# Patient Record
Sex: Male | Born: 1940
Health system: Southern US, Community
[De-identification: ages and names within clinical notes are randomized; demographics above are authoritative.]

## PROBLEM LIST (undated history)

## (undated) ENCOUNTER — Emergency Department (HOSPITAL_COMMUNITY): Disposition: A | Discharge status: Other Acute Inpatient Hospital | Payer: Medicare Other

## (undated) DIAGNOSIS — I714 Abdominal aortic aneurysm, without rupture, unspecified: Secondary | ICD-10-CM

## (undated) DIAGNOSIS — I10 Essential (primary) hypertension: Secondary | ICD-10-CM

## (undated) DIAGNOSIS — I2510 Atherosclerotic heart disease of native coronary artery without angina pectoris: Secondary | ICD-10-CM

## (undated) DIAGNOSIS — R51 Headache: Secondary | ICD-10-CM

## (undated) DIAGNOSIS — G8929 Other chronic pain: Secondary | ICD-10-CM

## (undated) DIAGNOSIS — R519 Headache, unspecified: Secondary | ICD-10-CM

## (undated) DIAGNOSIS — T50905A Adverse effect of unspecified drugs, medicaments and biological substances, initial encounter: Secondary | ICD-10-CM

## (undated) DIAGNOSIS — G4733 Obstructive sleep apnea (adult) (pediatric): Secondary | ICD-10-CM

## (undated) DIAGNOSIS — E785 Hyperlipidemia, unspecified: Secondary | ICD-10-CM

## (undated) DIAGNOSIS — Z9989 Dependence on other enabling machines and devices: Secondary | ICD-10-CM

## (undated) DIAGNOSIS — R001 Bradycardia, unspecified: Secondary | ICD-10-CM

## (undated) DIAGNOSIS — J449 Chronic obstructive pulmonary disease, unspecified: Secondary | ICD-10-CM

## (undated) DIAGNOSIS — M549 Dorsalgia, unspecified: Secondary | ICD-10-CM

## (undated) DIAGNOSIS — I77810 Thoracic aortic ectasia: Secondary | ICD-10-CM

## (undated) DIAGNOSIS — C329 Malignant neoplasm of larynx, unspecified: Secondary | ICD-10-CM

## (undated) DIAGNOSIS — B009 Herpesviral infection, unspecified: Secondary | ICD-10-CM

## (undated) DIAGNOSIS — K219 Gastro-esophageal reflux disease without esophagitis: Secondary | ICD-10-CM

## (undated) DIAGNOSIS — M1990 Unspecified osteoarthritis, unspecified site: Secondary | ICD-10-CM

## (undated) HISTORY — PX: CARDIAC CATHETERIZATION: SHX172

## (undated) HISTORY — PX: SHOULDER ARTHROSCOPY W/ ROTATOR CUFF REPAIR: SHX2400

## (undated) HISTORY — DX: Abdominal aortic aneurysm, without rupture: I71.4

## (undated) HISTORY — DX: Abdominal aortic aneurysm, without rupture, unspecified: I71.40

## (undated) HISTORY — DX: Chronic obstructive pulmonary disease, unspecified: J44.9

## (undated) HISTORY — PX: COLONOSCOPY W/ BIOPSIES AND POLYPECTOMY: SHX1376

---

## 1958-10-01 HISTORY — PX: RHINOPLASTY: SUR1284

## 1958-10-01 HISTORY — PX: PATELLA FRACTURE SURGERY: SHX735

## 1996-10-01 DIAGNOSIS — C329 Malignant neoplasm of larynx, unspecified: Secondary | ICD-10-CM

## 1996-10-01 HISTORY — DX: Malignant neoplasm of larynx, unspecified: C32.9

## 2000-12-18 ENCOUNTER — Ambulatory Visit (HOSPITAL_BASED_OUTPATIENT_CLINIC_OR_DEPARTMENT_OTHER): Admission: RE | Admit: 2000-12-18 | Discharge: 2000-12-19 | Payer: Self-pay | Admitting: Orthopedic Surgery

## 2001-02-19 ENCOUNTER — Encounter: Payer: Self-pay | Admitting: Orthopedic Surgery

## 2001-02-19 ENCOUNTER — Encounter: Admission: RE | Admit: 2001-02-19 | Discharge: 2001-02-19 | Payer: Self-pay | Admitting: Orthopedic Surgery

## 2002-07-07 ENCOUNTER — Encounter: Payer: Self-pay | Admitting: Otolaryngology

## 2002-07-07 ENCOUNTER — Ambulatory Visit (HOSPITAL_COMMUNITY): Admission: RE | Admit: 2002-07-07 | Discharge: 2002-07-07 | Payer: Self-pay | Admitting: Otolaryngology

## 2007-03-14 ENCOUNTER — Inpatient Hospital Stay (HOSPITAL_COMMUNITY): Admission: EM | Admit: 2007-03-14 | Discharge: 2007-03-19 | Payer: Self-pay | Admitting: Emergency Medicine

## 2011-02-13 NOTE — Cardiovascular Report (Signed)
NAMEKHOURY, SIEMON               ACCOUNT NO.:  [id number]   MEDICAL RECORD NO.:  [id number]          PATIENT TYPE:  INP   LOCATION:  6531                         FACILITY:  [facility]   PHYSICIAN:  Cristy Hilts. Jacinto Halim, MD       DATE OF BIRTH:  [birth year]   DATE OF PROCEDURE:  03/17/2007  DATE OF DISCHARGE:                            CARDIAC CATHETERIZATION   PROCEDURE PERFORMED:  1. Left ventriculography.  2. Selective left coronary aortography.  3. Ascending aortogram.  4. Abdominal aortogram.  5. PTCA and balloon angioplasty of the distal circumflex coronary      artery.   INDICATIONS:  Mr. Escamilla is a 70 year old gentleman with no significant  prior cardiovascular history and who has history of hypertension,  hyperlipidemia and borderline diabetes, was admitted to the hospital  with chest pain suggestive of unstable angina.  Given this, he was  brought to the cardiac catheterization lab to evaluate his coronary  anatomy.  He has recently undergone a stress Myoview on the outpatient  basis, and this did not reveal any ischemia.   Ascending aortogram was performed, because of suspicion for aortic root  dilatation and a ascending aortic aneurysm.  In a similar fashion,  abdominal aortogram was performed for evaluation of abdominal aortic  aneurysm, given dilated aortic root.   HEMODYNAMIC DATA:  The left ventricular pressure 115/0 with the end-  diastolic 13 mmHg.  The aortic pressure 114/52 with a mean of 80 mmHg.  There was no pressure gradient across the aortic valve.   Right coronary artery is a large-caliber vessel in the proximal segment.  However, it rapidly tapers down in the mid-to-distal segment.  There is  a mildly calcific 50 to at most 60% stenosis in the mid right coronary  artery.  The right coronary artery continued as a PDA.    LEFT MAIN:  Left main is short and is normal.   CIRCUMFLEX:  Circumflex is a very large caliber vessel.  Gives origin to  a large AV groove  branch and a small-to-moderate sized OM1 and a  moderate-to-large size OM-2.  There is a 99% stenosis noted at the  junction of the obtuse marginal 2 and distal circumflex coronary artery.  The obtuse marginal 2 has a ostial 70% stenosis.   LAD:  LAD is a large caliber vessel.  Gives origin to a small diagonal  1.  After the origin of the diagonal one, the LAD is diffusely diseased  with mild to moderate amount of luminal irregularity, constituting 50%  stenosis.  In mid-segment, it gives origin to a very large diagonal 2.  Just after the origin of the diagonal 2, there is a eccentric 50 to at  most 60% stenosis in the LAD.   ASCENDING AORTOGRAM:  Ascending aortogram revealed presence of three  aortic valve cusps.  There was no significant aortic regurgitation.  There is no evidence of ascending aortic aneurysm; however, the aortic  root was mildly dilated.   ABDOMINAL  AORTOGRAM:  Abdominal aortogram revealed presence of 2 renal  arteries, one on either sides.  They were widely  patent.  There was no  evidence of abdominal aortic aneurysm.  The aorto-iliac bifurcation was  widely patent.   INTERVENTION DATA:  Successful PTCA and balloon angioplasty of the  distal circumflex coronary artery with the utilization of a 3.25 x 15-mm  Dura Star balloon at 8 atmospheres of pressures x2.  The stenosis was  reduced from 99% to less than 10% with TIMI III to TIMI III flow  maintained at the end of the procedure, without any evidence of  dissection or thrombus.  The obtuse marginal 2 stenosis which was 70% to  begin with, had increased to around 85-90%.  However, there was  excellent flow noted through this obtuse marginal 2 branch.   RECOMMENDATIONS:  The patient was left with the balloon angioplasty  results alone, given the fact there was a large obtuse marginal 2 branch  which could potentially be compromised.  There was excellent brisk flow  noted through the balloon angioplasty site.    I suspect he will need aspirin and Plavix for at least a period of 1  year for unstable angina.  Also, there is a question of bradycardia.  If  this persists, he may need a permanent transvenous pacemaker  implantation.   He recently has had a stress test, and given the fact that he has  borderline lesions in the mid-RCA and also in the LAD, and given the  fact he had high-grade stenosis in the circumflex, there could have been  a balanced ischemia.  Hence, the stress test may have been negative.  However, now having fixed the major lesion in the circumflex, could  consider repeating a outpatient Myoview to evaluate the significance of  right coronary artery and a left anterior descending artery stenosis.  Also, if the chest pain persists, consideration can be given for  angioplasty to the LAD.   A total of 190 mL of contrast was utilized for diagnostic and  interventional procedure.   PROCEDURE:  1. Under usual sterile precaution using a 6-French right femoral      arterial access, a 6-French multipurpose B2 #2 catheter was      advanced into the ascending aorta over a J-wire and into the left      ventricle.  Left ventriculography was performed, both in the LAO      and RAO projection, and then the same catheter was pulled into the      ascending aorta and right coronary artery and then the left main      coronary artery and selective engaged angiography was performed.      The catheter was then pulled into the root,and the ascending      aortogram was performed in LAO projection, then the catheter was      pulled back in the abdominal aorta and abdominal aortogram was      performed.   TECHNIQUE INFORMATION:  Exchanging the 6-French sheath to a 7-French  sheath, a 7-French FL-4.5 guide was advanced into the ascending aorta  over the J-wire, and left main coronary artery was selective engaged. Using a ATW marker guidewire, which was advanced into the circumflex  coronary artery,  using Angiomax for anticoagulation.  Lesion length was  carefully measured.  A 3.25 x 50-mm Durastar balloon was utilized, and  initially 3-atmospheric balloon angioplasty was performed for 30  seconds, as the balloon was occlusive to the site of the lesion.  Then 2  more dilatations at 8 atmospheric pressure x2, one for 60  seconds and  the other one for 30 seconds was performed, and a nitroglycerin was  administered.  Angiography was performed.  Excellent results were noted.  There was a snowplow and watermelon seed effect into the obtuse marginal  2 branch with increasing the stenosis from 70% to 80-90%.  However,  there was excellent flow noted.  The lesion was carefully analyzed and  felt that this could probably be left alone.  I did discuss the findings  with Dr. Elsie Lincoln, who also agreed with my findings.  The  patient was then transferred to the recovery in stable condition, after  taking the guide catheter and the guide wire out.  I started the  Integralin to give adequate anti-platelet effect, given the fact that  this was only a balloon angioplasty result.  No immediate complications  were noted.      Cristy Hilts. Jacinto Halim, MD  Electronically Signed     JRG/MEDQ  D:  03/17/2007  T:  03/17/2007  Job:  [id number]   cc:   Christeen Douglas, MD, [facility]  [name], M.D.
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
## 2011-02-13 NOTE — Discharge Summary (Signed)
[name], [name]               ACCOUNT NO.:  [id number]   MEDICAL RECORD NO.:  [id number]          PATIENT TYPE:  INP   LOCATION:  6531                         FACILITY:  [facility]   PHYSICIAN:  [name], M.D.DATE OF BIRTH:  September 16, 1941   DATE OF ADMISSION:  03/14/2007  DATE OF DISCHARGE:  03/19/2007                               DISCHARGE SUMMARY   DISCHARGE DIAGNOSES:  1. Unstable angina pectoris status post angioplasty to circumflex.      The patient still has residual disease in the right coronary artery      and left anterior descending.  Needs outpatient Myoview stress test      to assess the myocardial perfusion.  2. Hypertension.  3. Hyperlipidemia.  4. Gastroesophageal reflux disease.  5. History of laryngeal carcinoma treated with radiation therapy.   This is a 70 year old Caucasian gentleman with previous history of  coronary artery disease who has been followed by Dr. Alanda Smith in our  office, and he presented to the emergency room at [facility]  with complaints of chest pain.  Initially, the pain started as a  bilateral shoulder discomfort which was exertional while the patient was  working in the yard.  He stopped the activity, came back home and took  hydrocodone that relieved the discomfort.  Later the same night, the  patient experienced another episode of bilateral shoulder pain which  occurred at rest.  He took another hydrocodone and went to bed, but  could not sleep well through the night.  In the early morning, he was  awakened from sleep with chest pain, and at this time the pain radiated  to the shoulders and he was really short of breath and sweaty and weak.  The patient asked his wife to take him to the emergency room and we saw  him there.  We cycled his enzymes which were negative x3, and the  patient was scheduled for the coronary angiography on Monday, March 17, 2007.  The cath revealed a three vessel lesion and distal circumflex  had  99% stenosis.  Dr. Jacinto Smith performed the angioplasty with reduction of the  lesion to less than 10%.  The patient tolerated the procedure well and  was assessed the next day by Dr. Jacinto Smith who decided to keep him one more  day for observation because the patient developed an episode of  bradycardia of unknown etiology.  There were no more arrhythmias  observed on telemetry, and the following day the patient was discharged  home in stable condition.   HOSPITAL LABORATORIES:  Hemoglobin of 13.3, hematocrit 59.9, white blood  cell count 5.9, platelets 281.  Sodium 133, potassium 4.2, chloride 100,  CO2 of 26, BUN 12, creatinine 0.89, glucose 107.  Cardiac enzymes were  negative x3.   DISCHARGE INSTRUCTIONS:  1. Low-fat, low-salt, low-cholesterol diet.  2  Keep groin site dry, clean.  Report any problems with groin puncture  site.   DISCHARGE MEDICATIONS:  1. Diovan 80 mg daily.  2. Protonix 40 mg or Prilosec 20 mg daily.  3. Aspirin 325 mg daily.  4.  Potassium 20 mEq daily.  5. Singulair 10 mg daily.  6. Ativan 1 mg daily.  7. Plavix 75 mg daily.  8. Nitroglycerin sublingual as needed for chest pain.  9. Norvasc 5 mg daily.  (The patient was instructed to discontinue hydrochlorothiazide.)   DISCHARGE FOLLOWUP:  He will be seen by Dr. Alanda Smith in our office on  March 27, 2007 at 9:15 and the recommendations will be made at that time.      [name], P.A.      [name], M.D.  Electronically Signed    MK/MEDQ  D:  03/19/2007  T:  03/19/2007  Job:  [id number]   cc:   [facility]

## 2011-02-13 NOTE — Discharge Summary (Signed)
NAMEMIKIAS, LANZ               ACCOUNT NO.:  [id number]   MEDICAL RECORD NO.:  [id number]          PATIENT TYPE:  INP   LOCATION:  6531                         FACILITY:  [facility]   PHYSICIAN:  [name], P.A. DATE OF BIRTH:  20-Jul-1941   DATE OF ADMISSION:  03/14/2007  DATE OF DISCHARGE:  03/19/2007                               DISCHARGE SUMMARY   DISCHARGE DIAGNOSES:  1. Unstable angina pectoris status post angioplasty to circumflex this      admission.  2. Non-coronary artery disease still with residual left anterior      descending and right coronary artery lesions, need to get patient's      Cardiolite stress test to rule out ischemia in those regions.  3. Hypertension.  4. Gastroesophageal reflux disease.  5. Hyperlipidemia.  6. Laryngeal carcinoma treated with radiation.   DICTATION ENDS HERE      [name], P.A.     MK/MEDQ  D:  03/19/2007  T:  03/19/2007  Job:  [id number]

## 2011-02-16 NOTE — Op Note (Signed)
. [facility]  Patient:    [name], [name]                        MRN: [id number] Proc. Date: 12/18/00 Adm. Date:  12/18/00 Attending:  Sharlot Gowda., M.D.                           Operative Report  INDICATIONS:  A 70 year old with severe shoulder pain, partial rotator cuff tear, AC joint arthritis impingement thought to be amenable to overnight hospitalization.  PREOPERATIVE DIAGNOSES: 1. Impingement with severe AC arthritis. 2. Partial rotator cuff tear. 3. Degenerative tear of anterosuperior labrum.  POSTOPERATIVE DIAGNOSES: 1. Impingement with severe AC arthritis. 2. Partial rotator cuff tear. 3. Degenerative tear of anterosuperior labrum.  PROCEDURE: 1. Arthroscopic acromioplasty. 2. Interarticular debridement torn labrum. 3. Open distal clavicle excision.  SURGEON:  Sharlot Gowda., M.D.  ASSISTANT:  Arnoldo Morale, P.A.  DESCRIPTION OF PROCEDURE:  Arthroscope through posterolateral and anterior portals.  Inspection of the shoulder showed the patient to have no glenohumeral arthritis.  He had a partial thickness tear encroachment but not quite 50% of the thickness of the supraspinatus was debrided.  He had degenerative tearing of the labrum which was debrided intra-articularly.  The subacromial space was inflamed.  The acromion demonstrated moderately severe curvature.  Approximately 7-8 mm of bone was resected from the anterior ______ of the acromion and gently ______ posteriorly.  Preoperative evaluation and x-ray showed severe hypertrophic changes of the [facility] joint, point in fact, there was significant anterior and superior projections of the clavicle and it was my thought that it would be difficult to resect the bone, because a portion of the bone actually projected significantly in front of the acromion as well as superior to the top of the acromion.  For this reason the procedure was converted to an open procedure  and due to the extreme hypertrophy of the [facility] joint incision was made over about 5 cm.  The deltoid fascia was carefully dissected longitudinally at deltotrapezial fascia revealing a extremely hypertrophied AC joint, point in fact, there was almost a slope to it with osteolysis on the inferior portion of the clavicle and hypertrophy superiorly.  The bone was resected.  Small osteophytes were removed from the undersurface of the clavicle but the deltoid did not have to be attached from the anterior aspect of the acromion.  The distal clavicle was excised followed by irrigation, closure with 0 Tycron of the deltotrapezial fascia, 2-0 Vicryl on the subcutaneous tissues and skin clips on the skin.  10 cc of Marcaine infiltrated into the skin, lightly compressive sterile dressing and sling applied and taken to recovery room in stable condition.  y DD:  12/18/00 TD:  12/19/00 Job: [id number]

## 2011-07-18 LAB — CBC
HCT: 43.7
HCT: 44.1
HCT: 45.2
Hemoglobin: 13.3
Hemoglobin: 14.7
Hemoglobin: 14.9
Hemoglobin: 15
Hemoglobin: 15.1
MCHC: 33.4
MCHC: 33.4
MCHC: 33.5
MCHC: 33.7
MCV: 87.7
MCV: 87.8
Platelets: 316
Platelets: 316
RBC: 4.46
RBC: 4.98
RBC: 5.02
RBC: 5.13
RDW: 13.9
RDW: 14.1 — ABNORMAL HIGH
RDW: 14.3 — ABNORMAL HIGH
WBC: 4.9
WBC: 5.7
WBC: 6.7
WBC: 7.8

## 2011-07-18 LAB — BASIC METABOLIC PANEL
BUN: 11
BUN: 18
CO2: 27
CO2: 30
Calcium: 9.1
Calcium: 9.2
Calcium: 9.6
Chloride: 100
Chloride: 102
Creatinine, Ser: 0.94
Creatinine, Ser: 0.97
GFR calc Af Amer: >60
GFR calc Af Amer: >60
GFR calc Af Amer: >60
GFR calc non Af Amer: >60
GFR calc non Af Amer: >60
GFR calc non Af Amer: >60
Glucose, Bld: 100 — ABNORMAL HIGH
Glucose, Bld: 98
Potassium: 3.5
Potassium: 3.8
Potassium: 4.2
Sodium: 132 — ABNORMAL LOW
Sodium: 133 — ABNORMAL LOW
Sodium: 139

## 2011-07-18 LAB — CARDIAC PANEL(CRET KIN+CKTOT+MB+TROPI)
CK, MB: 2.3
CK, MB: 2.4
Relative Index: 1.8
Relative Index: INVALID
Total CK: 133
Total CK: 86
Troponin I: 0.04

## 2011-07-18 LAB — DIFFERENTIAL
Lymphocytes Relative: 37
Lymphs Abs: 1.8
Monocytes Absolute: 0.4
Monocytes Relative: 7
Neutro Abs: 2.7

## 2011-07-18 LAB — CK TOTAL AND CKMB (NOT AT ARMC)
CK, MB: 2
Total CK: 87

## 2011-07-18 LAB — TROPONIN I: Troponin I: 0.06

## 2011-07-18 LAB — PROTIME-INR
INR: 1
Prothrombin Time: 12.9

## 2011-07-18 LAB — APTT: aPTT: 46 — ABNORMAL HIGH

## 2011-07-19 LAB — COMPREHENSIVE METABOLIC PANEL WITH GFR
ALT: 9
AST: 19
Albumin: 3.5
Alkaline Phosphatase: 58
BUN: 10
CO2: 28
Calcium: 9.1
Chloride: 104
Creatinine, Ser: 0.85
GFR calc non Af Amer: >60
Glucose, Bld: 113 — ABNORMAL HIGH
Potassium: 4
Sodium: 136
Total Bilirubin: 1.2
Total Protein: 6

## 2011-07-19 LAB — URINALYSIS, ROUTINE W REFLEX MICROSCOPIC
Nitrite: NEGATIVE
Protein, ur: NEGATIVE
Specific Gravity, Urine: 1.012

## 2011-07-19 LAB — I-STAT 8, (EC8 V) (CONVERTED LAB)
Acid-base deficit: 1
BUN: 13
Bicarbonate: 26.8 — ABNORMAL HIGH
Chloride: 105
Glucose, Bld: 83
HCT: 43
Hemoglobin: 14.6
Operator id: 161631
Potassium: 4.1
Sodium: 138
TCO2: 29
pCO2, Ven: 57.1 — ABNORMAL HIGH
pH, Ven: 7.28

## 2011-07-19 LAB — CARDIAC PANEL(CRET KIN+CKTOT+MB+TROPI)
CK, MB: 2.5
CK, MB: 3.6
Relative Index: 2.2
Relative Index: 2.7 — ABNORMAL HIGH
Relative Index: 2.7 — ABNORMAL HIGH
Total CK: 133
Troponin I: 0.03
Troponin I: 0.04

## 2011-07-19 LAB — BASIC METABOLIC PANEL
GFR calc non Af Amer: >60
Potassium: 4.2
Sodium: 141

## 2011-07-19 LAB — LIPID PANEL
Cholesterol: 132
LDL Cholesterol: 77
Total CHOL/HDL Ratio: 3.7
Triglycerides: 94
VLDL: 19

## 2011-07-19 LAB — POCT CARDIAC MARKERS
CKMB, poc: 1.5
Myoglobin, poc: 81.5
Operator id: 161631
Troponin i, poc: <0.05

## 2011-07-19 LAB — CBC
HCT: 41.3
HCT: 42.1
Hemoglobin: 13.8
Hemoglobin: 13.9
MCHC: 32.8
MCV: 90
Platelets: 303
RBC: 4.63
RBC: 4.68
RDW: 14.6 — ABNORMAL HIGH
WBC: 5.5
WBC: 6.4

## 2011-07-19 LAB — POCT I-STAT CREATININE
Creatinine, Ser: 1
Operator id: 161631

## 2011-07-19 LAB — PROTIME-INR: INR: 1.1

## 2011-11-04 ENCOUNTER — Encounter (HOSPITAL_COMMUNITY): Payer: Self-pay | Admitting: *Deleted

## 2011-11-04 ENCOUNTER — Observation Stay (HOSPITAL_COMMUNITY)
Admission: AD | Admit: 2011-11-04 | Discharge: 2011-11-06 | Disposition: A | Discharge status: Home / Self Care | Payer: Medicare Other | Source: Other Acute Inpatient Hospital | Attending: Cardiovascular Disease | Admitting: Cardiovascular Disease

## 2011-11-04 ENCOUNTER — Other Ambulatory Visit: Payer: Self-pay

## 2011-11-04 DIAGNOSIS — E785 Hyperlipidemia, unspecified: Secondary | ICD-10-CM | POA: Diagnosis present

## 2011-11-04 DIAGNOSIS — M129 Arthropathy, unspecified: Secondary | ICD-10-CM | POA: Insufficient documentation

## 2011-11-04 DIAGNOSIS — R079 Chest pain, unspecified: Secondary | ICD-10-CM

## 2011-11-04 DIAGNOSIS — I2089 Other forms of angina pectoris: Secondary | ICD-10-CM | POA: Diagnosis present

## 2011-11-04 DIAGNOSIS — I10 Essential (primary) hypertension: Secondary | ICD-10-CM | POA: Diagnosis present

## 2011-11-04 DIAGNOSIS — I208 Other forms of angina pectoris: Secondary | ICD-10-CM | POA: Diagnosis present

## 2011-11-04 DIAGNOSIS — T50905A Adverse effect of unspecified drugs, medicaments and biological substances, initial encounter: Secondary | ICD-10-CM

## 2011-11-04 DIAGNOSIS — R001 Bradycardia, unspecified: Secondary | ICD-10-CM | POA: Diagnosis present

## 2011-11-04 DIAGNOSIS — I2510 Atherosclerotic heart disease of native coronary artery without angina pectoris: Secondary | ICD-10-CM | POA: Diagnosis present

## 2011-11-04 DIAGNOSIS — R748 Abnormal levels of other serum enzymes: Secondary | ICD-10-CM

## 2011-11-04 DIAGNOSIS — Z8521 Personal history of malignant neoplasm of larynx: Secondary | ICD-10-CM

## 2011-11-04 DIAGNOSIS — I498 Other specified cardiac arrhythmias: Secondary | ICD-10-CM | POA: Insufficient documentation

## 2011-11-04 DIAGNOSIS — G4730 Sleep apnea, unspecified: Secondary | ICD-10-CM | POA: Insufficient documentation

## 2011-11-04 DIAGNOSIS — Z9861 Coronary angioplasty status: Secondary | ICD-10-CM | POA: Insufficient documentation

## 2011-11-04 DIAGNOSIS — I77810 Thoracic aortic ectasia: Secondary | ICD-10-CM | POA: Diagnosis present

## 2011-11-04 DIAGNOSIS — K219 Gastro-esophageal reflux disease without esophagitis: Secondary | ICD-10-CM | POA: Insufficient documentation

## 2011-11-04 HISTORY — DX: Essential (primary) hypertension: I10

## 2011-11-04 HISTORY — DX: Gastro-esophageal reflux disease without esophagitis: K21.9

## 2011-11-04 HISTORY — DX: Hyperlipidemia, unspecified: E78.5

## 2011-11-04 HISTORY — DX: Bradycardia, unspecified: R00.1

## 2011-11-04 HISTORY — DX: Unspecified osteoarthritis, unspecified site: M19.90

## 2011-11-04 HISTORY — DX: Adverse effect of unspecified drugs, medicaments and biological substances, initial encounter: T50.905A

## 2011-11-04 HISTORY — DX: Thoracic aortic ectasia: I77.810

## 2011-11-04 LAB — CBC
HCT: 39.8 % (ref 39.0–52.0)
Hemoglobin: 13.5 g/dL (ref 13.0–17.0)
MCH: 29.7 pg (ref 26.0–34.0)
MCHC: 33.9 g/dL (ref 30.0–36.0)
RDW: 13.4 % (ref 11.5–15.5)

## 2011-11-04 LAB — COMPREHENSIVE METABOLIC PANEL
ALT: 13 U/L (ref 0–53)
AST: 13 U/L (ref 0–37)
Albumin: 3.4 g/dL — ABNORMAL LOW (ref 3.5–5.2)
Alkaline Phosphatase: 74 U/L (ref 39–117)
Chloride: 102 mEq/L (ref 96–112)
Creatinine, Ser: 0.9 mg/dL (ref 0.50–1.35)
Potassium: 3.8 mEq/L (ref 3.5–5.1)
Sodium: 140 mEq/L (ref 135–145)
Total Bilirubin: 0.6 mg/dL (ref 0.3–1.2)

## 2011-11-04 LAB — URINALYSIS, ROUTINE W REFLEX MICROSCOPIC
Bilirubin Urine: NEGATIVE
Ketones, ur: NEGATIVE mg/dL
Nitrite: NEGATIVE
pH: 6 (ref 5.0–8.0)

## 2011-11-04 LAB — PROTIME-INR: Prothrombin Time: 13.5 seconds (ref 11.6–15.2)

## 2011-11-04 LAB — MAGNESIUM: Magnesium: 2.1 mg/dL (ref 1.5–2.5)

## 2011-11-04 LAB — CARDIAC PANEL(CRET KIN+CKTOT+MB+TROPI)
CK, MB: 2.5 ng/mL (ref 0.3–4.0)
Relative Index: INVALID (ref 0.0–2.5)
Total CK: 76 U/L (ref 7–232)
Troponin I: <0.3 ng/mL (ref <0.30)

## 2011-11-04 LAB — CREATININE, SERUM
GFR calc Af Amer: >90 mL/min (ref >90)
GFR calc non Af Amer: 84 mL/min — ABNORMAL LOW (ref >90)

## 2011-11-04 MED ORDER — ENOXAPARIN SODIUM 100 MG/ML ~~LOC~~ SOLN
100.0000 mg | Freq: Two times a day (BID) | SUBCUTANEOUS | Status: DC
  Administered 2011-11-04: 100 mg via SUBCUTANEOUS
  Filled 2011-11-04 (×3): qty 1

## 2011-11-04 MED ORDER — ISOSORBIDE MONONITRATE 15 MG HALF TABLET
15.0000 mg | ORAL_TABLET | Freq: Every day | ORAL | Status: DC
  Administered 2011-11-05: 15 mg via ORAL
  Filled 2011-11-04 (×2): qty 1

## 2011-11-04 MED ORDER — FLUTICASONE PROPIONATE 50 MCG/ACT NA SUSP
2.0000 | Freq: Every day | NASAL | Status: DC
  Administered 2011-11-04 – 2011-11-06 (×2): 2 via NASAL
  Filled 2011-11-04: qty 16

## 2011-11-04 MED ORDER — ENOXAPARIN SODIUM 100 MG/ML ~~LOC~~ SOLN
100.0000 mg | Freq: Two times a day (BID) | SUBCUTANEOUS | Status: DC
  Filled 2011-11-04 (×2): qty 1

## 2011-11-04 MED ORDER — NITROGLYCERIN 0.4 MG SL SUBL
0.4000 mg | SUBLINGUAL_TABLET | SUBLINGUAL | Status: DC | PRN
  Administered 2011-11-04 (×2): 0.4 mg via SUBLINGUAL
  Filled 2011-11-04: qty 25

## 2011-11-04 MED ORDER — ASPIRIN 300 MG RE SUPP
300.0000 mg | RECTAL | Status: DC
  Filled 2011-11-04: qty 1

## 2011-11-04 MED ORDER — ASPIRIN 81 MG PO CHEW
162.0000 mg | CHEWABLE_TABLET | Freq: Once | ORAL | Status: AC
  Administered 2011-11-04: 162 mg via ORAL
  Filled 2011-11-04: qty 2

## 2011-11-04 MED ORDER — OLMESARTAN 10 MG HALF TABLET
10.0000 mg | ORAL_TABLET | Freq: Every day | ORAL | Status: DC
  Administered 2011-11-04 – 2011-11-06 (×3): 10 mg via ORAL
  Filled 2011-11-04 (×3): qty 1

## 2011-11-04 MED ORDER — ASPIRIN 81 MG PO CHEW: 324.0000 mg | CHEWABLE_TABLET | ORAL | Status: DC

## 2011-11-04 MED ORDER — ASPIRIN 81 MG PO CHEW
324.0000 mg | CHEWABLE_TABLET | ORAL | Status: AC
  Administered 2011-11-05: 324 mg via ORAL
  Filled 2011-11-04: qty 4

## 2011-11-04 MED ORDER — SODIUM CHLORIDE 0.9 % IV SOLN: 250.0000 mL | INTRAVENOUS | Status: DC | PRN

## 2011-11-04 MED ORDER — DIAZEPAM 5 MG PO TABS
5.0000 mg | ORAL_TABLET | ORAL | Status: AC
  Administered 2011-11-05: 5 mg via ORAL
  Filled 2011-11-04: qty 1

## 2011-11-04 MED ORDER — SODIUM CHLORIDE 0.9 % IV SOLN
1.0000 mL/kg/h | INTRAVENOUS | Status: DC
  Administered 2011-11-05: 1 mL/kg/h via INTRAVENOUS

## 2011-11-04 MED ORDER — SODIUM CHLORIDE 0.9 % IJ SOLN: 3.0000 mL | Freq: Two times a day (BID) | INTRAMUSCULAR | Status: DC

## 2011-11-04 MED ORDER — LORATADINE 10 MG PO TABS
10.0000 mg | ORAL_TABLET | Freq: Every day | ORAL | Status: DC
  Filled 2011-11-04: qty 1

## 2011-11-04 MED ORDER — SODIUM CHLORIDE 0.9 % IJ SOLN: 3.0000 mL | INTRAMUSCULAR | Status: DC | PRN

## 2011-11-04 MED ORDER — SIMVASTATIN 40 MG PO TABS
40.0000 mg | ORAL_TABLET | Freq: Every day | ORAL | Status: DC
  Administered 2011-11-04 – 2011-11-05 (×2): 40 mg via ORAL
  Filled 2011-11-04 (×3): qty 1

## 2011-11-04 MED ORDER — ASPIRIN EC 325 MG PO TBEC
325.0000 mg | DELAYED_RELEASE_TABLET | Freq: Every day | ORAL | Status: DC
  Filled 2011-11-04 (×2): qty 1

## 2011-11-04 MED ORDER — ZOLPIDEM TARTRATE 5 MG PO TABS: 5.0000 mg | ORAL_TABLET | Freq: Every evening | ORAL | Status: DC | PRN

## 2011-11-04 MED ORDER — ASPIRIN 325 MG PO TABS
325.0000 mg | ORAL_TABLET | Freq: Every day | ORAL | Status: DC
  Filled 2011-11-04: qty 1

## 2011-11-04 MED ORDER — ASPIRIN EC 81 MG PO TBEC: 81.0000 mg | DELAYED_RELEASE_TABLET | Freq: Every day | ORAL | Status: DC

## 2011-11-04 MED ORDER — METOPROLOL TARTRATE 12.5 MG HALF TABLET
12.5000 mg | ORAL_TABLET | Freq: Two times a day (BID) | ORAL | Status: DC
  Administered 2011-11-04 – 2011-11-06 (×3): 12.5 mg via ORAL
  Filled 2011-11-04 (×5): qty 1

## 2011-11-04 MED ORDER — SODIUM CHLORIDE 0.9 % IJ SOLN
3.0000 mL | Freq: Two times a day (BID) | INTRAMUSCULAR | Status: DC
  Administered 2011-11-04: 3 mL via INTRAVENOUS

## 2011-11-04 MED ORDER — ONDANSETRON HCL 4 MG/2ML IJ SOLN: 4.0000 mg | Freq: Four times a day (QID) | INTRAMUSCULAR | Status: DC | PRN

## 2011-11-04 MED ORDER — LORATADINE 10 MG PO TABS
10.0000 mg | ORAL_TABLET | Freq: Every day | ORAL | Status: DC
  Administered 2011-11-05 – 2011-11-06 (×2): 10 mg via ORAL
  Filled 2011-11-04 (×3): qty 1

## 2011-11-04 MED ORDER — LORAZEPAM 1 MG PO TABS
1.0000 mg | ORAL_TABLET | Freq: Every day | ORAL | Status: DC | PRN
  Administered 2011-11-04: 1 mg via ORAL
  Filled 2011-11-04: qty 1

## 2011-11-04 MED ORDER — ACETAMINOPHEN 325 MG PO TABS
650.0000 mg | ORAL_TABLET | ORAL | Status: DC | PRN
  Administered 2011-11-04: 650 mg via ORAL
  Filled 2011-11-04: qty 2

## 2011-11-04 NOTE — H&P (Signed)
[name] is an 71 y.o. male.   Chief Complaint: chest pain HPI: 71 year old white, married male presented to [facility] 11/02/2011 with complaints of chest pain. He has known coronary artery disease with last cardiac catheterization 03/17/2007.  At that time he had had a negative nuclear stress test secondary to balanced ischemia. At cardiac cath he was found to have 50-60% RCA stenosis LAD was diffusely diseased with mild to moderate amount of luminal irregularity constituting 50% stenosis and after the origin of the diagonal 2 there was an eccentric 50-60% stenosis of the LAD. The circumflex was a very large caliber vessel and there was a 99% stenosis noted at the junction of the obtuse marginal 2 and distal circumflex artery with the obtuse marginal to have an ostial 70% stenosis. Patient underwent PTCA of the distal circumflex and the stenosis was reduced from 99% to less than 10% and obtuse marginal 2 stenosis which had been 70% head increased to 85-90% however there was excellent flow noted through this obtuse marginal 2 branch. Plans were to do an outpatient stress test now that the circumflex stenosis was resolved to see if that LAD stenosis was also ischemic. I will have to obtain those records from our office.  Also in 2008 at the time of the cardiac cath he had a dilated aortic root that was evaluated at cardiac cath. There was no evidence of ascending aortic aneurysm; however, the aortic root was mildly dilated.   Search this time patient has been followed at the [place] [facility]. He does not remember having any more stress test.   Patient does admit that over the last couple of years he will have chest pain at rest and will take sublingual nitroglycerin with relief of the pain. This may happen 2 or 3 times a month and then he may go to 3 months without any chest pain.    On 11/02/2011 patient stated he thought he was having a heart attack. He had a bilateral tightness to his chest, he  was lightheaded, with this his blood pressure had become very elevated with systolic  pressure in the 200s he felt very hot, he did have nausea but no shortness of breath and he never had diaphoresis. He was afraid to try his nitroglycerin because his blood pressure was very high.    His cardiac enzymes warm and mildly elevated and after evaluation by cardiologist Dr. Laney Potash it was recommended he have cardiac catheterization.  Currently here at [facility] he has no chest pain no shortness of breath.  Please note while he was at [facility] he was placed on beta blocker and his heart rate dropped down into the 40s, on old records from this hospital he also had bradycardia. Currently sinus rhythm without acute changes on EKG.   Past Medical History  Diagnosis Date  . Coronary artery disease   . Hypertension   . Angina   . Cancer 1998    throat  . GERD (gastroesophageal reflux disease)   . Arthritis   . Shortness of breath   . Sleep apnea     does not use machine as directed  . Hyperlipidemia   . Angina at rest 11/04/2011  . CAD (coronary artery disease), history of PTCA of LCX in 2008 and 50% stenosis of RCA at that time 11/04/2011  . HTN (hypertension) 11/04/2011  . Hyperlipemia 11/04/2011  . Bradycardia, drug induced 11/04/2011  . H/O laryngeal cancer, treated with radiation therapy 11/04/2011  . Aortic  root dilatation, history of in 2008 11/04/2011    Past Surgical History  Procedure Date  . Cardiac catheterization   . Patella fracture surgery     History reviewed. No pertinent family history. Social History:  reports that he quit smoking about 14 years ago. He does not have any smokeless tobacco history on file. He reports that he does not drink alcohol or use illicit drugs.he lives with his wife, is not very active secondary to chronic knee pain and lower back pain.  Allergies: No Known Allergies  Medications Prior to Admission  Medication Dose Route Frequency Provider Last Rate Last  Dose  . 0.9 %  sodium chloride infusion  250 mL Intravenous PRN Leone Brand, NP      . 0.9 %  sodium chloride infusion  1 mL/kg/hr Intravenous Continuous Leone Brand, NP      . acetaminophen (TYLENOL) tablet 650 mg  650 mg Oral Q4H PRN Leone Brand, NP      . aspirin chewable tablet 324 mg  324 mg Oral NOW Leone Brand, NP       Or  . aspirin suppository 300 mg  300 mg Rectal NOW Leone Brand, NP      . aspirin chewable tablet 324 mg  324 mg Oral Pre-Cath Leone Brand, NP      . aspirin EC tablet 81 mg  81 mg Oral Daily Leone Brand, NP      . diazepam (VALIUM) tablet 5 mg  5 mg Oral On Call Leone Brand, NP      . enoxaparin (LOVENOX) injection 100 mg  100 mg Subcutaneous Q12H Crystal Stillinger Merilynn Finland, PHARMD      . fluticasone (FLONASE) 50 MCG/ACT nasal spray 2 spray  2 spray Each Nare Daily Leone Brand, NP      . loratadine (CLARITIN) tablet 10 mg  10 mg Oral Daily Leone Brand, NP      . LORazepam (ATIVAN) tablet 1 mg  1 mg Oral Daily PRN Leone Brand, NP      . metoprolol tartrate (LOPRESSOR) tablet 12.5 mg  12.5 mg Oral BID Leone Brand, NP      . nitroGLYCERIN (NITROSTAT) SL tablet 0.4 mg  0.4 mg Sublingual Q5 Min x 3 PRN Leone Brand, NP      . olmesartan (BENICAR) tablet 10 mg  10 mg Oral Daily Leone Brand, NP      . ondansetron [facility]) injection 4 mg  4 mg Intravenous Q6H PRN Leone Brand, NP      . simvastatin (ZOCOR) tablet 40 mg  40 mg Oral q1800 Leone Brand, NP      . sodium chloride 0.9 % injection 3 mL  3 mL Intravenous Q12H Leone Brand, NP      . sodium chloride 0.9 % injection 3 mL  3 mL Intravenous PRN Leone Brand, NP      . zolpidem (AMBIEN) tablet 5 mg  5 mg Oral QHS PRN Leone Brand, NP      . DISCONTD: 0.9 %  sodium chloride infusion  250 mL Intravenous PRN Leone Brand, NP      . DISCONTD: aspirin EC tablet 325 mg  325 mg Oral Daily Leone Brand, NP      . DISCONTD: enoxaparin (LOVENOX) injection 100 mg  100 mg  Subcutaneous Q12H Leone Brand, NP      . DISCONTD: sodium  chloride 0.9 % injection 3 mL  3 mL Intravenous Q12H Leone Brand, NP      . DISCONTD: sodium chloride 0.9 % injection 3 mL  3 mL Intravenous PRN Leone Brand, NP       No current outpatient prescriptions on file as of 11/04/2011.   Outpatient Medications:  Aspirin 325 mg daily enteric-coated, Zyrtec 10 mg daily, Lodine 400 mg twice a day as needed for in near back pain, Flonase 50 mics 2 sprays into the nose daily, Ativan 1 mg daily as needed for sleep, and Diovan 80 mg daily. He may have other medications his wife will bring his home meds to the hospital Delaney Meigs and these will be reassessed.   Results for orders placed during the hospital encounter of 11/04/11 (from the past 48 hour(s))  COMPREHENSIVE METABOLIC PANEL     Status: Abnormal   Collection Time   11/04/11  3:00 PM      Component Value Range Comment   Sodium 140  135 - 145 (mEq/L)    Potassium 3.8  3.5 - 5.1 (mEq/L)    Chloride 102  96 - 112 (mEq/L)    CO2 29  19 - 32 (mEq/L)    Glucose, Bld 120 (*) 70 - 99 (mg/dL)    BUN 13  6 - 23 (mg/dL)    Creatinine, Ser 1.61  0.50 - 1.35 (mg/dL)    Calcium 9.5  8.4 - 10.5 (mg/dL)    Total Protein 6.5  6.0 - 8.3 (g/dL)    Albumin 3.4 (*) 3.5 - 5.2 (g/dL)    AST 13  0 - 37 (U/L)    ALT 13  0 - 53 (U/L)    Alkaline Phosphatase 74  39 - 117 (U/L)    Total Bilirubin 0.6  0.3 - 1.2 (mg/dL)    GFR calc non Af Amer 83 (*) >90 (mL/min)    GFR calc Af Amer >90  >90 (mL/min)   APTT     Status: Abnormal   Collection Time   11/04/11  3:00 PM      Component Value Range Comment   aPTT 45 (*) 24 - 37 (seconds)   PROTIME-INR     Status: Normal   Collection Time   11/04/11  3:00 PM      Component Value Range Comment   Prothrombin Time 13.5  11.6 - 15.2 (seconds)    INR 1.01  0.00 - 1.49    MAGNESIUM     Status: Normal   Collection Time   11/04/11  3:00 PM      Component Value Range Comment   Magnesium 2.1  1.5 - 2.5 (mg/dL)    No  results found.  ROS: General:acetylene treated for flulike symptoms and took a Z-Pak and cough syrup. This has improved Skin:no rashes or ulcers HEENT:no blurred vision or double vision, has a deviated septum and has difficulty breathing through his nose WR:UEAVW pain see history of present illness PUL:no shortness of breath GI:no diarrhea constipation or melena GU:no hematuria or dysuria MS:has knee joint pain and low back pain that keep him from being very active, no lower extremity edema Neuro:he was dizzy with this presentation but none prior to admission, no syncope Endo:no diabetes, no thyroid disease   Blood pressure 150/71, pulse 75, temperature 98.2 F (36.8 C), temperature source Oral, resp. rate 18, weight 96.7 kg (213 lb 3 oz), SpO2 93.00%. PE: General:alert oriented white male no acute distress, pleasant affect Skin:warm and dry,  brisk capillary refill HEENT:normocephalic sclera clear Neck:supple, no JVD no bruits. Heart:S1-S2 regular rate and rhythm without obvious murmur, gallop, rub or click Lungs:clear without rales rhonchi or wheezes. NWG:NFAO nontender positive bowel sounds do not palpate liver spleen or masses Ext:no edema 2+ pedal is bilaterally Neuro:alert and oriented x3, moves all extremities follows commands    Assessment/Plan Patient Active Problem List  Diagnoses  . Angina at rest  . CAD (coronary artery disease), history of PTCA only of LCX in 2008 and 50% stenosis of RCA at that time and 50% LAD,   . HTN (hypertension)  . Hyperlipemia  . Bradycardia, drug induced  . H/O laryngeal cancer, treated with radiation therapy  . Aortic root dilatation, history of in 2008   PLAN:aditted to tele, will need cardiac cath in AM to evaluate CAD.  Will continue betablocker at low dose, secondary to bradycardia will add parameters, con't lovnox, though will hold in am.  Continue imdur. And cardiac enzymes now and in AM.  INGOLD,LAURA R 11/04/2011, 4:28 PM   I  have seen and examined the patient along with [facility] R, NP.  I have reviewed the chart, notes and new data.  I agree with NP's note.  Key new complaints: Now angina free Key examination changes: No abnormalities on cardiovascular exam Key new findings / data: minimal cTnI elevation, no high risk ECG changes  PLAN: Presentation strongly suggestive of acute coronary syndrome in a patient with known CAD and at least intermediate lesions in all the major epicardial coronaries by 2008 cath.   Strongly recommend coronary angio. Risks and benefits of diagnostic cath and possible PCI were discussed in detail. He may have multivessel CAD, but he has received a loading dose of clopidogrel (600 mg) at [facility].  Thurmon Fair, MD, [facility] [phone] 11/04/2011, 6:07 PM

## 2011-11-05 ENCOUNTER — Encounter (HOSPITAL_COMMUNITY)
Admission: AD | Disposition: A | Discharge status: Home / Self Care | Payer: Self-pay | Source: Other Acute Inpatient Hospital | Attending: Cardiovascular Disease

## 2011-11-05 ENCOUNTER — Other Ambulatory Visit: Payer: Self-pay

## 2011-11-05 HISTORY — PX: LEFT HEART CATHETERIZATION WITH CORONARY ANGIOGRAM: SHX5451

## 2011-11-05 LAB — CBC
HCT: 41.3 % (ref 39.0–52.0)
Hemoglobin: 13.9 g/dL (ref 13.0–17.0)
MCH: 29.4 pg (ref 26.0–34.0)
MCHC: 33.7 g/dL (ref 30.0–36.0)
RDW: 13.5 % (ref 11.5–15.5)

## 2011-11-05 LAB — LIPID PANEL
Total CHOL/HDL Ratio: 2.6 RATIO
VLDL: 21 mg/dL (ref 0–40)

## 2011-11-05 LAB — BASIC METABOLIC PANEL
BUN: 14 mg/dL (ref 6–23)
Calcium: 9.6 mg/dL (ref 8.4–10.5)
GFR calc non Af Amer: 86 mL/min — ABNORMAL LOW (ref >90)
Glucose, Bld: 88 mg/dL (ref 70–99)
Sodium: 139 mEq/L (ref 135–145)

## 2011-11-05 LAB — CARDIAC PANEL(CRET KIN+CKTOT+MB+TROPI)
CK, MB: 2.7 ng/mL (ref 0.3–4.0)
Total CK: 68 U/L (ref 7–232)
Total CK: 74 U/L (ref 7–232)
Troponin I: <0.3 ng/mL (ref <0.30)

## 2011-11-05 SURGERY — LEFT HEART CATHETERIZATION WITH CORONARY ANGIOGRAM
Anesthesia: LOCAL

## 2011-11-05 MED ORDER — SODIUM CHLORIDE 0.9 % IJ SOLN: 3.0000 mL | INTRAMUSCULAR | Status: DC | PRN

## 2011-11-05 MED ORDER — HEPARIN (PORCINE) IN NACL 2-0.9 UNIT/ML-% IJ SOLN
INTRAMUSCULAR | Status: AC
  Filled 2011-11-05: qty 2000

## 2011-11-05 MED ORDER — FENTANYL CITRATE 0.05 MG/ML IJ SOLN
INTRAMUSCULAR | Status: AC
  Filled 2011-11-05: qty 2

## 2011-11-05 MED ORDER — SODIUM CHLORIDE 0.9 % IV SOLN: 250.0000 mL | INTRAVENOUS | Status: DC

## 2011-11-05 MED ORDER — ISOSORBIDE MONONITRATE ER 30 MG PO TB24
30.0000 mg | ORAL_TABLET | Freq: Every day | ORAL | Status: DC
  Administered 2011-11-06: 30 mg via ORAL
  Filled 2011-11-05: qty 1

## 2011-11-05 MED ORDER — MIDAZOLAM HCL 2 MG/2ML IJ SOLN
INTRAMUSCULAR | Status: AC
  Filled 2011-11-05: qty 2

## 2011-11-05 MED ORDER — NITROGLYCERIN 0.2 MG/ML ON CALL CATH LAB
INTRAVENOUS | Status: AC
  Filled 2011-11-05: qty 1

## 2011-11-05 MED ORDER — SODIUM CHLORIDE 0.9 % IV SOLN: 1.0000 mL/kg/h | INTRAVENOUS | Status: AC

## 2011-11-05 MED ORDER — LIDOCAINE HCL (PF) 1 % IJ SOLN
INTRAMUSCULAR | Status: AC
  Filled 2011-11-05: qty 30

## 2011-11-05 MED ORDER — VERAPAMIL HCL 2.5 MG/ML IV SOLN
INTRAVENOUS | Status: AC
  Filled 2011-11-05: qty 2

## 2011-11-05 MED ORDER — SODIUM CHLORIDE 0.9 % IJ SOLN
3.0000 mL | Freq: Two times a day (BID) | INTRAMUSCULAR | Status: DC
  Administered 2011-11-06: 3 mL via INTRAVENOUS

## 2011-11-05 MED ORDER — ONDANSETRON HCL 4 MG/2ML IJ SOLN: 4.0000 mg | Freq: Four times a day (QID) | INTRAMUSCULAR | Status: DC | PRN

## 2011-11-05 NOTE — Brief Op Note (Addendum)
11/04/2011 - 11/05/2011  4:01 PM  PATIENT:  [name]  71 y.o. male who presented to [facility] 11/02/2011 with complaints of chest pain. He has known coronary artery disease with last cardiac catheterization 03/17/2007. At that time he had had a negative nuclear stress test secondary to balanced ischemia. At cardiac cath he was found to have 50-60% RCA stenosis LAD was diffusely diseased with mild to moderate amount of luminal irregularity constituting 50% stenosis and after the origin of the diagonal 2 there was an eccentric 50-60% stenosis of the LAD. The circumflex was a very large caliber vessel and there was a 99% stenosis noted at the junction of the obtuse marginal 2 and distal circumflex artery with the obtuse marginal to have an ostial 70% stenosis. Patient underwent PTCA of the distal circumflex and the stenosis was reduced from 99% to less than 10% and obtuse marginal 2 stenosis which had been 70% head increased to 85-90% however there was excellent flow noted through this obtuse marginal 2 branch. Plans were to do an outpatient stress test now that the circumflex stenosis was resolved to see if that LAD stenosis was also ischemic.   Also in 2008 at the time of the cardiac cath he had a dilated aortic root that was evaluated at cardiac cath. There was no evidence of ascending aortic aneurysm; however, the aortic root was mildly dilated.  Search this time patient has been followed at the [place] [facility]. He does not remember having any more stress test.   Patient does admit that over the last couple of years he will have chest pain at rest and will take sublingual nitroglycerin with relief of the pain. This may happen 2 or 3 times a month and then he may go to 3 months without any chest pain.  On 11/02/2011 patient stated he thought he was having a heart attack. He had a bilateral tightness to his chest, he was lightheaded, with this his blood pressure had become very elevated with systolic  pressure in the 200s he felt very hot, he did have nausea but no shortness of breath and he never had diaphoresis. He was afraid to try his nitroglycerin because his blood pressure was very high. His cardiac enzymes warm and mildly elevated and after evaluation by cardiologist Dr. Laney Potash it was recommended he have cardiac catheterization.  Currently here at [facility] he has one episode of chest pain resolved with SL NTG.  PRE-OPERATIVE DIAGNOSIS:  CHEST PAIN, KNOWN CAD  History of known CAD in all 3 major coronary arteries, s/p POBA of LCx-OM2-3 bifurcation  POST-OPERATIVE DIAGNOSIS:   Dilated Aortic root with near lateral aortic ostium  LM - short, ostial ~20%  LAD - Large caliber; prox tubular ~50% with a mid ~50% focal lesion after major D2; proximal D1 is small, D2 is a moderate to large vessel, no disease; the LAD stenoses are similar to 2008 cath.  LCx -- Large caliber - gives rise to moderate caliber AVGroove branch that is moderate caliber and bifurcates into 2 LPL branches; The main Circ gives off OM1 just after AVGroove branch, then continues on to bifurcate into OM2 before terminating into OM3; the previous POBA site at the OM2-3 bifurcation is patent with ~10-20% residual & the OM2 ostium is free of disease; the remainder of the Circ system has only minimal luminal iregularities.  RCA - Large caliber, dominant vessel. Mid vessel involving the RV Marginal branch ostium, there is a ~50% tubular stenosis similar  to that seen in 2008.  Large RPDA with smal RPL system (not-unexpected with LPL system)  LV Gram: RAO - EF 55%, no WMA  Central Aortic Pressure/Mean: 132/57 mmHg / 10 mmHg  LV Pressure / LVEDP: 132/53 mmHg ; 83 mmHg  Procedure(s): LEFT HEART CATHETERIZATION WITH CORONARY ANGIOGRAM - via 5Fr R Radial Artery access  Surgeon(s): Marykay Lex, MD  ANESTHESIA:   local and IV sedation;  LOCAL MEDICATIONS USED:  LIDOCAINE 2 ml MEDICATIONS:  Sedation: 1 mg Versed,  25 mcg Fentanyl  Omnipaque contrast: 141 ml  Radial Cocktail:  (10 ml) 5 mg Verapamil, 400 mcg NTG, 2 ml 2% Lidocaine  EBL:    < 10ml   SHEATH: 5Fr Right Radial Artery --> TIG 4.0 (unsuccessful) --> 5Fr JR4 - RCA --> 5Fr JL4 (LCA), Used straight wire & JR4 to cross Aortic Valve; exchanged for angled Pigtail --> LVGram & hemodynamics, then pull back.  TR Band: 14ml air @ 1545; initial reverse Allens' negative - but subsequently normal indicating non-occlusive hemostasis.  DICTATION: .Note written in EPIC  PLAN OF CARE:  Admit for overnight observation  As no flow limiting lesion noted - consider potential for non-cardiac pain vs. Spasm as this was relieved by NTG. --> Increase Imdur,   Will monitor overnight to ensure no recurrence of CP @ rest or exercise.  PATIENT DISPOSITION:  PACU - hemodynamically stable.   Marykay Lex, M.D., M.S. [facility] [address] [place], [place]  [address]  [phone]  11/05/2011 4:21 PM

## 2011-11-05 NOTE — Progress Notes (Signed)
[facility]  Subjective: Chest pain(pressure across his chest) last night at ~2100hrs.  relieved with two SL nitro.  Objective: Vital signs in last 24 hours: Temp:  [97.5 F (36.4 C)-98.8 F (37.1 C)] 97.5 F (36.4 C) (02/04 0423) Pulse Rate:  [52-83] 52  (02/04 0423) Resp:  [18] 18  (02/04 0423) BP: (114-175)/(66-84) 126/71 mmHg (02/04 0423) SpO2:  [93 %-94 %] 93 % (02/04 0423) Weight:  [94.7 kg (208 lb 12.4 oz)-96.7 kg (213 lb 3 oz)] 94.7 kg (208 lb 12.4 oz) (02/04 0423) Last BM Date: 11/04/11  Intake/Output from previous day:   Intake/Output this shift:    Medications Current Facility-Administered Medications  Medication Dose Route Frequency Provider Last Rate Last Dose  . 0.9 %  sodium chloride infusion  250 mL Intravenous PRN Leone Brand, NP      . 0.9 %  sodium chloride infusion  1 mL/kg/hr Intravenous Continuous Leone Brand, NP 96.7 mL/hr at 11/05/11 0449 1 mL/kg/hr at 11/05/11 0449  . acetaminophen (TYLENOL) tablet 650 mg  650 mg Oral Q4H PRN Leone Brand, NP   650 mg at 11/04/11 2318  . aspirin chewable tablet 162 mg  162 mg Oral Once Leone Brand, NP   162 mg at 11/04/11 1731  . aspirin chewable tablet 324 mg  324 mg Oral Pre-Cath Leone Brand, NP   324 mg at 11/05/11 4098  . aspirin tablet 325 mg  325 mg Oral Daily Leone Brand, NP      . diazepam (VALIUM) tablet 5 mg  5 mg Oral On Call Leone Brand, NP      . fluticasone [facility]) 50 MCG/ACT nasal spray 2 spray  2 spray Each Nare Daily Leone Brand, NP   2 spray at 11/04/11 1728  . isosorbide mononitrate (IMDUR) 24 hr tablet 15 mg  15 mg Oral Daily Leone Brand, NP      . loratadine (CLARITIN) tablet 10 mg  10 mg Oral Daily Crystal [place], [place]      . LORazepam (ATIVAN) tablet 1 mg  1 mg Oral Daily PRN Leone Brand, NP   1 mg at 11/04/11 2326  . metoprolol tartrate (LOPRESSOR) tablet 12.5 mg  12.5 mg Oral BID Leone Brand, NP   12.5 mg at 11/04/11  2124  . nitroGLYCERIN (NITROSTAT) SL tablet 0.4 mg  0.4 mg Sublingual Q5 Min x 3 PRN Leone Brand, NP   0.4 mg at 11/04/11 2017  . olmesartan (BENICAR) tablet 10 mg  10 mg Oral Daily Leone Brand, NP   10 mg at 11/04/11 1734  . ondansetron (ZOFRAN) injection 4 mg  4 mg Intravenous Q6H PRN Leone Brand, NP      . simvastatin (ZOCOR) tablet 40 mg  40 mg Oral q1800 Leone Brand, NP   40 mg at 11/04/11 1734  . sodium chloride 0.9 % injection 3 mL  3 mL Intravenous Q12H Leone Brand, NP   3 mL at 11/04/11 2125  . sodium chloride 0.9 % injection 3 mL  3 mL Intravenous PRN Leone Brand, NP      . zolpidem (AMBIEN) tablet 5 mg  5 mg Oral QHS PRN Leone Brand, NP      . DISCONTD: 0.9 %  sodium chloride infusion  250 mL Intravenous PRN Leone Brand, NP      . DISCONTD: aspirin chewable tablet 324 mg  324  mg Oral NOW Leone Brand, NP      . DISCONTD: aspirin EC tablet 325 mg  325 mg Oral Daily Leone Brand, NP      . DISCONTD: aspirin EC tablet 81 mg  81 mg Oral Daily Leone Brand, NP      . DISCONTD: aspirin suppository 300 mg  300 mg Rectal NOW Leone Brand, NP      . DISCONTD: enoxaparin (LOVENOX) injection 100 mg  100 mg Subcutaneous Q12H Leone Brand, NP      . DISCONTD: enoxaparin (LOVENOX) injection 100 mg  100 mg Subcutaneous Q12H Crystal [place], PHARMD   100 mg at 11/04/11 2123  . DISCONTD: loratadine (CLARITIN) tablet 10 mg  10 mg Oral Daily Leone Brand, NP      . DISCONTD: sodium chloride 0.9 % injection 3 mL  3 mL Intravenous Q12H Leone Brand, NP      . DISCONTD: sodium chloride 0.9 % injection 3 mL  3 mL Intravenous PRN Leone Brand, NP        PE: General appearance: alert, cooperative and no distress Lungs: clear to auscultation bilaterally Heart: regular rate and rhythm, S1, S2 normal, no murmur, click, rub or gallop Extremities: No LEE Pulses: 2+ and symmetric radials.  Lab Results:   Basename 11/05/11 0555 11/04/11 1600  WBC 5.2 4.8    HGB 13.9 13.5  HCT 41.3 39.8  PLT 235 215   BMET  Basename 11/05/11 0555 11/04/11 1600 11/04/11 1500  NA 139 -- 140  K 3.9 -- 3.8  CL 103 -- 102  CO2 29 -- 29  GLUCOSE 88 -- 120*  BUN 14 -- 13  CREATININE 0.83 0.87 0.90  CALCIUM 9.6 -- 9.5   PT/INR  Basename 11/04/11 1500  LABPROT 13.5  INR 1.01   Cholesterol  Basename 11/05/11 0555  CHOL 106   Cardiac Enzymes Cardiac enzymes negative times three   Assessment/Plan  Active Problems:  Angina at rest  CAD (coronary artery disease), history of PTCA only of LCX in 2008 and 50% stenosis of RCA at that time and 50% LAD,   HTN (hypertension)  Hyperlipemia  Bradycardia, drug induced  H/O laryngeal cancer, treated with radiation therapy  Aortic root dilatation, history of in 2008  Plan:  [place].  Currently pain free.  EKG:  1st degree AV block.  CEs negative. Left heart cath planned for today.  ASA, BB, ARB, Imdur.  HR 52-83.  BP controlled this AM.  Cr 0.83.   LOS: 1 day    HAGER,BRYAN W 11/05/2011 9:38 AM  I have seen and examined the patient along with Wilburt Finlay, PA.  I have reviewed the chart, notes and new data.  I agree with Bryan's note.  Key new complaints: One episode of CP last pM - RELIEVED WITH ntg Key examination changes: Normal CN - but has slight R lip droop with smile due to scar tissue from Nasal fracture, clear lungs, cardiac exam benign  Key new findings / data: CE negative  PLAN: LHC +/- PCI today.  Risks of procedure as well as the alternatives and risks of each were explained to the (patient/caregiver).  Consent for procedure obtained. the patient.  Marykay Lex, M.D., M.S. [facility] [address] [place], [place]  [address]  [phone]  11/05/2011 1:56 PM

## 2011-11-06 MED ORDER — ISOSORBIDE MONONITRATE ER 30 MG PO TB24: 30.0000 mg | ORAL_TABLET | Freq: Every day | ORAL | Status: DC

## 2011-11-06 MED ORDER — METOPROLOL TARTRATE 12.5 MG HALF TABLET: 12.5000 mg | ORAL_TABLET | Freq: Two times a day (BID) | ORAL | Status: DC

## 2011-11-06 MED ORDER — NITROGLYCERIN 0.4 MG SL SUBL: 0.4000 mg | SUBLINGUAL_TABLET | SUBLINGUAL | Status: DC | PRN

## 2011-11-06 MED ORDER — SIMVASTATIN 40 MG PO TABS: 40.0000 mg | ORAL_TABLET | Freq: Every day | ORAL | Status: DC

## 2011-11-06 NOTE — Progress Notes (Signed)
UR Completed. Simmons, Ona Roehrs F [phone]  

## 2011-11-06 NOTE — Progress Notes (Signed)
[facility]  Subjective: No further CP.  No SOB  Objective: Vital signs in last 24 hours: Temp:  [97.3 F (36.3 C)-98.1 F (36.7 C)] 97.3 F (36.3 C) (02/05 0450) Pulse Rate:  [54-62] 54  (02/05 0450) Resp:  [18-19] 19  (02/05 0450) BP: (113-119)/(67-72) 113/67 mmHg (02/05 0450) SpO2:  [90 %-92 %] 90 % (02/05 0450) Weight:  [96 kg (211 lb 10.3 oz)] 96 kg (211 lb 10.3 oz) (02/05 0450) Last BM Date: 11/04/11  Intake/Output from previous day: 02/04 0701 - 02/05 0700 In: 240 [P.O.:240] Out: -  Intake/Output this shift:    Medications Current Facility-Administered Medications  Medication Dose Route Frequency Provider Last Rate Last Dose  . 0.9 %  sodium chloride infusion  1 mL/kg/hr Intravenous Continuous Marykay Lex, MD 94.7 mL/hr at 11/05/11 1645 1 mL/kg/hr at 11/05/11 1645  . 0.9 %  sodium chloride infusion  250 mL Intravenous Continuous Marykay Lex, MD      . diazepam (VALIUM) tablet 5 mg  5 mg Oral On Call Leone Brand, NP   5 mg at 11/05/11 1358  . fentaNYL (SUBLIMAZE) 0.05 MG/ML injection           . fluticasone (FLONASE) 50 MCG/ACT nasal spray 2 spray  2 spray Each Nare Daily Leone Brand, NP   2 spray at 11/04/11 1728  . heparin 2-0.9 UNIT/ML-% infusion           . isosorbide mononitrate (IMDUR) 24 hr tablet 30 mg  30 mg Oral Daily Marykay Lex, MD      . lidocaine (XYLOCAINE) 1 % injection           . loratadine (CLARITIN) tablet 10 mg  10 mg Oral Daily Crystal Dennis Port, PHARMD   10 mg at 11/05/11 1000  . metoprolol tartrate (LOPRESSOR) tablet 12.5 mg  12.5 mg Oral BID Leone Brand, NP   12.5 mg at 11/05/11 2139  . midazolam (VERSED) 2 MG/2ML injection           . nitroGLYCERIN (NITROSTAT) SL tablet 0.4 mg  0.4 mg Sublingual Q5 Min x 3 PRN Leone Brand, NP   0.4 mg at 11/04/11 2017  . nitroGLYCERIN (NTG ON-CALL) 0.2 mg/mL injection           . olmesartan (BENICAR) tablet 10 mg  10 mg Oral Daily Leone Brand, NP   10 mg at 11/05/11 1045  . ondansetron (ZOFRAN) injection 4 mg  4 mg Intravenous Q6H PRN Marykay Lex, MD      . simvastatin (ZOCOR) tablet 40 mg  40 mg Oral q1800 Leone Brand, NP   40 mg at 11/05/11 1800  . sodium chloride 0.9 % injection 3 mL  3 mL Intravenous Q12H Marykay Lex, MD      . sodium chloride 0.9 % injection 3 mL  3 mL Intravenous PRN Marykay Lex, MD      . verapamil (ISOPTIN) 2.5 MG/ML injection           . DISCONTD: 0.9 %  sodium chloride infusion  250 mL Intravenous PRN Leone Brand, NP      . DISCONTD: 0.9 %  sodium chloride infusion  1 mL/kg/hr Intravenous Continuous Leone Brand, NP 96.7 mL/hr at 11/05/11 0449 1 mL/kg/hr at 11/05/11 0449  . DISCONTD: acetaminophen (TYLENOL) tablet 650 mg  650 mg Oral Q4H PRN Leone Brand, NP   650 mg  at 11/04/11 2318  . DISCONTD: aspirin tablet 325 mg  325 mg Oral Daily Leone Brand, NP      . DISCONTD: isosorbide mononitrate (IMDUR) 24 hr tablet 15 mg  15 mg Oral Daily Leone Brand, NP   15 mg at 11/05/11 1000  . DISCONTD: LORazepam (ATIVAN) tablet 1 mg  1 mg Oral Daily PRN Leone Brand, NP   1 mg at 11/04/11 2326  . DISCONTD: ondansetron (ZOFRAN) injection 4 mg  4 mg Intravenous Q6H PRN Leone Brand, NP      . DISCONTD: sodium chloride 0.9 % injection 3 mL  3 mL Intravenous Q12H Leone Brand, NP   3 mL at 11/04/11 2125  . DISCONTD: sodium chloride 0.9 % injection 3 mL  3 mL Intravenous PRN Leone Brand, NP      . DISCONTD: zolpidem (AMBIEN) tablet 5 mg  5 mg Oral QHS PRN Leone Brand, NP        PE: General appearance: alert, cooperative and no distress Lungs: clear to auscultation bilaterally Heart: regular rate and rhythm, S1, S2 normal, no murmur, click, rub or gallop Extremities: No LEE Pulses: 2+ and symmetric radials, 1+DPS.  Lab Results:   Basename 11/05/11 0555 11/04/11 1600  WBC 5.2 4.8  HGB 13.9 13.5  HCT 41.3 39.8  PLT 235 215   BMET  Basename 11/05/11 0555 11/04/11 1600  11/04/11 1500  NA 139 -- 140  K 3.9 -- 3.8  CL 103 -- 102  CO2 29 -- 29  GLUCOSE 88 -- 120*  BUN 14 -- 13  CREATININE 0.83 0.87 0.90  CALCIUM 9.6 -- 9.5   PT/INR  Basename 11/04/11 1500  LABPROT 13.5  INR 1.01   Cholesterol  Basename 11/05/11 0555  CHOL 106    Studies/Results: POST-OPERATIVE DIAGNOSIS:  Dilated Aortic root with near lateral aortic ostium  LM - short, ostial ~20%  LAD - Large caliber; prox tubular ~50% with a mid ~50% focal lesion after major D2; proximal D1 is small, D2 is a moderate to large vessel, no disease; the LAD stenoses are similar to 2008 cath.  LCx -- Large caliber - gives rise to moderate caliber AVGroove branch that is moderate caliber and bifurcates into 2 LPL branches; The main Circ gives off OM1 just after AVGroove branch, then continues on to bifurcate into OM2 before terminating into OM3; the previous POBA site at the OM2-3 bifurcation is patent with ~10-20% residual & the OM2 ostium is free of disease; the remainder of the Circ system has only minimal luminal iregularities.  RCA - Large caliber, dominant vessel. Mid vessel involving the RV Marginal branch ostium, there is a ~50% tubular stenosis similar to that seen in 2008. Large RPDA with smal RPL system (not-unexpected with LPL system)  LV Gram: RAO - EF 55%, no WMA  Central Aortic Pressure/Mean: 132/57 mmHg / 10 mmHg  LV Pressure / LVEDP: 132/53 mmHg ; 83 mmHg Procedure(s):  LEFT HEART CATHETERIZATION WITH CORONARY ANGIOGRAM - via 5Fr R Radial Artery access  Surgeon(s):  Marykay Lex, MD  ANESTHESIA: local and IV sedation;  LOCAL MEDICATIONS USED: LIDOCAINE 2 ml  MEDICATIONS:  Sedation: 1 mg Versed, 25 mcg Fentanyl  Omnipaque contrast: 141 ml  Radial Cocktail: (10 ml) 5 mg Verapamil, 400 mcg NTG, 2 ml 2% Lidocaine  EBL: < 10ml  SHEATH: 5Fr Right Radial Artery --> TIG 4.0 (unsuccessful) --> 5Fr JR4 - RCA --> 5Fr JL4 (LCA), Used straight wire &  JR4 to cross Aortic Valve; exchanged  for angled Pigtail --> LVGram & hemodynamics, then pull back.  TR Band: 14ml air @ 1545; initial reverse Allens' negative - but subsequently normal indicating non-occlusive hemostasis.  DICTATION: .Note written in EPIC  PLAN OF CARE:  Admit for overnight observation  As no flow limiting lesion noted - consider potential for non-cardiac pain vs. Spasm as this was relieved by NTG. --> Increase Imdur,  Will monitor overnight to ensure no recurrence of CP @ rest or exercise.  Assessment/Plan  Active Problems:  Angina at rest  CAD (coronary artery disease), history of PTCA only of LCX in 2008 and 50% stenosis of RCA at that time and 50% LAD,   HTN (hypertension)  Hyperlipemia  Bradycardia, drug induced  H/O laryngeal cancer, treated with radiation therapy  Aortic root dilatation, history of in 2008  Plan:  S/P radial left heart cath with no flow limiting lesion.  Noncardiac CP or vasospasm.  None further. Imdur increased to 30mg  daily.  OK to DC home.   LOS: 2 days    HAGER,BRYAN W 11/06/2011 8:43 AM     Patient seen and examined. Agree with assessment and plan.  Agree with nitrates for mild CAD and spasm.  Plan DC today.   Lennette Bihari, MD, [facility] 11/06/2011 9:35 AM

## 2011-11-06 NOTE — Discharge Summary (Signed)
Physician Discharge Summary  Patient ID: [name] MRN: [id number] DOB/AGE: March 31, 1941 71 y.o.  Admit date: 11/04/2011 Discharge date: 11/06/2011  Discharge Diagnoses:  Active Problems:  Angina at rest  CAD (coronary artery disease), history of PTCA only of LCX in 2008 and 50% stenosis of RCA at that time and 50% LAD,   HTN (hypertension)  Hyperlipemia  Bradycardia, drug induced  H/O laryngeal cancer, treated with radiation therapy  Aortic root dilatation, history of in 2008  Discharged Condition: stable  Hospital Course:  70 year old white, married male presented to [facility] 11/02/2011 with complaints of chest pain. He has known coronary artery disease with last cardiac catheterization 03/17/2007. At that time he had had a negative nuclear stress test secondary to balanced ischemia. At cardiac cath he was found to have 50-60% RCA stenosis LAD was diffusely diseased with mild to moderate amount of luminal irregularity constituting 50% stenosis and after the origin of the diagonal 2.  There was an eccentric 50-60% stenosis of the LAD. The circumflex was a very large caliber vessel and there was a 99% stenosis noted at the junction of the obtuse marginal 2 and distal circumflex artery with the obtuse marginal to have an ostial 70% stenosis. Patient underwent PTCA of the distal circumflex and the stenosis was reduced from 99% to less than 10% and obtuse marginal 2 stenosis which had been 70% had increased to 85-90% however there was excellent flow noted through this obtuse marginal 2 branch. Plans were to do an outpatient stress test now that the circumflex stenosis was resolved to see if that LAD stenosis was also ischemic.  Also in 2008 at the time of the cardiac cath he had a dilated aortic root that was evaluated at cardiac cath. There was no evidence of ascending aortic aneurysm; however, the aortic root was mildly dilated.  Since this time, patient has been followed at the [place] [facility]. He does not remember having any more stress test.  Patient does admit that over the last couple of years he will have chest pain at rest and will take sublingual nitroglycerin with relief of the pain. This may happen 2 or 3 times a month and then he may go to 3 months without any chest pain.  On 11/02/2011 patient stated he thought he was having a heart attack. He had a bilateral tightness to his chest, he was lightheaded, with this his blood pressure had become very elevated with systolic pressure in the 200s he felt very hot, he did have nausea but no shortness of breath and he never had diaphoresis. He was afraid to try his nitroglycerin because his blood pressure was very high.  His cardiac enzymes were mildly elevated and after evaluation by cardiologist Dr. Laney Potash it was recommended he have cardiac catheterization.  Cardiac cath was completed on 11/05/11 and revealed no flow limiting lesion(See full report below).  CP is thought to be noncardiac or vasospasm. The patient's Imdur was increased to a 30mg  daily.  He will be discharged home in stable condition with OP follow-up.  He also has previously scheduled carotid dopplers on 11/23/11.    Significant Diagnostic Studies:  2.4.13, Left heat cath PRE-OPERATIVE DIAGNOSIS: CHEST PAIN, KNOWN CAD  History of known CAD in all 3 major coronary arteries, s/p POBA of LCx-OM2-3 bifurcation POST-OPERATIVE DIAGNOSIS:  Dilated Aortic root with near lateral aortic ostium  LM - short, ostial ~20%  LAD - Large caliber; prox tubular ~50% with a mid ~50% focal  lesion after major D2; proximal D1 is small, D2 is a moderate to large vessel, no disease; the LAD stenoses are similar to 2008 cath.  LCx -- Large caliber - gives rise to moderate caliber AVGroove branch that is moderate caliber and bifurcates into 2 LPL branches; The main Circ gives off OM1 just after AVGroove branch, then continues on to bifurcate into OM2 before terminating into OM3;  the previous POBA site at the OM2-3 bifurcation is patent with ~10-20% residual & the OM2 ostium is free of disease; the remainder of the Circ system has only minimal luminal iregularities.  RCA - Large caliber, dominant vessel. Mid vessel involving the RV Marginal branch ostium, there is a ~50% tubular stenosis similar to that seen in 2008. Large RPDA with smal RPL system (not-unexpected with LPL system)  LV Gram: RAO - EF 55%, no WMA  Central Aortic Pressure/Mean: 132/57 mmHg / 10 mmHg  LV Pressure / LVEDP: 132/53 mmHg ; 83 mmHg Procedure(s):  LEFT HEART CATHETERIZATION WITH CORONARY ANGIOGRAM - via 5Fr R Radial Artery access  Surgeon(s):  Marykay Lex, MD  ANESTHESIA: local and IV sedation;  LOCAL MEDICATIONS USED: LIDOCAINE 2 ml  MEDICATIONS:  Sedation: 1 mg Versed, 25 mcg Fentanyl  Omnipaque contrast: 141 ml  Radial Cocktail: (10 ml) 5 mg Verapamil, 400 mcg NTG, 2 ml 2% Lidocaine  EBL: < 10ml  SHEATH: 5Fr Right Radial Artery --> TIG 4.0 (unsuccessful) --> 5Fr JR4 - RCA --> 5Fr JL4 (LCA), Used straight wire & JR4 to cross Aortic Valve; exchanged for angled Pigtail --> LVGram & hemodynamics, then pull back.  TR Band: 14ml air @ 1545; initial reverse Allens' negative - but subsequently normal indicating non-occlusive hemostasis.  DICTATION: .Note written in EPIC  PLAN OF CARE:  Admit for overnight observation  As no flow limiting lesion noted - consider potential for non-cardiac pain vs. Spasm as this was relieved by NTG. --> Increase Imdur,  Will monitor overnight to ensure no recurrence of CP @ rest or exercise.  Discharge Exam: Blood pressure 113/67, pulse 68, temperature 97.3 F (36.3 C), temperature source Oral, resp. rate 19, height 5\' 7"  (1.702 m), weight 96 kg (211 lb 10.3 oz), SpO2 90.00%.   Disposition:   Discharge Orders    Future Orders Please Complete By Expires   Diet - low sodium heart healthy      Increase activity slowly      Discharge instructions       Comments:   No lifting for two days.     Medication List  As of 11/06/2011 11:11 AM   TAKE these medications         aspirin EC 325 MG tablet   Take 325 mg by mouth daily.      cetirizine 10 MG tablet   Commonly known as: ZYRTEC   Take 10 mg by mouth daily.      etodolac 400 MG tablet   Commonly known as: LODINE   Take 400 mg by mouth 2 (two) times daily as needed. For knee or back pain      fluticasone 50 MCG/ACT nasal spray   Commonly known as: FLONASE   Place 2 sprays into the nose daily.      isosorbide mononitrate 30 MG 24 hr tablet   Commonly known as: IMDUR   Take 1 tablet (30 mg total) by mouth daily.      LORazepam 1 MG tablet   Commonly known as: ATIVAN   Take 1 mg  by mouth daily as needed. For sleep      metoprolol tartrate 12.5 mg Tabs   Commonly known as: LOPRESSOR   Take 0.5 tablets (12.5 mg total) by mouth 2 (two) times daily.      nitroGLYCERIN 0.4 MG SL tablet   Commonly known as: NITROSTAT   Place 1 tablet (0.4 mg total) under the tongue every 5 (five) minutes x 3 doses as needed for chest pain (Take one tablet every five minutes if chest pain continues.  DO NOT take more than three(3) tablets.  If you still have chest pain after the third tablet, call 911!).      simvastatin 40 MG tablet   Commonly known as: ZOCOR   Take 1 tablet (40 mg total) by mouth daily at 6 PM.      valsartan 80 MG tablet   Commonly known as: DIOVAN   Take 80 mg by mouth daily.             SignedDwana Melena 11/06/2011, 11:11 AM

## 2011-11-06 NOTE — Progress Notes (Signed)
Pt. Discharged 11/06/2011  12:46 PM Discharge instructions reviewed with patient/family. Patient/family verbalized understanding. All Rx's given. Questions answered as needed. Pt. Discharged to home with family/self. Taken off unit via [address] [facility]

## 2011-11-09 HISTORY — PX: CARDIAC CATHETERIZATION: SHX172

## 2011-11-09 NOTE — Op Note (Signed)
[facility]     CARDIAC CATHETERIZATION REPORT  NAME: [name]   MRN: [id number] DOB: 1941/07/05   ADMIT DATE:  11/04/2011  Performing Cardiologist: Marykay Lex  Primary Physician: No primary provider on file. Primary Cardiologist:  Governor Rooks., MD  Procedures Performed:  Left Heart Catheterization via 5 Fr Right Radial Artery access  Left Ventriculography, (RAO) 11 ml/sec for 33 ml total contrast  Native Coronary Angiography  Indication(s): Chest pain at rest concerning for unstable angina  Known coronary disease of all 3 major coronary arteries; status post POBA of Left Circumflex-OM 2-OM 3 bifurcation  History: 71 y.o. male with a past medical history of known coronary artery disease status post POBA  of OM 2-OM 3 bifurcation in June of 2008 by Dr. Jacinto Halim. He is grossly stable with moderate coronary disease in the other vessels until he presented on February 1 [facility] hospital complaining of chest discomfort. He does does have a heart attack the bilateral tightness of the chest and lightheadedness. He is significantly elevated blood pressures and felt flushed but not short of breath and no diaphoresis.  He was seen by Dr. Laney Potash at [facility] to recommend a cardiac catheterization as his pain was relieved with nitroglycerin. He is now referred for invasive cardiac evaluation with cardiac catheterization.  Consent: The procedure with Risks/Benefits/Alternatives and Indications was reviewed with the patient (and family).  All questions were answered.    Risks / Complications include, but not limited to: Death, MI, CVA/TIA, VF/VT (with defibrillation), Bradycardia (need for temporary pacer placement), contrast induced nephropathy, bleeding / bruising / hematoma / pseudoaneurysm, vascular or coronary injury (with possible emergent CT or Vascular Surgery), adverse medication reactions, infection.    The patient and family voice  understanding and agree to proceed.    Consent for signed by MD and patient with RN witness -- placed on chart.  Procedure: The patient was brought to the 2nd Floor [facility] in the fasting state and prepped and draped in the usual sterile fashion for Right groin or radial access. A modified Allen's test with plethysmography was performed on the right wrist demonstrating adequate Ulnar Artery collateral flow.    Sterile technique was used including antiseptics, cap, gloves, gown, hand hygiene, mask and sheet.  Skin prep: Chlorhexidine;  Time Out: Verified patient identification, verified procedure, site/side was marked, verified correct patient position, special equipment/implants available, medications/allergies/relevent history reviewed, required imaging and test results available.  Performed  The right wrist was anesthetized with 1% subcutaneous Lidocaine.  The right radial artery was accessed using the Seldinger Technique with placement of a  5 Fr Glide Sheath. The sheath was aspirated and flushed.  Then a total of  10 ml of standard Radial Artery Cocktail (see medications) was infused.  Radial Cocktail: 5 mg Verapamil, 400 mcg NTG, 2 ml 2% Lidocaine  First, a  5 [place] TIG 4.0 Catheter was advanced of over a Versicore wire into the ascending Aorta.  After multiple attempts for both the left and right coronary artery this cath was unsuccessful in engaging in either artery.    The catheter was exchanged over a long change safety J-wire for a 5 [place] JR 4 catheter was used to engage the right coronary artery followed by a fibrous JL4 catheter these engage the left coronary artery.  Multiple cineangiographic views of the  right than the left coronary artery system(s) were performed.  This catheter was then exchanged over  the [employer] J wire for an angled Pigtail catheter that was advanced across the Aortic Valve.  LV hemodynamics were measured and Left  Ventriculography was performed.  LV hemodynamics were then re-sampled, and the catheter was pulled back across the Aortic Valve for measurement of "pull-back" gradient.  The catheter and the wire were removed completely out of the body.  The sheath was removed in the Cath Lab with a TR band placed at  14 ml Air at 1545 (time).  Reverse Allen's test revealed occlusive hemostasis - therefore the plan would be to begin deflation of the band and shorter course.  The patient was transported to the PACU in  hemodynamic stable, chest pain-freecondition.   The patient  was stable before, during and following the procedure.   Patient did tolerate procedure well. There were not complications. EBL:  <10 mL  Medications:  Sedation:   1 mg IV Versed,  25 mcg IV Fentanyl  Contrast:   140 ml Omnipaque  Radial Cocktail: 5 mg Verapamil, 400 mcg NTG, 2 ml 2% Lidocaine  IV Heparin: 4500 units  Hemodynamics:  Central Aortic Pressure / Mean Aortic Pressure:  132/57 mmHg / 10 mmHg  LV Pressure / LV End diastolic Pressure:  132/57 mmHg / 10 mmHg  Left Ventriculography:  EF:   55%   Wall Motion: normal  Coronary Angiographic Data:  Dilated Aortic root with near lateral aortic ostium   LM - short, ostial ~20%   LAD - Large caliber; prox tubular ~50% with a mid ~50% focal lesion after major D2; proximal D1 is small, D2 is a moderate to large vessel, no disease; the LAD stenoses are similar to 2008 cath.   LCx -- Large caliber - gives rise to moderate caliber AVGroove branch that is moderate caliber and bifurcates into 2 LPL branches; The main Circ gives off OM1 just after AVGroove branch, then continues on to bifurcate into OM2 before terminating into OM3; the previous POBA site at the OM2-3 bifurcation is patent with ~10-20% residual & the OM2 ostium is free of disease; the remainder of the Circ system has only minimal luminal iregularities.   RCA - Large caliber, dominant vessel. Mid vessel involving the  RV Marginal branch ostium, there is a ~50% tubular stenosis similar to that seen in 2008. Large RPDA with smal RPL system (not-unexpected with LPL system)  Impression:  Nonobstructive coronary artery disease no significant change from prior catheterization, with the exception of near stent-like appearance of the prior POBA site with no significant residual lesion in the OM 2 branch.  Well-preserved left ventricular function with no wall motion abnormality.  No clearcut etiology for the patient's chest pain noted, question possibility of coronary spasm versus esophageal spasm.  Plan:  Admit for overnight observation -- will monitor overnight to ensure no recurrence of CP @ rest or exercise.  As no flow limiting lesion noted - consider potential for non-cardiac pain vs. Spasm as this was relieved by NTG. --> Increase Imdur,   Anticipate discharge in the morning if stable.  The case and results was discussed with the patient (and family). The case and results was not discussed with the patient's PCP. The case and results was discussed with the patient's Cardiologist.  Time Spend Directly with Patient:  45 minutes  Delwyn Scoggin W, M.D., M.S. [facility] [address] [place], [place]  [address]  [phone]  11/09/2011 1:27 PM

## 2013-04-16 DIAGNOSIS — R51 Headache: Secondary | ICD-10-CM

## 2013-04-17 ENCOUNTER — Encounter (HOSPITAL_COMMUNITY)
Admission: AD | Disposition: A | Discharge status: Home / Self Care | Payer: Self-pay | Source: Other Acute Inpatient Hospital | Attending: Cardiovascular Disease

## 2013-04-17 ENCOUNTER — Encounter (HOSPITAL_COMMUNITY): Payer: Self-pay | Admitting: *Deleted

## 2013-04-17 ENCOUNTER — Other Ambulatory Visit: Payer: Self-pay | Admitting: Physician Assistant

## 2013-04-17 ENCOUNTER — Inpatient Hospital Stay (HOSPITAL_COMMUNITY)
Admission: AD | Admit: 2013-04-17 | Discharge: 2013-04-18 | DRG: 282 | Disposition: A | Discharge status: Home / Self Care | Payer: Medicare Other | Source: Other Acute Inpatient Hospital | Attending: Cardiovascular Disease | Admitting: Cardiovascular Disease

## 2013-04-17 DIAGNOSIS — I2510 Atherosclerotic heart disease of native coronary artery without angina pectoris: Secondary | ICD-10-CM

## 2013-04-17 DIAGNOSIS — Z9119 Patient's noncompliance with other medical treatment and regimen: Secondary | ICD-10-CM

## 2013-04-17 DIAGNOSIS — E785 Hyperlipidemia, unspecified: Secondary | ICD-10-CM | POA: Diagnosis present

## 2013-04-17 DIAGNOSIS — Z9861 Coronary angioplasty status: Secondary | ICD-10-CM

## 2013-04-17 DIAGNOSIS — Z87891 Personal history of nicotine dependence: Secondary | ICD-10-CM

## 2013-04-17 DIAGNOSIS — I214 Non-ST elevation (NSTEMI) myocardial infarction: Secondary | ICD-10-CM

## 2013-04-17 DIAGNOSIS — R42 Dizziness and giddiness: Secondary | ICD-10-CM | POA: Diagnosis present

## 2013-04-17 DIAGNOSIS — Z79899 Other long term (current) drug therapy: Secondary | ICD-10-CM

## 2013-04-17 DIAGNOSIS — Z8521 Personal history of malignant neoplasm of larynx: Secondary | ICD-10-CM

## 2013-04-17 DIAGNOSIS — G4733 Obstructive sleep apnea (adult) (pediatric): Secondary | ICD-10-CM | POA: Diagnosis present

## 2013-04-17 DIAGNOSIS — Z91199 Patient's noncompliance with other medical treatment and regimen due to unspecified reason: Secondary | ICD-10-CM

## 2013-04-17 DIAGNOSIS — I77810 Thoracic aortic ectasia: Secondary | ICD-10-CM

## 2013-04-17 DIAGNOSIS — R51 Headache: Secondary | ICD-10-CM | POA: Diagnosis present

## 2013-04-17 DIAGNOSIS — Z85819 Personal history of malignant neoplasm of unspecified site of lip, oral cavity, and pharynx: Secondary | ICD-10-CM

## 2013-04-17 DIAGNOSIS — I208 Other forms of angina pectoris: Secondary | ICD-10-CM

## 2013-04-17 DIAGNOSIS — Z6831 Body mass index (BMI) 31.0-31.9, adult: Secondary | ICD-10-CM

## 2013-04-17 DIAGNOSIS — I10 Essential (primary) hypertension: Secondary | ICD-10-CM | POA: Diagnosis present

## 2013-04-17 DIAGNOSIS — Z7982 Long term (current) use of aspirin: Secondary | ICD-10-CM

## 2013-04-17 DIAGNOSIS — K219 Gastro-esophageal reflux disease without esophagitis: Secondary | ICD-10-CM | POA: Diagnosis present

## 2013-04-17 DIAGNOSIS — R748 Abnormal levels of other serum enzymes: Secondary | ICD-10-CM

## 2013-04-17 DIAGNOSIS — Z923 Personal history of irradiation: Secondary | ICD-10-CM

## 2013-04-17 HISTORY — PX: LEFT HEART CATHETERIZATION WITH CORONARY ANGIOGRAM: SHX5451

## 2013-04-17 LAB — CBC
MCH: 30.1 pg (ref 26.0–34.0)
MCHC: 34.9 g/dL (ref 30.0–36.0)
Platelets: 213 10*3/uL (ref 150–400)
RBC: 5.09 MIL/uL (ref 4.22–5.81)
RDW: 13 % (ref 11.5–15.5)

## 2013-04-17 LAB — BASIC METABOLIC PANEL
Calcium: 9.4 mg/dL (ref 8.4–10.5)
GFR calc Af Amer: >90 mL/min (ref >90)
GFR calc non Af Amer: 89 mL/min — ABNORMAL LOW (ref >90)
Sodium: 136 mEq/L (ref 135–145)

## 2013-04-17 LAB — MRSA PCR SCREENING: MRSA by PCR: NEGATIVE

## 2013-04-17 LAB — PROTIME-INR
INR: 1.02 (ref 0.00–1.49)
Prothrombin Time: 13.2 seconds (ref 11.6–15.2)

## 2013-04-17 SURGERY — LEFT HEART CATHETERIZATION WITH CORONARY ANGIOGRAM
Anesthesia: LOCAL

## 2013-04-17 MED ORDER — SODIUM CHLORIDE 0.9 % IJ SOLN: 3.0000 mL | Freq: Two times a day (BID) | INTRAMUSCULAR | Status: DC

## 2013-04-17 MED ORDER — AZITHROMYCIN 250 MG PO TABS
250.0000 mg | ORAL_TABLET | Freq: Every day | ORAL | Status: DC
  Administered 2013-04-17 – 2013-04-18 (×2): 250 mg via ORAL
  Filled 2013-04-17 (×2): qty 1

## 2013-04-17 MED ORDER — DOXYCYCLINE HYCLATE 100 MG PO TABS
100.0000 mg | ORAL_TABLET | Freq: Two times a day (BID) | ORAL | Status: DC
  Administered 2013-04-17 – 2013-04-18 (×3): 100 mg via ORAL
  Filled 2013-04-17 (×5): qty 1

## 2013-04-17 MED ORDER — ATORVASTATIN CALCIUM 80 MG PO TABS
80.0000 mg | ORAL_TABLET | Freq: Every day | ORAL | Status: DC
  Administered 2013-04-17: 80 mg via ORAL
  Filled 2013-04-17 (×3): qty 1

## 2013-04-17 MED ORDER — MIDAZOLAM HCL 2 MG/2ML IJ SOLN
INTRAMUSCULAR | Status: AC
  Filled 2013-04-17: qty 2

## 2013-04-17 MED ORDER — CEFTRIAXONE SODIUM 1 G IJ SOLR
1.0000 g | INTRAMUSCULAR | Status: DC
  Administered 2013-04-17: 1 g via INTRAVENOUS
  Filled 2013-04-17 (×2): qty 10

## 2013-04-17 MED ORDER — ASPIRIN 81 MG PO CHEW: 324.0000 mg | CHEWABLE_TABLET | ORAL | Status: DC

## 2013-04-17 MED ORDER — SODIUM CHLORIDE 0.9 % IV SOLN: INTRAVENOUS | Status: DC

## 2013-04-17 MED ORDER — PANTOPRAZOLE SODIUM 40 MG PO TBEC
40.0000 mg | DELAYED_RELEASE_TABLET | Freq: Every day | ORAL | Status: DC
  Administered 2013-04-17 – 2013-04-18 (×2): 40 mg via ORAL
  Filled 2013-04-17 (×2): qty 1

## 2013-04-17 MED ORDER — FENTANYL CITRATE 0.05 MG/ML IJ SOLN
INTRAMUSCULAR | Status: AC
  Filled 2013-04-17: qty 2

## 2013-04-17 MED ORDER — ASPIRIN EC 81 MG PO TBEC
81.0000 mg | DELAYED_RELEASE_TABLET | Freq: Every day | ORAL | Status: DC
  Administered 2013-04-18: 09:00:00 81 mg via ORAL
  Filled 2013-04-17: qty 1

## 2013-04-17 MED ORDER — LORAZEPAM 1 MG PO TABS
1.0000 mg | ORAL_TABLET | Freq: Every day | ORAL | Status: DC
  Administered 2013-04-17: 1 mg via ORAL
  Filled 2013-04-17: qty 1

## 2013-04-17 MED ORDER — ACETAMINOPHEN 325 MG PO TABS: 650.0000 mg | ORAL_TABLET | ORAL | Status: DC | PRN

## 2013-04-17 MED ORDER — NITROGLYCERIN 0.2 MG/ML ON CALL CATH LAB
INTRAVENOUS | Status: AC
  Filled 2013-04-17: qty 1

## 2013-04-17 MED ORDER — HEPARIN (PORCINE) IN NACL 100-0.45 UNIT/ML-% IJ SOLN
1240.0000 [IU]/h | INTRAMUSCULAR | Status: DC
  Filled 2013-04-17: qty 250

## 2013-04-17 MED ORDER — SODIUM CHLORIDE 0.9 % IV SOLN
250.0000 mL | INTRAVENOUS | Status: DC | PRN
Start: 2013-04-17 — End: 2013-04-17

## 2013-04-17 MED ORDER — SODIUM CHLORIDE 0.9 % IJ SOLN: 3.0000 mL | INTRAMUSCULAR | Status: DC | PRN

## 2013-04-17 MED ORDER — NITROGLYCERIN 0.4 MG SL SUBL: 0.4000 mg | SUBLINGUAL_TABLET | SUBLINGUAL | Status: DC | PRN

## 2013-04-17 MED ORDER — ONDANSETRON HCL 4 MG/2ML IJ SOLN: 4.0000 mg | Freq: Four times a day (QID) | INTRAMUSCULAR | Status: DC | PRN

## 2013-04-17 MED ORDER — METOPROLOL TARTRATE 12.5 MG HALF TABLET
12.5000 mg | ORAL_TABLET | Freq: Two times a day (BID) | ORAL | Status: DC
  Administered 2013-04-17 – 2013-04-18 (×3): 12.5 mg via ORAL
  Filled 2013-04-17 (×5): qty 1

## 2013-04-17 MED ORDER — HEPARIN (PORCINE) IN NACL 2-0.9 UNIT/ML-% IJ SOLN
INTRAMUSCULAR | Status: AC
  Filled 2013-04-17: qty 1000

## 2013-04-17 MED ORDER — SODIUM CHLORIDE 0.9 % IV SOLN: INTRAVENOUS | Status: AC

## 2013-04-17 MED ORDER — HEPARIN SODIUM (PORCINE) 1000 UNIT/ML IJ SOLN
INTRAMUSCULAR | Status: AC
  Filled 2013-04-17: qty 1

## 2013-04-17 MED ORDER — IRBESARTAN 75 MG PO TABS
75.0000 mg | ORAL_TABLET | Freq: Every day | ORAL | Status: DC
  Administered 2013-04-18: 09:00:00 75 mg via ORAL
  Filled 2013-04-17: qty 1

## 2013-04-17 MED ORDER — LIDOCAINE HCL (PF) 1 % IJ SOLN
INTRAMUSCULAR | Status: AC
  Filled 2013-04-17: qty 30

## 2013-04-17 MED ORDER — VERAPAMIL HCL 2.5 MG/ML IV SOLN
INTRAVENOUS | Status: AC
  Filled 2013-04-17: qty 2

## 2013-04-17 NOTE — Interval H&P Note (Signed)
History and Physical Interval Note:  04/17/2013 3:16 PM  [name]  has presented today for surgery, with the diagnosis of cad  The various methods of treatment have been discussed with the patient and family. After consideration of risks, benefits and other options for treatment, the patient has consented to  Procedure(s): LEFT HEART CATHETERIZATION WITH CORONARY ANGIOGRAM (N/A) as a surgical intervention .  The patient's history has been reviewed, patient examined, no change in status, stable for surgery.  I have reviewed the patient's chart and labs.  Questions were answered to the patient's satisfaction.    Cath Lab Visit (complete for each Cath Lab visit)  Clinical Evaluation Leading to the Procedure:   ACS: yes  Non-ACS:    Anginal Classification: CCS III  Anti-ischemic medical therapy: Minimal Therapy (1 class of medications)  Non-Invasive Test Results: No non-invasive testing performed  Prior CABG: No previous CABG        [name]

## 2013-04-17 NOTE — Progress Notes (Signed)
ANTICOAGULATION CONSULT NOTE - Initial Consult  Pharmacy Consult for heparin Indication: chest pain/ACS  No Known Allergies  Patient Measurements: Wt= 99kg Ht= 5' 7'' IBW= 66.2 Heparin Dosing Weight: 88kg  Vital Signs:    Labs: No results found for this basename: HGB, HCT, PLT, APTT, LABPROT, INR, HEPARINUNFRC, CREATININE, CKTOTAL, CKMB, TROPONINI,  in the last 72 hours  The CrCl is unknown because both a height and weight (above a minimum accepted value) are required for this calculation.   Medical History: Past Medical History  Diagnosis Date  . Coronary artery disease   . Hypertension   . Angina   . Cancer 1998    throat  . GERD (gastroesophageal reflux disease)   . Arthritis   . Shortness of breath   . Sleep apnea     does not use machine as directed  . Hyperlipidemia   . Angina at rest 11/04/2011  . CAD (coronary artery disease), history of PTCA of LCX in 2008 and 50% stenosis of RCA at that time 11/04/2011  . HTN (hypertension) 11/04/2011  . Hyperlipemia 11/04/2011  . Bradycardia, drug induced 11/04/2011  . H/O laryngeal cancer, treated with radiation therapy 11/04/2011  . Aortic root dilatation, history of in 2008 11/04/2011    Medications:  Prescriptions prior to admission  Medication Sig Dispense Refill  . aspirin EC 325 MG tablet Take 325 mg by mouth daily.      . cetirizine (ZYRTEC) 10 MG tablet Take 10 mg by mouth daily.      Marland Kitchen etodolac (LODINE) 400 MG tablet Take 400 mg by mouth 2 (two) times daily as needed. For knee or back pain      . fluticasone (FLONASE) 50 MCG/ACT nasal spray Place 2 sprays into the nose daily.      . isosorbide mononitrate (IMDUR) 30 MG 24 hr tablet Take 1 tablet (30 mg total) by mouth daily.  30 tablet  5  . LORazepam (ATIVAN) 1 MG tablet Take 1 mg by mouth daily as needed. For sleep      . metoprolol tartrate (LOPRESSOR) 12.5 mg TABS Take 0.5 tablets (12.5 mg total) by mouth 2 (two) times daily.  30 tablet  5  . nitroGLYCERIN (NITROSTAT)  0.4 MG SL tablet Place 1 tablet (0.4 mg total) under the tongue every 5 (five) minutes x 3 doses as needed for chest pain (Take one tablet every five minutes if chest pain continues.  DO NOT take more than three(3) tablets.  If you still have chest pain after the third tablet, call 911!).  25 tablet  5  . simvastatin (ZOCOR) 40 MG tablet Take 1 tablet (40 mg total) by mouth daily at 6 PM.  30 tablet  5  . valsartan (DIOVAN) 80 MG tablet Take 80 mg by mouth daily.       Scheduled:  . [START ON 04/18/2013] aspirin  324 mg Oral Pre-Cath  . [START ON 04/18/2013] aspirin EC  81 mg Oral Daily  . atorvastatin  80 mg Oral q1800  . azithromycin  250 mg Oral Daily  . cefTRIAXone (ROCEPHIN)  IV  1 g Intravenous Q24H  . doxycycline  100 mg Oral Q12H  . [START ON 04/18/2013] irbesartan  75 mg Oral Daily  . LORazepam  1 mg Oral QHS  . metoprolol tartrate  12.5 mg Oral BID  . pantoprazole  40 mg Oral Q1200  . sodium chloride  3 mL Intravenous Q12H    Assessment: 72 yo male from [place] noted  with elevated troponins. Patient is on heparin at 1240 units/hr (started this am with 5000 unit IV heparin bolus) started today at about 10am.  Noted plans for cath today    Goal of Therapy:  Heparin level 0.3-0.7 units/ml Monitor platelets by anticoagulation protocol: Yes   Plan:  -Continue heparin at current rate of 1240 units/hr (~ 14 units/kg/hr) -Will follow post cath   Harland German, Pharm D 04/17/2013 1:39 PM

## 2013-04-17 NOTE — CV Procedure (Addendum)
    Cardiac Catheterization Operative Report  [name] [id number] 7/18/20144:02 PM No primary provider on file.  Procedure Performed:  1. Left Heart Catheterization 2. Selective Coronary Angiography 3. Left ventricular angiogram   Operator: Verne Carrow, MD  Arterial access site:  Right radial artery.   Indication:  72 yo male with history of  CAD transferred from [facility] where he was admitted with headache, elevated BP. Mild elevation troponin. No chest pain. Last cath per [facility] Dr. Herbie Baltimore February 2013.                                 Procedure Details: The risks, benefits, complications, treatment options, and expected outcomes were discussed with the patient. The patient and/or family concurred with the proposed plan, giving informed consent. The patient was brought to the cath lab after IV hydration was begun and oral premedication was given. The patient was further sedated with Versed. The right wrist was assessed with an Allens test which was positive. The right wrist was prepped and draped in a sterile fashion. 1% lidocaine was used for local anesthesia. Using the modified Seldinger access technique, a 5 French sheath was placed in the right radial artery. 3 mg Verapamil was given through the sheath. 5000 units IV heparin was given. Standard diagnostic catheters were used to perform selective coronary angiography. A pigtail catheter was used to perform a left ventricular angiogram. The sheath was removed from the right radial artery and a Terumo hemostasis band was applied at the arteriotomy site on the right wrist.    There were no immediate complications. The patient was taken to the recovery area in stable condition.   Hemodynamic Findings: Central aortic pressure: 111/52 Left ventricular pressure: 112/0/15  Angiographic Findings:  Left main: Short, 30% mid stenosis.   Left Anterior Descending Artery:  Moderate caliber vessel that courses to the  apex. The proximal vessel has a long smooth 50% stenosis which is unchanged from last cath in 2013. The mid vessel has a 40% focal stenosis. The distal vessel becomes very small caliber and reaches the apex. The first diagonal is a very small caliber vessel with no stenosis. The second diagonal is a moderate caliber vessel with no obstructive disease.   Circumflex Artery: Large caliber vessel with large left sided posterolateral system with several OM branches with mild disease in the OM branches.  No obstructive disease.   Right Coronary Artery: Large dominant vessel with 50% smooth tubular stenosis, unchanged from last cath. No obstructive disease.   Left Ventricular Angiogram: LVEF=45% with hypokinesis of the apex, inferoapical wall.   Impression: 1. Moderate non-obstructive CAD 2. Mild LV systolic dysfunction 3. Elevated troponin in absence of chest pain.   Recommendations: Continue medical management. Monitor overnight. Repeat CXR, CBC in am. If no evidence of pneumonia, can likely d/c antibiotics. This was a presumptive diagnosis as the notes indicate no definitive infiltrate on CXR at [facility] (no CXR films available for review). Discharge in am if stable.        Complications:  None. The patient tolerated the procedure well.

## 2013-04-17 NOTE — Progress Notes (Signed)
TR BAND REMOVAL  LOCATION:    right radial  DEFLATED PER PROTOCOL:    yes  TIME BAND OFF / DRESSING APPLIED:    2230   SITE UPON ARRIVAL:    Level 0  SITE AFTER BAND REMOVAL:    Level 0  REVERSE ALLEN'S TEST:     positive  CIRCULATION SENSATION AND MOVEMENT:    Within Normal Limits   yes  COMMENTS:    

## 2013-04-17 NOTE — H&P (Signed)
[facility]                                       [place], [place]  [address]       NAME:  [name], [name]             ROOM:          604-54 UNIT NUMBER:  [id number]                        LOCATION:      34F ADM/VISIT DATE:     04/17/13                ADM PHYS:      Ignatius Specking MD ACCT: [id number]                               DOB:           08-May-2041    CONSULTATION REPORT  DATE OF CONSULTATION:  04/17/2013    PRIMARY CARDIOLOGIST:  Nona Dell, M.D. (new)    REFERRING PHYSICIAN:  Doreen Beam, M.D.    REASON FOR CONSULTATION:  Abnormal troponins.    HISTORY OF PRESENT ILLNESS:  [name] is a 72 year old male, previously followed by Dr. Susa Griffins of [facility], with known coronary artery disease status post PCI in 2007 at [facility]. He reportedly had a second cardiac catheterization early last year, again at [facility], but for which he did not require percutaneous intervention.  He also has history of normal left ventricular function, by echocardiography in Dr. Sherril Croon' office, in January 2012.    The patient presented to the emergency room yesterday with complaint of a significant headache with associated dizziness.  He did not complain of chest pain.  He presented with a blood pressure of 148/74, pulse 77, and a temperature of 100.3.  Given his cardiac history, he did receive 4 baby aspirin at around midnight last night.  He was also placed on IV antibiotics.    Serial cardiac markers were drawn and notable for normal CPK/MBs, but with initial troponin I of 1.72 yesterday and a followup level of 1.26 this morning. A third set is currently pending.    Admission EKG was abnormal with evidence of slight J point elevation in leads I, aVL, and V2, with otherwise nonspecific ST abnormalities in the lateral leads.  Isolated PVC also noted.  However, followup EKG this morning is suggestive of dynamic  changes with symmetric T wave inversion in the high lateral and anterolateral leads.    Of note, the patient has remained hemodynamically stable since admission, and has not experienced any chest pain.    ALLERGIES:  No known drug allergies.    HOME MEDICATIONS: 1.   Aspirin 325 mg daily. 2.   Lipitor 80 mg daily. 3.   Diovan 40 mg b.i.d. 4.   Omega 3 fish oil 1 capsule b.i.d. 5.   Lodine XL 400 mg t.i.d. 6.   Protonix 40 mg daily. 7.   Ativan 1 mg q.h.s. p.r.n. 8.   Nitrostat 0.4 mg p.r.n.  PAST MEDICAL HISTORY: 1.   CAD. A.  Status post PCI, 2007 [facility]). 2.   Normal left ventricular function. 3.   HTN. 4.   HLD. 5.   GERD. 6.   Remote tobacco. 7.   Throat cancer. A.  Status post radiation therapy, 1998. 8.   Obstructive sleep apnea. A.  CPAP noncompliant. 9.   Morbid obesity.    SOCIAL HISTORY:  The patient is married.  He quit smoking in 1998.  Denies alcohol use.  He is followed at the [facility] in [place], [place].    FAMILY HISTORY:  Noncontributory for premature coronary artery disease.    REVIEW OF SYSTEMS:  The patient denies any recent development of exertional angina, but does have chronic exertional dyspnea.  Denies any tachy palpitations.  Denies any evidence of overt bleeding.  Has occasional reflux symptoms.  Denies a history of diabetes.  Remaining systems reviewed and are negative.    PHYSICAL EXAMINATION:  Vital signs:  Blood pressure currently 109/49, pulse 50 irregular, temperature currently 97.9 with a T-max of 100.3 on admission, sats 98% on 2 liters, weight 218 pounds.  General:  A 72 year old male, obese, lying supine, no distress.  HEENT:  Normocephalic, atraumatic.  PERRLA, EOMI.  Neck: Palpable bilateral carotid pulses without bruits.  No JVD at 30 degrees. Lungs:  Clear to auscultation bilaterally.  Heart:  Regular rate and rhythm. No significant murmurs.  No rubs or gallops.  Abdomen:  Soft, protuberant, intact bowel sounds.   Extremities:  Palpable bilateral femoral pulses without bruits; palpable dorsalis pedis pulses; no significant peripheral edema.  Skin: Warm and dry.  Musculoskeletal:  No evidence of deformity.  Neurological: Alert and oriented.    IMAGING:  CT scan of the head:  No acute changes.  Admission chest x-ray: Borderline cardiomegaly, mild bibasilar opacities consistent with atelectasis, and question mild pneumonia.  Admission EKG:  NSR 77 bpm, LAD/LAHB, isolated PVC, and slight J point elevation in leads V2, I, and aVL.  LABORATORY DATA:  Troponin I 1.72, 1.26, with third set pending.  Normal CK/MB (2).  LFTs normal.  Urinalysis negative.  Blood cultures pending.  Sodium 133, potassium 3.9, BUN 9, creatinine 0.8, glucose 107.  CBC:  Normal.    IMPRESSION: 1.   Non-ST elevation myocardial infarction. A.  Dynamic EKG changes/abnormal troponins. 2.   Coronary artery disease. A.  Status post percutaneous coronary intervention, 2007. 3.   Pneumonia. 4.   Hypertension. 5.   Hyperlipidemia. 6.   Remote tobacco.    PLAN:  Recommendation is to transfer patient directly to [facility], per patient's preference, for continued close monitoring and management of acute coronary syndrome.  Patient will be started on fractionated heparin therapy prior to transfer.  He has been kept NPO except medications, in the event that he will require a cardiac catheterization later today.  However, if he remains hemodynamically stable and pain free, consideration may be given to proceeding with the catheterization on Monday.  In the meanwhile, the patient will be maintained on a regimen of aspirin, beta blocker, and high-dose statin therapy.  Lipid profile will be assessed in the a.m.    The risks/benefits of the procedure were discussed with both the patient and his daughter and they are both in agreement with proceeding.    The patient was seen and examined in conjunction with Dr. Simona Huh.   An extensive review was done of patient's prior hospitalization records.  __________________________________                                              Nona Dell, MD, FACC                                                 __________________________________                                              Prescott Parma, PA-C   Attending note:  Patient seen and examined. Reviewed limited records available and discussed the case with [name], full note outlined above. [name] is a 72 year old male previously followed by Dr. Alanda Amass with a history of CAD status post percutaneous intervention in 2007, details not clear at this time. He presents with somewhat vague symptoms, mainly complaining of headache, also "not feeling right," on a baseline of increasing shortness of breath over the last few weeks. No exertional chest pain. Head CT was negative for acute event. He was noted to have a low-grade increase in temperature to 100.2, chest x-ray without definitive infiltrate, possible basilar atelectasis less likely pneumonia. He has been treated empirically with Levaquin per Dr. Sherril Croon. Now afebrile, and no leukocytosis.  Cardiac markers were sent and consistent with NSTEMI, ECG also shows dynamic ST-T wave changes, mainly in the inferolateral leads over prior baseline.  Case discussed with patient and family member present. Recommendation is to proceed with transfer to [facility] in anticipation of a diagnostic cardiac catheterization to clarify coronary anatomy and determine if any revascularization options are necessary. Will continue Levaquin for now, although no definitive evidence of infection at this time. Would continue to follow temperature curve and CBC.  Jonelle Sidle, M.D., F.A.C.C.

## 2013-04-18 ENCOUNTER — Inpatient Hospital Stay (HOSPITAL_COMMUNITY): Payer: Medicare Other

## 2013-04-18 DIAGNOSIS — I214 Non-ST elevation (NSTEMI) myocardial infarction: Secondary | ICD-10-CM

## 2013-04-18 DIAGNOSIS — I2510 Atherosclerotic heart disease of native coronary artery without angina pectoris: Secondary | ICD-10-CM

## 2013-04-18 LAB — CBC
Hemoglobin: 14.3 g/dL (ref 13.0–17.0)
MCHC: 35.2 g/dL (ref 30.0–36.0)
RDW: 13.3 % (ref 11.5–15.5)
WBC: 6.6 10*3/uL (ref 4.0–10.5)

## 2013-04-18 LAB — BASIC METABOLIC PANEL
BUN: 11 mg/dL (ref 6–23)
Chloride: 101 mEq/L (ref 96–112)
Creatinine, Ser: 0.99 mg/dL (ref 0.50–1.35)
GFR calc Af Amer: >90 mL/min (ref >90)
GFR calc non Af Amer: 79 mL/min — ABNORMAL LOW (ref >90)
Potassium: 3.8 mEq/L (ref 3.5–5.1)

## 2013-04-18 LAB — LIPID PANEL
Cholesterol: 172 mg/dL (ref 0–200)
HDL: 41 mg/dL (ref >39)
Triglycerides: 108 mg/dL (ref <150)

## 2013-04-18 MED ORDER — METOPROLOL TARTRATE 12.5 MG HALF TABLET: 12.5000 mg | ORAL_TABLET | Freq: Two times a day (BID) | ORAL | Status: DC

## 2013-04-18 MED ORDER — ASPIRIN EC 81 MG PO TBEC: 81.0000 mg | DELAYED_RELEASE_TABLET | Freq: Every day | ORAL | Status: AC

## 2013-04-18 NOTE — Progress Notes (Signed)
Patient ID: [name], male   DOB: 01-May-1941, 72 y.o.   MRN: [id number]    SUBJECTIVE: [name] denies chest pain and shortness of breath, as well as palpitations.   Filed Vitals:   04/17/13 2334 04/18/13 0022 04/18/13 0547 04/18/13 0700  BP: 135/58  112/54 119/56  Pulse: 69  61 66  Temp: 98.3 F (36.8 C)  97.5 F (36.4 C) 97.7 F (36.5 C)  TempSrc: Axillary  Oral Oral  Resp: 18  20 19   Height:      Weight:  211 lb 10.3 oz (96 kg)    SpO2: 97%  92% 93%    Intake/Output Summary (Last 24 hours) at 04/18/13 1142 Last data filed at 04/17/13 2335  Gross per 24 hour  Intake  502.4 ml  Output    700 ml  Net -197.6 ml    PHYSICAL EXAM General: NAD Neck: No JVD, no thyromegaly or thyroid nodule.  Lungs: Clear to auscultation bilaterally with normal respiratory effort. CV: Nondisplaced PMI.  Heart regular S1/S2, no S3/S4, no murmur.  No peripheral edema.  No carotid bruit.  Normal pedal pulses.  Abdomen: Soft, nontender, no hepatosplenomegaly, no distention.  Neurologic: Alert and oriented x 3.  Psych: Normal affect. Extremities: No clubbing or cyanosis.   Tele: NSR   LABS: Basic Metabolic Panel:  Recent Labs  40/98/11 1413 04/18/13 0500  NA 136 136  K 3.9 3.8  CL 103 101  CO2 20 25  GLUCOSE 102* 94  BUN 9 11  CREATININE 0.75 0.99  CALCIUM 9.4 9.3   Liver Function Tests: No results found for this basename: AST, ALT, ALKPHOS, BILITOT, PROT, ALBUMIN,  in the last 72 hours No results found for this basename: LIPASE, AMYLASE,  in the last 72 hours CBC:  Recent Labs  04/17/13 1413 04/18/13 0500  WBC 6.2 6.6  HGB 15.3 14.3  HCT 43.8 40.6  MCV 86.1 87.5  PLT 213 227   Cardiac Enzymes: No results found for this basename: CKTOTAL, CKMB, CKMBINDEX, TROPONINI,  in the last 72 hours BNP: No components found with this basename: POCBNP,  D-Dimer: No results found for this basename: DDIMER,  in the last 72 hours Hemoglobin A1C: No results found for this  basename: HGBA1C,  in the last 72 hours Fasting Lipid Panel:  Recent Labs  04/18/13 0500  CHOL 172  HDL 41  LDLCALC 109*  TRIG 108  CHOLHDL 4.2   Thyroid Function Tests: No results found for this basename: TSH, T4TOTAL, FREET3, T3FREE, THYROIDAB,  in the last 72 hours Anemia Panel: No results found for this basename: VITAMINB12, FOLATE, FERRITIN, TIBC, IRON, RETICCTPCT,  in the last 72 hours  RADIOLOGY: Dg Chest 2 View  04/18/2013   *RADIOLOGY REPORT*  Clinical Data: Possible pneumonia  CHEST - 2 VIEW  Comparison: 04/16/2013  Findings: The heart size and vascular pattern are normal.  The aorta is uncoiled.  There is no consolidation or effusion.  IMPRESSION: No acute findings   Original Report Authenticated By: Esperanza Heir, M.D.   Angiographic Findings:  Left main: Short, 30% mid stenosis.  Left Anterior Descending Artery: Moderate caliber vessel that courses to the apex. The proximal vessel has a long smooth 50% stenosis which is unchanged from last cath in 2013. The mid vessel has a 40% focal stenosis. The distal vessel becomes very small caliber and reaches the apex. The first diagonal is a very small caliber vessel with no stenosis. The second diagonal is a moderate caliber  vessel with no obstructive disease.  Circumflex Artery: Large caliber vessel with large left sided posterolateral system with several OM branches with mild disease in the OM branches. No obstructive disease.  Right Coronary Artery: Large dominant vessel with 50% smooth tubular stenosis, unchanged from last cath. No obstructive disease.  Left Ventricular Angiogram: LVEF=45% with hypokinesis of the apex, inferoapical wall.     ASSESSMENT AND PLAN: 1. CAD: he is asymptomatic with moderate LAD and RCA disease, and mildly reduced LV systolic function. We will resume his outpatient meds upon discharge today. However, would reduce ASA to 81 mg daily and continue his Lipitor and Diovan at current doses. Would  prescribe low-dose Metoprolol 12.5 mg bid, along with SL nitro.  2. Dispo: discharge today. No indication for antibiotics (normal chest x-ray and no leukocytosis).   Prentice Docker, M.D., F.A.C.C.

## 2013-04-18 NOTE — Discharge Summary (Signed)
CARDIOLOGY DISCHARGE SUMMARY    Patient ID: [name],  MRN: [id number], DOB/AGE: [birth year] 74 y.o.  Admit date: 04/17/2013 Discharge date: 04/18/2013  Primary Care Physician: Doreen Beam, MD Primary Cardiologist: New to Janace Hoard, MD  Primary Discharge Diagnosis:  1. NSTEMI  2. Known CAD  Secondary Discharge Diagnoses:  1. HTN 2. Dyslipidemia 3. OSA 4. Obesity 5. GERD 6. History of throat CA  Procedures This Admission:  1. Left heart catheterization Hemodynamic Findings:  Central aortic pressure: 111/52  Left ventricular pressure: 112/0/15  Angiographic Findings:  Left main: Short, 30% mid stenosis.  Left Anterior Descending Artery: Moderate caliber vessel that courses to the apex. The proximal vessel has a long smooth 50% stenosis which is unchanged from last cath in 2013. The mid vessel has a 40% focal stenosis. The distal vessel becomes very small caliber and reaches the apex. The first diagonal is a very small caliber vessel with no stenosis. The second diagonal is a moderate caliber vessel with no obstructive disease.  Circumflex Artery: Large caliber vessel with large left sided posterolateral system with several OM branches with mild disease in the OM branches. No obstructive disease.  Right Coronary Artery: Large dominant vessel with 50% smooth tubular stenosis, unchanged from last cath. No obstructive disease.  Left Ventricular Angiogram: LVEF=45% with hypokinesis of the apex, inferoapical wall.  Impression:  1. Moderate non-obstructive CAD  2. Mild LV systolic dysfunction  3. Elevated troponin in absence of chest pain.  Recommendations: Continue medical management. Monitor overnight. Repeat CXR, CBC in am. If no evidence of pneumonia, can likely d/c antibiotics. This was a presumptive diagnosis as the notes indicate no definitive infiltrate on CXR at [facility] (no CXR films available for review). Discharge in am if stable.   History and Hospital  Course:  [name] is a 72 year old male, previously followed by Dr. Susa Griffins of [facility], with known coronary artery disease status post PCI in 2007 at [facility]. He reportedly had a second cardiac catheterization early last year, again at [facility], but for which he did not require percutaneous intervention. He also has history of normal left ventricular function, by echocardiography in Dr. Sherril Croon' office, in January 2012. He presented to the emergency room 04/17/2013 with complaint of headache with associated dizziness. He did not complain of chest pain. He presented with a blood pressure of 148/74, pulse 77, and temperature of 100.3. He was started on IV antibiotics. Serial cardiac markers were drawn and notable for normal CPK/MBs but with initial troponin I of 1.72 yesterday and a followup level of 1.26. He was then transferred to [facility] for cardiac catheterization. On 04/17/2013 he underwent cardiac catheterization with details as outlined above. He was continued on medical therapy. His chest x-ray and CBC were repeated this AM. His CXR was normal and there was no leukocytosis. Antibiotics were discontinued. His renal function is stable. He remains hemodynamically stable. He has been seen, examined and deemed stable for discharge today by Dr. Prentice Docker. He will follow-up in clinic in 3 weeks.  Discharge Vitals: Blood pressure 119/56, pulse 66, temperature 97.7 F (36.5 C), temperature source Oral, resp. rate 19, height 5\' 9"  (1.753 m), weight 211 lb 10.3 oz (96 kg), SpO2 93.00%.   Labs: Lab Results  Component Value Date   WBC 6.6 04/18/2013   HGB 14.3 04/18/2013   HCT 40.6 04/18/2013   MCV 87.5 04/18/2013   PLT 227 04/18/2013    Recent Labs Lab  04/18/13 0500  NA 136  K 3.8  CL 101  CO2 25  BUN 11  CREATININE 0.99  CALCIUM 9.3  GLUCOSE 94    Lab Results  Component Value Date   CHOL 172 04/18/2013   CHOL 106 11/05/2011   CHOL  Value: 132         ATP III CLASSIFICATION:  <200     mg/dL   Desirable  161-096  mg/dL   Borderline High  >=045    mg/dL   High 01/07/8118   Lab Results  Component Value Date   HDL 41 04/18/2013   HDL 41 10/05/7827   HDL 36* 5/62/1308   Lab Results  Component Value Date   LDLCALC 109* 04/18/2013   LDLCALC 44 11/05/2011   LDLCALC  Value: 77        Total Cholesterol/HDL:CHD Risk Coronary Heart Disease Risk Table                     Men   Women  1/2 Average Risk   3.4   3.3 03/14/2007   Lab Results  Component Value Date   TRIG 108 04/18/2013   TRIG 103 11/05/2011   TRIG 94 03/14/2007   Lab Results  Component Value Date   CHOLHDL 4.2 04/18/2013   CHOLHDL 2.6 11/05/2011   CHOLHDL 3.7 03/14/2007    Recent Labs  04/17/13 1413  INR 1.02    Disposition:  The patient is being discharged in stable condition.  Follow-up: Follow-up Information   Follow up with SERPE, EUGENE, PA-C On 05/13/2013. (At 2:20 PM)    Contact information:    [facility] [address] [place] [address] [place] [address] [phone]     Discharge Medications:    Medication List         aspirin EC 81 MG tablet  Take 1 tablet (81 mg total) by mouth daily.     atorvastatin 80 MG tablet  Commonly known as:  LIPITOR  Take 80 mg by mouth at bedtime.     cetirizine 10 MG tablet  Commonly known as:  ZYRTEC  Take 10 mg by mouth daily.     etodolac 400 MG tablet  Commonly known as:  LODINE  Take 400 mg by mouth 3 (three) times daily as needed (for knee pain).     fluticasone 50 MCG/ACT nasal spray  Commonly known as:  FLONASE  Place 2 sprays into the nose daily as needed for rhinitis.     LORazepam 1 MG tablet  Commonly known as:  ATIVAN  Take 1 mg by mouth daily as needed. For sleep     metoprolol tartrate 12.5 mg Tabs  Commonly known as:  LOPRESSOR  Take 0.5 tablets (12.5 mg total) by mouth 2 (two) times daily.     nitroGLYCERIN 0.4 MG SL tablet  Commonly known as:  NITROSTAT  Place 0.4 mg under the tongue every 5  (five) minutes as needed for chest pain.     pantoprazole 40 MG tablet  Commonly known as:  PROTONIX  Take 40 mg by mouth 2 (two) times daily. Gi     TH OMEGA-3 FISH OIL PO  Take 1 capsule by mouth daily.     valsartan 40 MG tablet  Commonly known as:  DIOVAN  Take 20-40 mg by mouth daily. 20mg  in the am and 40mg  in the pm       Duration of Discharge Encounter: Greater than 30 minutes including physician  time.  Limmie Patricia, PA-C 04/18/2013, 12:53 PM

## 2013-04-20 MED FILL — Heparin Sodium (Porcine) 100 Unt/ML in Sodium Chloride 0.45%: INTRAMUSCULAR | Qty: 250 | Status: AC

## 2013-05-13 ENCOUNTER — Encounter: Payer: Medicare Other | Admitting: Physician Assistant

## 2013-05-13 ENCOUNTER — Encounter: Payer: Self-pay | Admitting: Physician Assistant

## 2013-09-10 ENCOUNTER — Encounter (HOSPITAL_COMMUNITY): Payer: Self-pay | Admitting: Emergency Medicine

## 2013-09-10 ENCOUNTER — Emergency Department (HOSPITAL_COMMUNITY): Payer: Medicare Other

## 2013-09-10 ENCOUNTER — Inpatient Hospital Stay (HOSPITAL_COMMUNITY)
Admission: EM | Admit: 2013-09-10 | Discharge: 2013-09-12 | DRG: 247 | Disposition: A | Discharge status: Home / Self Care | Payer: Medicare Other | Attending: Cardiovascular Disease | Admitting: Cardiovascular Disease

## 2013-09-10 DIAGNOSIS — I77810 Thoracic aortic ectasia: Secondary | ICD-10-CM

## 2013-09-10 DIAGNOSIS — I2510 Atherosclerotic heart disease of native coronary artery without angina pectoris: Secondary | ICD-10-CM | POA: Diagnosis present

## 2013-09-10 DIAGNOSIS — Z7982 Long term (current) use of aspirin: Secondary | ICD-10-CM | POA: Diagnosis not present

## 2013-09-10 DIAGNOSIS — Z8521 Personal history of malignant neoplasm of larynx: Secondary | ICD-10-CM

## 2013-09-10 DIAGNOSIS — Z955 Presence of coronary angioplasty implant and graft: Secondary | ICD-10-CM

## 2013-09-10 DIAGNOSIS — Z87891 Personal history of nicotine dependence: Secondary | ICD-10-CM

## 2013-09-10 DIAGNOSIS — K219 Gastro-esophageal reflux disease without esophagitis: Secondary | ICD-10-CM | POA: Diagnosis present

## 2013-09-10 DIAGNOSIS — E785 Hyperlipidemia, unspecified: Secondary | ICD-10-CM | POA: Diagnosis present

## 2013-09-10 DIAGNOSIS — I208 Other forms of angina pectoris: Secondary | ICD-10-CM

## 2013-09-10 DIAGNOSIS — I200 Unstable angina: Secondary | ICD-10-CM | POA: Diagnosis present

## 2013-09-10 DIAGNOSIS — I10 Essential (primary) hypertension: Secondary | ICD-10-CM | POA: Diagnosis present

## 2013-09-10 HISTORY — PX: CORONARY ANGIOPLASTY WITH STENT PLACEMENT: SHX49

## 2013-09-10 HISTORY — DX: Atherosclerotic heart disease of native coronary artery without angina pectoris: I25.10

## 2013-09-10 LAB — BASIC METABOLIC PANEL
BUN: 15 mg/dL (ref 6–23)
Calcium: 9.5 mg/dL (ref 8.4–10.5)
Creatinine, Ser: 0.97 mg/dL (ref 0.50–1.35)
GFR calc Af Amer: >90 mL/min (ref >90)
GFR calc non Af Amer: 79 mL/min — ABNORMAL LOW (ref >90)
Potassium: 4 mEq/L (ref 3.5–5.1)

## 2013-09-10 LAB — CBC
HCT: 44.8 % (ref 39.0–52.0)
MCH: 30.5 pg (ref 26.0–34.0)
MCHC: 34.8 g/dL (ref 30.0–36.0)
RDW: 13.4 % (ref 11.5–15.5)

## 2013-09-10 LAB — POCT I-STAT TROPONIN I

## 2013-09-10 MED ORDER — SODIUM CHLORIDE 0.9 % IJ SOLN
3.0000 mL | Freq: Two times a day (BID) | INTRAMUSCULAR | Status: DC
  Administered 2013-09-11 (×2): 3 mL via INTRAVENOUS

## 2013-09-10 MED ORDER — SODIUM CHLORIDE 0.9 % IJ SOLN
3.0000 mL | Freq: Two times a day (BID) | INTRAMUSCULAR | Status: DC
  Administered 2013-09-11: 3 mL via INTRAVENOUS

## 2013-09-10 MED ORDER — NITROGLYCERIN 0.4 MG SL SUBL: 0.4000 mg | SUBLINGUAL_TABLET | SUBLINGUAL | Status: DC | PRN

## 2013-09-10 MED ORDER — SODIUM CHLORIDE 0.9 % IJ SOLN: 3.0000 mL | INTRAMUSCULAR | Status: DC | PRN

## 2013-09-10 MED ORDER — LORATADINE 10 MG PO TABS
10.0000 mg | ORAL_TABLET | Freq: Every day | ORAL | Status: DC
  Administered 2013-09-11: 10 mg via ORAL
  Filled 2013-09-10 (×2): qty 1

## 2013-09-10 MED ORDER — SODIUM CHLORIDE 0.9 % IV SOLN: 250.0000 mL | INTRAVENOUS | Status: DC | PRN

## 2013-09-10 MED ORDER — ATORVASTATIN CALCIUM 80 MG PO TABS
80.0000 mg | ORAL_TABLET | ORAL | Status: DC
  Filled 2013-09-10: qty 1

## 2013-09-10 MED ORDER — PRESCRIPTION MEDICATION: 1.0000 [drp] | Freq: Three times a day (TID) | Status: DC

## 2013-09-10 MED ORDER — ATORVASTATIN CALCIUM 80 MG PO TABS
80.0000 mg | ORAL_TABLET | Freq: Once | ORAL | Status: AC
  Administered 2013-09-11: 80 mg via ORAL
  Filled 2013-09-10 (×2): qty 1

## 2013-09-10 MED ORDER — ONDANSETRON HCL 4 MG/2ML IJ SOLN: 4.0000 mg | Freq: Four times a day (QID) | INTRAMUSCULAR | Status: DC | PRN

## 2013-09-10 MED ORDER — HEPARIN (PORCINE) IN NACL 100-0.45 UNIT/ML-% IJ SOLN
1250.0000 [IU]/h | INTRAMUSCULAR | Status: DC
  Administered 2013-09-11: 1000 [IU]/h via INTRAVENOUS
  Filled 2013-09-10 (×2): qty 250

## 2013-09-10 MED ORDER — HEPARIN BOLUS VIA INFUSION
4000.0000 [IU] | Freq: Once | INTRAVENOUS | Status: AC
  Administered 2013-09-11: 4000 [IU] via INTRAVENOUS
  Filled 2013-09-10: qty 4000

## 2013-09-10 MED ORDER — ASPIRIN 81 MG PO CHEW
324.0000 mg | CHEWABLE_TABLET | ORAL | Status: AC
  Administered 2013-09-11: 324 mg via ORAL
  Filled 2013-09-10: qty 4

## 2013-09-10 MED ORDER — IRBESARTAN 150 MG PO TABS
150.0000 mg | ORAL_TABLET | Freq: Every day | ORAL | Status: DC
  Administered 2013-09-11 – 2013-09-12 (×3): 150 mg via ORAL
  Filled 2013-09-10 (×3): qty 1

## 2013-09-10 MED ORDER — ACETAMINOPHEN 325 MG PO TABS: 650.0000 mg | ORAL_TABLET | ORAL | Status: DC | PRN

## 2013-09-10 MED ORDER — ZOLPIDEM TARTRATE 5 MG PO TABS: 5.0000 mg | ORAL_TABLET | Freq: Every evening | ORAL | Status: DC | PRN

## 2013-09-10 MED ORDER — SODIUM CHLORIDE 0.9 % IV SOLN: 1.0000 mL/kg/h | INTRAVENOUS | Status: DC

## 2013-09-10 MED ORDER — ALPRAZOLAM 0.25 MG PO TABS: 0.2500 mg | ORAL_TABLET | Freq: Two times a day (BID) | ORAL | Status: DC | PRN

## 2013-09-10 MED ORDER — PANTOPRAZOLE SODIUM 40 MG PO TBEC
40.0000 mg | DELAYED_RELEASE_TABLET | Freq: Two times a day (BID) | ORAL | Status: DC
  Administered 2013-09-10 – 2013-09-12 (×4): 40 mg via ORAL
  Filled 2013-09-10 (×4): qty 1

## 2013-09-10 MED ORDER — DIAZEPAM 5 MG PO TABS
5.0000 mg | ORAL_TABLET | ORAL | Status: AC
  Administered 2013-09-11: 5 mg via ORAL
  Filled 2013-09-10: qty 1

## 2013-09-10 MED ORDER — ASPIRIN 81 MG PO CHEW
324.0000 mg | CHEWABLE_TABLET | Freq: Once | ORAL | Status: AC
  Administered 2013-09-10: 324 mg via ORAL
  Filled 2013-09-10: qty 4

## 2013-09-10 MED ORDER — NITROGLYCERIN 2 % TD OINT
0.5000 [in_us] | TOPICAL_OINTMENT | Freq: Four times a day (QID) | TRANSDERMAL | Status: DC
  Administered 2013-09-11 (×2): 0.5 [in_us] via TOPICAL
  Filled 2013-09-10: qty 1

## 2013-09-10 MED ORDER — FLUTICASONE PROPIONATE 50 MCG/ACT NA SUSP
2.0000 | Freq: Every day | NASAL | Status: DC | PRN
  Filled 2013-09-10: qty 16

## 2013-09-10 MED ORDER — ASPIRIN EC 81 MG PO TBEC
81.0000 mg | DELAYED_RELEASE_TABLET | Freq: Every day | ORAL | Status: DC
  Administered 2013-09-12: 81 mg via ORAL
  Filled 2013-09-10 (×2): qty 1

## 2013-09-10 MED ORDER — LORAZEPAM 0.5 MG PO TABS
1.0000 mg | ORAL_TABLET | Freq: Every evening | ORAL | Status: DC | PRN
  Administered 2013-09-11: 1 mg via ORAL
  Filled 2013-09-10: qty 1

## 2013-09-10 NOTE — ED Notes (Signed)
Pt c/o mid sternal CP with SOB which is worse with exertion x 4 days; pt sts hx of stents in past; pt radiates into neck

## 2013-09-10 NOTE — ED Notes (Signed)
Cardiology to bedside. 

## 2013-09-10 NOTE — H&P (Signed)
CARDIOLOGY CONSULT NOTE   Patient ID: [name] MRN: [id number] DOB/AGE: [birth year] 72 y.o.  Admit date: 09/10/2013  Primary Physician   Ignatius Specking., MD Primary Cardiologist   Diona Browner  Reason for Consultation   SOB/chest pain  [name] is a 72 y.o. male with a history of CAD, Non-obstructive by cath in July 2014.   He has been feeling poorly x 3 weeks. Tired and weak. He describes DOE and chest pain with exertion x 3 days. Today, he walked 100 feet and felt SOB, had chest pain and did not feel he could make it back up his steps. He also had some dizziness as well. That has resolved.   He came to the ER, and in the ER, is resting comfortably.    Past Medical History  Diagnosis Date  . Coronary artery disease   . Hypertension   . Angina   . Cancer 1998    throat  . GERD (gastroesophageal reflux disease)   . Arthritis   . Shortness of breath   . Sleep apnea     does not use machine as directed  . Hyperlipidemia   . CAD (coronary artery disease), native coronary artery      PTCA distal CFX 2008 Dr. Jacinto Halim; non-obstructive by cath July 2014  . HTN (hypertension)    . Hyperlipemia    . Bradycardia, drug induced 11/04/2011  . H/O laryngeal cancer, treated with radiation therapy 11/04/2011  . Aortic root dilatation, history of in 2008       Past Surgical History  Procedure Laterality Date  . Cardiac catheterization    . Patella fracture surgery    . Colonoscopy w/ biopsies and polypectomy      No Known Allergies  I have reviewed the patient's current medications Prior to Admission medications   Medication Sig Start Date End Date Taking? Authorizing Provider  aspirin EC 81 MG tablet Take 1 tablet (81 mg total) by mouth daily. 04/18/13  Yes Brooke O Edmisten, PA-C  atorvastatin (LIPITOR) 80 MG tablet Take 80 mg by mouth every other day.    Yes Historical Provider, MD  etodolac (LODINE) 400 MG tablet Take 400 mg by mouth 3 (three) times daily as  needed (for knee pain).   Yes Historical Provider, MD  fexofenadine (ALLEGRA) 180 MG tablet Take 180 mg by mouth daily.   Yes Historical Provider, MD  fluticasone (FLONASE) 50 MCG/ACT nasal spray Place 2 sprays into the nose daily as needed for rhinitis.   Yes Historical Provider, MD  LORazepam (ATIVAN) 1 MG tablet Take 1 mg by mouth at bedtime as needed for sleep. For sleep   Yes Historical Provider, MD  nitroGLYCERIN (NITROSTAT) 0.4 MG SL tablet Place 0.4 mg under the tongue every 5 (five) minutes as needed for chest pain.   Yes Historical Provider, MD  pantoprazole (PROTONIX) 40 MG tablet Take 40 mg by mouth 2 (two) times daily. Gi   Yes Historical Provider, MD  PRESCRIPTION MEDICATION Place 1 drop into both eyes 3 (three) times daily. Eye drops for burry vision   Yes Historical Provider, MD  valsartan (DIOVAN) 40 MG tablet Take 20-40 mg by mouth daily. 40mg  in the am and 20mg  in the pm   Yes Historical Provider, MD     History   Social History  . Marital Status: Married    Spouse Name: N/A    Number of Children: N/A  . Years of Education: N/A  Occupational History  . Retired    Social History Main Topics  . Smoking status: Former Smoker    Quit date: 08/03/1997  . Smokeless tobacco: Not on file  . Alcohol Use: No  . Drug Use: No  . Sexual Activity: Yes    Birth Control/ Protection: None   Other Topics Concern  . Not on file   Social History Narrative   Lives in apartment with wife.    Family Status  Relation Status Death Age  . Mother Deceased   . Father Deceased    Family History  Problem Relation Age of Onset  . Cancer Brother      ROS:  Full 14 point review of systems complete and found to be negative unless listed above.  Physical Exam: Blood pressure 153/61, pulse 66, temperature 98.3 F (36.8 C), temperature source Oral, resp. rate 19, height 5\' 8"  (1.727 m), weight 216 lb 12.8 oz (98.34 kg), SpO2 97.00%.  General: Well developed, well nourished, male in  no acute distress Head: Eyes PERRLA, No xanthomas.   Normocephalic and atraumatic, oropharynx without edema or exudate. Dentition: poor  Lungs:  Heart: HRRR S1 S2, no rub/gallop, Heart regular rate and rhythm with S1, S2  murmur. pulses are 2+ extrem.   Neck: No carotid bruits. No lymphadenopathy.  JVD. Abdomen: Bowel sounds present, abdomen soft and non-tender without masses or hernias noted. Msk:  No spine or cva tenderness. No weakness, no joint deformities or effusions. Extremities: No clubbing or cyanosis.  edema.  Neuro: Alert and oriented X 3. No focal deficits noted. Psych:  Good affect, responds appropriately Skin: No rashes or lesions noted.  Labs:   Lab Results  Component Value Date   WBC 5.7 09/10/2013   HGB 15.6 09/10/2013   HCT 44.8 09/10/2013   MCV 87.7 09/10/2013   PLT 281 09/10/2013     Recent Labs Lab 09/10/13 1540  NA 138  K 4.0  CL 101  CO2 26  BUN 15  CREATININE 0.97  CALCIUM 9.5  GLUCOSE 108*    Recent Labs  09/10/13 1600  TROPIPOC 0.01   Pro B Natriuretic peptide (BNP)  Date/Time Value Range Status  09/10/2013  3:40 PM 170.9* 0 - 125 pg/mL Final    Cardiac Cath: July 2014 Left main: Short, 30% mid stenosis.  Left Anterior Descending Artery: Moderate caliber vessel that courses to the apex. The proximal vessel has a long smooth 50% stenosis which is unchanged from last cath in 2013. The mid vessel has a 40% focal stenosis. The distal vessel becomes very small caliber and reaches the apex. The first diagonal is a very small caliber vessel with no stenosis. The second diagonal is a moderate caliber vessel with no obstructive disease.  Circumflex Artery: Large caliber vessel with large left sided posterolateral system with several OM branches with mild disease in the OM branches. No obstructive disease.  Right Coronary Artery: Large dominant vessel with 50% smooth tubular stenosis, unchanged from last cath. No obstructive disease.  Left  Ventricular Angiogram: LVEF=45% with hypokinesis of the apex, inferoapical wall.  Impression:  1. Moderate non-obstructive CAD  2. Mild LV systolic dysfunction  3. Elevated troponin in absence of chest pain.  Recommendations: Continue medical management.  ECG: 09/10/2013 SR, No acute ischemic changes   Vent. rate 64 BPM PR interval 186 ms QRS duration 106 ms QT/QTc 418/431 ms P-R-T axes 10 -34 47  Radiology:  Dg Chest Port 1 View 09/10/2013   CLINICAL DATA:  Chest tightness, pain, shortness of breath.  EXAM: PORTABLE CHEST - 1 VIEW  COMPARISON:  04/18/2013  FINDINGS: Low lung volumes. Cardiac silhouette enlarged, the aorta is tortuous and ectatic. Osseous structures unremarkable. No focal reason consolidation or focal infiltrates.  IMPRESSION: Cardiomegaly without evidence of acute cardiopulmonary disease.   Electronically Signed   By: Salome Holmes M.D.   On: 09/10/2013 15:49    ASSESSMENT AND PLAN:   The patient was seen today by Dr Clifton James, the patient evaluated and the data reviewed.   1. CAD/Unstable angina: Will admit to telemetry. r/o MI with serial cardiac markers. Will begin heparin overnight with high probability of ACS. Continue ASA/statin. Add Coreg 3.125 mg bid and see how tolerated. Likely cath in am.   2. HTN:  add Coreg and follow, continue ARB  3. Hyperlipemia - check lipid profile, continue statin.   SignedTheodore Demark, PA-C 09/10/2013 5:22 PM Beeper [phone]  I have personally seen and examined this patient with Theodore Demark, PA-C. I agree with the assessment and plan as outlined above and have addended the note with my findings on the examination and changes to the plan. Mr. Witzke is known to have moderate LAD and RCA stenosis by cath 7/14. He now has symptoms that are classic for unstable angina. Will admit, rule out with serial troponin, begin heparin drip, continue home meds. Check d-dimer to exclude PE. Will add to cath board tonight for cardiac cath  in am. Clear liquid breakfast then NPO in am.   Liana Camerer 09/10/2013 5:39 PM

## 2013-09-10 NOTE — ED Provider Notes (Signed)
CSN: [id number]     Arrival date & time 09/10/13  1519 History   First MD Initiated Contact with Patient 09/10/13 1602     Chief Complaint  Patient presents with  . Chest Pain   (Consider location/radiation/quality/duration/timing/severity/associated sxs/prior Treatment) HPI Comments: STORY [name] is a 72 y.o. male who is here for evaluation of chest pain with shortness of breath. That is caused by exertion and present for 3 days. There is no chest pain or shortness of breath at rest during the day. He does occasionally have trouble sleeping at night secondary to trouble breathing. There is no associated fever or cough back pain or abdominal pain. He has not used any nitroglycerin. He also complains of weakness when walking. He had cardiac catheterization in July 2014 is consistent with stable chronic coronary artery disease,  status post coronary stenting. He is prescribed CPAP, for sleep apnea, but does not wear it because it is uncomfortable. There are no other known modifying factors.  Patient is a 72 y.o. male presenting with chest pain. The history is provided by the patient and the spouse.  Chest Pain   Past Medical History  Diagnosis Date  . Coronary artery disease   . Hypertension   . Angina   . Cancer 1998    throat  . GERD (gastroesophageal reflux disease)   . Arthritis   . Shortness of breath   . Sleep apnea     does not use machine as directed  . Hyperlipidemia   . CAD (coronary artery disease), native coronary artery      PTCA distal CFX 2008 Dr. Jacinto Halim; non-obstructive by cath July 2014  . HTN (hypertension)    . Hyperlipemia    . Bradycardia, drug induced 11/04/2011  . H/O laryngeal cancer, treated with radiation therapy 11/04/2011  . Aortic root dilatation, history of in 2008     Past Surgical History  Procedure Laterality Date  . Cardiac catheterization    . Patella fracture surgery    . Colonoscopy w/ biopsies and polypectomy     Family History  Problem  Relation Age of Onset  . Cancer Brother    History  Substance Use Topics  . Smoking status: Former Smoker    Quit date: 08/03/1997  . Smokeless tobacco: Not on file  . Alcohol Use: No    Review of Systems  Cardiovascular: Positive for chest pain.  All other systems reviewed and are negative.    Allergies  Review of patient's allergies indicates no known allergies.  Home Medications   Current Outpatient Rx  Name  Route  Sig  Dispense  Refill  . aspirin EC 81 MG tablet   Oral   Take 1 tablet (81 mg total) by mouth daily.   30 tablet   0   . atorvastatin (LIPITOR) 80 MG tablet   Oral   Take 80 mg by mouth every other day.          . etodolac (LODINE) 400 MG tablet   Oral   Take 400 mg by mouth 3 (three) times daily as needed (for knee pain).         . fexofenadine (ALLEGRA) 180 MG tablet   Oral   Take 180 mg by mouth daily.         . fluticasone (FLONASE) 50 MCG/ACT nasal spray   Nasal   Place 2 sprays into the nose daily as needed for rhinitis.         Marland Kitchen LORazepam (  ATIVAN) 1 MG tablet   Oral   Take 1 mg by mouth at bedtime as needed for sleep. For sleep         . nitroGLYCERIN (NITROSTAT) 0.4 MG SL tablet   Sublingual   Place 0.4 mg under the tongue every 5 (five) minutes as needed for chest pain.         . pantoprazole (PROTONIX) 40 MG tablet   Oral   Take 40 mg by mouth 2 (two) times daily. Gi         . PRESCRIPTION MEDICATION   Both Eyes   Place 1 drop into both eyes 3 (three) times daily. Eye drops for burry vision         . valsartan (DIOVAN) 40 MG tablet   Oral   Take 20-40 mg by mouth daily. 40mg  in the am and 20mg  in the pm          BP 128/64  Pulse 62  Temp(Src) 98.3 F (36.8 C) (Oral)  Resp 19  Ht 5\' 8"  (1.727 m)  Wt 216 lb 12.8 oz (98.34 kg)  BMI 32.97 kg/m2  SpO2 97% Physical Exam  Nursing note and vitals reviewed. Constitutional: He is oriented to person, place, and time. He appears well-developed.  Elderly,  frail  HENT:  Head: Normocephalic and atraumatic.  Right Ear: External ear normal.  Left Ear: External ear normal.  Eyes: Conjunctivae and EOM are normal. Pupils are equal, round, and reactive to light.  Neck: Normal range of motion and phonation normal. Neck supple.  Cardiovascular: Normal rate, regular rhythm, normal heart sounds and intact distal pulses.   Pulmonary/Chest: Effort normal and breath sounds normal. He exhibits no bony tenderness.  Abdominal: Soft. Normal appearance. There is no tenderness.  Musculoskeletal: Normal range of motion.  Neurological: He is alert and oriented to person, place, and time. No cranial nerve deficit or sensory deficit. He exhibits normal muscle tone. Coordination normal.  Skin: Skin is warm, dry and intact.  Psychiatric: He has a normal mood and affect. His behavior is normal. Judgment and thought content normal.    ED Course  Procedures (including critical care time) Medications  aspirin chewable tablet 324 mg (324 mg Oral Given 09/10/13 1634)   Patient Vitals for the past 24 hrs:  BP Temp Temp src Pulse Resp SpO2 Height Weight  09/10/13 1810 128/64 mmHg - - 62 - - - -  09/10/13 1808 135/62 mmHg - - 63 - - - -  09/10/13 1807 154/70 mmHg - - 54 - - - -  09/10/13 1537 153/61 mmHg 98.3 F (36.8 C) Oral - 19 97 % - -  09/10/13 1532 - - - - - - 5\' 8"  (1.727 m) 216 lb 12.8 oz (98.34 kg)  09/10/13 1525 168/59 mmHg 98.4 F (36.9 C) Oral 66 18 94 % - -        Labs Review Labs Reviewed  BASIC METABOLIC PANEL - Abnormal; Notable for the following:    Glucose, Bld 108 (*)    GFR calc non Af Amer 79 (*)    All other components within normal limits  PRO B NATRIURETIC PEPTIDE - Abnormal; Notable for the following:    Pro B Natriuretic peptide (BNP) 170.9 (*)    All other components within normal limits  CBC  D-DIMER, QUANTITATIVE  POCT I-STAT TROPONIN I   Imaging Review Dg Chest Port 1 View  09/10/2013   CLINICAL DATA:  Chest tightness,  pain, shortness of breath.  EXAM: PORTABLE CHEST - 1 VIEW  COMPARISON:  04/18/2013  FINDINGS: Low lung volumes. Cardiac silhouette enlarged, the aorta is tortuous and ectatic. Osseous structures unremarkable. No focal reason consolidation or focal infiltrates.  IMPRESSION: Cardiomegaly without evidence of acute cardiopulmonary disease.   Electronically Signed   By: Salome Holmes M.D.   On: 09/10/2013 15:49    EKG Interpretation    Date/Time:  Thursday September 10 2013 15:22:18 EST Ventricular Rate:  64 PR Interval:  186 QRS Duration: 106 QT Interval:  418 QTC Calculation: 431 R Axis:   -34 Text Interpretation:  Normal sinus rhythm Left axis deviation Abnormal ECG Confirmed by Jojuan Champney  MD, Malike Foglio ([id number]) on 09/10/2013 4:11:27 PM           EKG essentially unchanged from 04/17/2013    MDM   1. Progressive angina   2. CAD (coronary artery disease)   3. HTN (hypertension)   4. Unstable angina      Clinical syndrome consistent with unstable angina. Pain-free in the emergency department with negative initial troponin. Recent admission to the hospital with catheterization when he had an NSTEMI. Patient is to have assessment by cardiology in the emergency department.  Nursing Notes Reviewed/ Care Coordinated, and agree without changes. Applicable Imaging Reviewed.  Interpretation of Laboratory Data incorporated into ED treatment  Plan: Admit  Flint Melter, MD 09/10/13 [phone]

## 2013-09-10 NOTE — Progress Notes (Signed)
ANTICOAGULATION CONSULT NOTE - Initial Consult  Pharmacy Consult for heparin Indication: chest pain/ACS  No Known Allergies  Patient Measurements: Height: 5\' 8"  (172.7 cm) Weight: 216 lb 12.8 oz (98.34 kg) IBW/kg (Calculated) : 68.4 Heparin Dosing Weight: 80 kg  Vital Signs: Temp: 98.5 F (36.9 C) (12/11 1812) Temp src: Oral (12/11 1537) BP: 128/64 mmHg (12/11 1810) Pulse Rate: 62 (12/11 1810)  Labs:  Recent Labs  09/10/13 1540  HGB 15.6  HCT 44.8  PLT 281  CREATININE 0.97    Estimated Creatinine Clearance: 76 ml/min (by C-G formula based on Cr of 0.97).   Medical History: Past Medical History  Diagnosis Date  . Coronary artery disease   . Hypertension   . Angina   . Cancer 1998    throat  . GERD (gastroesophageal reflux disease)   . Arthritis   . Shortness of breath   . Sleep apnea     does not use machine as directed  . Hyperlipidemia   . CAD (coronary artery disease), native coronary artery      PTCA distal CFX 2008 Dr. Jacinto Halim; non-obstructive by cath July 2014  . HTN (hypertension)    . Hyperlipemia    . Bradycardia, drug induced 11/04/2011  . H/O laryngeal cancer, treated with radiation therapy 11/04/2011  . Aortic root dilatation, history of in 2008      Medications:  Scheduled:  . [START ON 09/11/2013] aspirin EC  81 mg Oral Daily  . atorvastatin  80 mg Oral Once  . [START ON 09/12/2013] atorvastatin  80 mg Oral Q48H  . irbesartan  150 mg Oral Daily  . [START ON 09/11/2013] loratadine  10 mg Oral Daily  . nitroGLYCERIN  0.5 inch Topical Q6H  . pantoprazole  40 mg Oral BID  . PRESCRIPTION MEDICATION 1 drop  1 drop Both Eyes TID    Assessment: 72 yr old male presented to the ED with SOB and chest pain. He has a history of nonobstructive CAD, cath in July 2014. He is "tired and weak" and has chest pain on exertion for 3 days. He will be admitted for cath tomorrow and pharmacy to dose heparin. Goal of Therapy:  Heparin level 0.3-0.7  units/ml Monitor platelets by anticoagulation protocol: Yes   Plan:  Heparin bolus: 4000 units Heparing drip: 1000 units/hr Heparin level with AM labs. Pt to go to cath lab tomorrow.  Eugene Garnet 09/10/2013,9:23 PM

## 2013-09-11 ENCOUNTER — Encounter (HOSPITAL_COMMUNITY): Payer: Self-pay | Admitting: *Deleted

## 2013-09-11 ENCOUNTER — Encounter (HOSPITAL_COMMUNITY)
Admission: EM | Disposition: A | Discharge status: Home / Self Care | Payer: Non-veteran care | Source: Home / Self Care | Attending: Cardiovascular Disease

## 2013-09-11 DIAGNOSIS — I200 Unstable angina: Secondary | ICD-10-CM | POA: Diagnosis not present

## 2013-09-11 DIAGNOSIS — I2510 Atherosclerotic heart disease of native coronary artery without angina pectoris: Secondary | ICD-10-CM | POA: Diagnosis not present

## 2013-09-11 HISTORY — PX: PERCUTANEOUS CORONARY STENT INTERVENTION (PCI-S): SHX5485

## 2013-09-11 HISTORY — PX: LEFT HEART CATHETERIZATION WITH CORONARY ANGIOGRAM: SHX5451

## 2013-09-11 LAB — COMPREHENSIVE METABOLIC PANEL
ALT: 10 U/L (ref 0–53)
Albumin: 3.3 g/dL — ABNORMAL LOW (ref 3.5–5.2)
Alkaline Phosphatase: 71 U/L (ref 39–117)
Calcium: 9 mg/dL (ref 8.4–10.5)
Chloride: 103 mEq/L (ref 96–112)
GFR calc Af Amer: >90 mL/min (ref >90)
Glucose, Bld: 110 mg/dL — ABNORMAL HIGH (ref 70–99)
Sodium: 138 mEq/L (ref 135–145)
Total Bilirubin: 0.6 mg/dL (ref 0.3–1.2)
Total Protein: 5.9 g/dL — ABNORMAL LOW (ref 6.0–8.3)

## 2013-09-11 LAB — CBC WITH DIFFERENTIAL/PLATELET
Basophils Absolute: 0 10*3/uL (ref 0.0–0.1)
Eosinophils Relative: 1 % (ref 0–5)
Hemoglobin: 14.9 g/dL (ref 13.0–17.0)
Lymphocytes Relative: 40 % (ref 12–46)
Lymphs Abs: 2 10*3/uL (ref 0.7–4.0)
MCH: 30.2 pg (ref 26.0–34.0)
MCV: 86.2 fL (ref 78.0–100.0)
Neutrophils Relative %: 52 % (ref 43–77)
Platelets: 249 10*3/uL (ref 150–400)
RBC: 4.94 MIL/uL (ref 4.22–5.81)
WBC: 5 10*3/uL (ref 4.0–10.5)

## 2013-09-11 LAB — TROPONIN I
Troponin I: <0.3 ng/mL (ref <0.30)
Troponin I: <0.3 ng/mL (ref <0.30)

## 2013-09-11 LAB — CBC
Hemoglobin: 14.5 g/dL (ref 13.0–17.0)
MCH: 30.1 pg (ref 26.0–34.0)
Platelets: 252 10*3/uL (ref 150–400)
RBC: 4.82 MIL/uL (ref 4.22–5.81)
RDW: 13.5 % (ref 11.5–15.5)
WBC: 5.5 10*3/uL (ref 4.0–10.5)

## 2013-09-11 LAB — TSH: TSH: 1.999 u[IU]/mL (ref 0.350–4.500)

## 2013-09-11 LAB — POCT ACTIVATED CLOTTING TIME: Activated Clotting Time: >1000 seconds

## 2013-09-11 LAB — HEPARIN LEVEL (UNFRACTIONATED): Heparin Unfractionated: <0.1 IU/mL — ABNORMAL LOW (ref 0.30–0.70)

## 2013-09-11 SURGERY — LEFT HEART CATHETERIZATION WITH CORONARY ANGIOGRAM
Anesthesia: LOCAL

## 2013-09-11 MED ORDER — LIDOCAINE HCL (PF) 1 % IJ SOLN
INTRAMUSCULAR | Status: AC
  Filled 2013-09-11: qty 30

## 2013-09-11 MED ORDER — MIDAZOLAM HCL 2 MG/2ML IJ SOLN
INTRAMUSCULAR | Status: AC
  Filled 2013-09-11: qty 2

## 2013-09-11 MED ORDER — CLOPIDOGREL BISULFATE 300 MG PO TABS
ORAL_TABLET | ORAL | Status: AC
  Filled 2013-09-11: qty 1

## 2013-09-11 MED ORDER — VERAPAMIL HCL 2.5 MG/ML IV SOLN
INTRAVENOUS | Status: AC
  Filled 2013-09-11: qty 2

## 2013-09-11 MED ORDER — HEPARIN BOLUS VIA INFUSION
2000.0000 [IU] | Freq: Once | INTRAVENOUS | Status: AC
  Administered 2013-09-11: 2000 [IU] via INTRAVENOUS
  Filled 2013-09-11: qty 2000

## 2013-09-11 MED ORDER — HEPARIN (PORCINE) IN NACL 2-0.9 UNIT/ML-% IJ SOLN
INTRAMUSCULAR | Status: AC
  Filled 2013-09-11: qty 1000

## 2013-09-11 MED ORDER — CLOPIDOGREL BISULFATE 75 MG PO TABS
75.0000 mg | ORAL_TABLET | Freq: Every day | ORAL | Status: DC
  Administered 2013-09-12: 75 mg via ORAL
  Filled 2013-09-11: qty 1

## 2013-09-11 MED ORDER — SODIUM CHLORIDE 0.9 % IV SOLN
0.2500 mg/kg/h | INTRAVENOUS | Status: DC
  Filled 2013-09-11: qty 250

## 2013-09-11 MED ORDER — NITROGLYCERIN 0.2 MG/ML ON CALL CATH LAB
INTRAVENOUS | Status: AC
  Filled 2013-09-11: qty 1

## 2013-09-11 MED ORDER — BIVALIRUDIN 250 MG IV SOLR
INTRAVENOUS | Status: AC
  Filled 2013-09-11: qty 250

## 2013-09-11 MED ORDER — SODIUM CHLORIDE 0.9 % IV SOLN: INTRAVENOUS | Status: AC

## 2013-09-11 MED ORDER — FENTANYL CITRATE 0.05 MG/ML IJ SOLN
INTRAMUSCULAR | Status: AC
  Filled 2013-09-11: qty 2

## 2013-09-11 MED ORDER — HYPROMELLOSE 0.3 % OP SOLN
1.0000 [drp] | Freq: Three times a day (TID) | OPHTHALMIC | Status: DC
  Administered 2013-09-11 (×2): 1 [drp] via OPHTHALMIC

## 2013-09-11 NOTE — Progress Notes (Signed)
TR BAND REMOVAL  LOCATION:    right radial  DEFLATED PER PROTOCOL:    yes  TIME BAND OFF / DRESSING APPLIED:    1425   SITE UPON ARRIVAL:    Level 0  SITE AFTER BAND REMOVAL:    Level 0  REVERSE ALLEN'S TEST:     positive  CIRCULATION SENSATION AND MOVEMENT:    Within Normal Limits   yes  COMMENTS:   Tolerated produre well

## 2013-09-11 NOTE — Progress Notes (Signed)
ANTICOAGULATION CONSULT NOTE - Follow Up Consult  Pharmacy Consult for Heparin  Indication: chest pain/ACS  No Known Allergies  Patient Measurements: Height: 5\' 8"  (172.7 cm) Weight: 215 lb 8 oz (97.75 kg) IBW/kg (Calculated) : 68.4 Heparin Dosing Weight: ~80kg  Vital Signs: Temp: 97.6 F (36.4 C) (12/12 0513) Temp src: Oral (12/12 0513) BP: 106/59 mmHg (12/12 0513) Pulse Rate: 59 (12/12 0513)  Labs:  Recent Labs  09/10/13 1540 09/11/13 0007 09/11/13 0503  HGB 15.6 14.9 14.5  HCT 44.8 42.6 42.0  PLT 281 249 252  LABPROT  --  12.8  --   INR  --  0.98  --   HEPARINUNFRC  --   --  <0.10*  CREATININE 0.97  --  0.87  TROPONINI  --  <0.30 <0.30   Estimated Creatinine Clearance: 84.5 ml/min (by C-G formula based on Cr of 0.87).  Medications: Heparin 1000 units/hr  Assessment: 72 y/o M here with CP/SOB. Plan for cath in the AM. HL is <0.10.   Goal of Therapy:  Heparin level 0.3-0.7 units/ml Monitor platelets by anticoagulation protocol: Yes   Plan:  -Heparin 2000 units BOLUS x 1  -Increase heparin drip to 1250 units/hr -8 hour HL at 1500, pending cath -Daily CBC/HL, pending cath -Monitor for bleeding  Thank you for allowing me to take part in this patient's care,  Abran Duke, PharmD Clinical Pharmacist Phone: [phone] Pager: [phone] 09/11/2013 6:37 AM

## 2013-09-11 NOTE — Progress Notes (Signed)
Site area: right groin  Site Prior to Removal:  Level 0  Pressure Applied For 20 MINUTES    Minutes Beginning at 1430  Manual:   yes  Patient Status During Pull:  AAO X 4  Post Pull Groin Site:  Level 0  Post Pull Instructions Given:  yes  Post Pull Pulses Present:  yes  Dressing Applied:  yes  Comments:  Tolerated procedure well

## 2013-09-11 NOTE — Progress Notes (Signed)
Patient ID: [name], male   DOB: 02/25/1941, 72 y.o.   MRN: [id number]    Patient was admitted yesterday. There is known coronary disease. D-dimer was normal. The plan was to proceed with catheterization today assuming his d-dimer was normal. He is stable. He has been given clear liquid breakfast. He is to be n.p.o. after that. I have called the cath lab and he is listed to be cath later this afternoon.  Jeff Jaecion Dempster, MD  

## 2013-09-11 NOTE — Interval H&P Note (Signed)
Cath Lab Visit (complete for each Cath Lab visit)  Clinical Evaluation Leading to the Procedure:   ACS: yes  Non-ACS:    Anginal Classification: CCS IV  Anti-ischemic medical therapy: Maximal Therapy (2 or more classes of medications)  Non-Invasive Test Results: No non-invasive testing performed  Prior CABG: No previous CABG      History and Physical Interval Note:  09/11/2013 9:09 AM  [name]  has presented today for surgery, with the diagnosis of Chest pain  The various methods of treatment have been discussed with the patient and family. After consideration of risks, benefits and other options for treatment, the patient has consented to  Procedure(s): LEFT HEART CATHETERIZATION WITH CORONARY ANGIOGRAM (N/A) as a surgical intervention .  The patient's history has been reviewed, patient examined, no change in status, stable for surgery.  I have reviewed the patient's chart and labs.  Questions were answered to the patient's satisfaction.     Neeva Trew [employer]

## 2013-09-11 NOTE — H&P (View-Only) (Signed)
Patient ID: [name], male   DOB: 1941/03/27, 72 y.o.   MRN: [id number]    Patient was admitted yesterday. There is known coronary disease. D-dimer was normal. The plan was to proceed with catheterization today assuming his d-dimer was normal. He is stable. He has been given clear liquid breakfast. He is to be n.p.o. after that. I have called the cath lab and he is listed to be cath later this afternoon.  Jerral Bonito, MD

## 2013-09-11 NOTE — CV Procedure (Signed)
     Cardiac Catheterization Operative Report  EDRICK WHITEHORN [id number] 12/12/201410:51 AM VYAS,DHRUV B., MD  Procedure Performed:  1. PTCA/DES x 1 proximal LAD   Operator: Verne Carrow, MD  Indication:  72 yo male with history of CAD admitted with unstable angina (class III). Cardiac markers negative. Diagnostic cath per Dr. Shirlee Latch this am. I am asked to perform PCI of the proximal LAD stenosis.                                   Procedure Details: The risks, benefits, complications, treatment options, and expected outcomes were discussed with the patient before the diagnostic procedure. He was found to have severe stenosis in the proximal LAD. Informed consent for PCI was on the chart. When I entered the case, there was a 5/6 [place] sheath present in the right radial artery. I attempted to engage the left main with a guiding catheter but the patient had severe tortuosity in his right subclavian artery making a large loop. I could not engage the left main. He also had spasm in his right radial/brachial artery following the diagnostic cath with intense pain in his right arm. I proceeded to gain access in the right femoral artery. The right groin was prepped and draped in a sterile fashion. 1% lidocaine was used for local anesthesia. A 6 French sheath was placed in the right femoral artery. I then engaged the left main with a XB LAD 3.5 guiding catheter. A weight based bolus of Angiomax was given. When the ACT was over 200, I passed a Cougar IC wire down the LAD. A 2.5 x 15 mm balloon was used to pre-dilate the severe stenosis in the proximal to mid LAD. A 3.0 x 18 mm Xience Alpine DES was deployed in the proximal LAD. The stent was post-dilated with a 3.25 x 15 mm [place] balloon. The stenosis was taken from 90% down to 0%. There were no immediate complications. The patient was taken to the recovery area in stable condition. He had been loaded with Plavix 600 mg po x 1 at the end of the  diagnostic case.   Hemodynamic Findings: Central aortic pressure: 128/65  Impression: 1. Unstable angina secondary to severe stenosis proximal LAD 2. Successful PTCA/DES x 1 proximal LAD  Recommendations: He will need dual anti-platelet therapy with ASA and Plavix for one year. Disharge home tomorrow am if stable.        Complications:  None; patient tolerated the procedure well.

## 2013-09-11 NOTE — CV Procedure (Signed)
    Cardiac Catheterization Procedure Note  Name: [name] MRN: [id number] DOB: [birth year]  Procedure: Selective Coronary Angiography  Indication: Unstable angina   Procedural Details: Allen's testing was positive on the right wrist.  The right wrist was prepped, draped, and anesthetized with 1% lidocaine. Using the modified Seldinger technique, a 5 French sheath was introduced into the right radial artery. 3 mg of verapamil was administered through the sheath, weight-based unfractionated heparin was administered intravenously. Standard Judkins catheters were used for selective coronary angiography.  Left ventriculography was not done due to the development of radial artery spasm. Catheter exchanges were performed over an exchange length guidewire. There were no immediate procedural complications. The patient received Plavix 600 mg and will undergo PCI.  Procedural Findings: Hemodynamics: AO 107/52  Coronary angiography: Coronary dominance: right  Left mainstem: Short, no significant disease.  Left anterior descending (LAD): There was a 90% proximal LAD stenosis at the takeoff of a small 1st diagonal.  This is progressive.  This was closely followed by a moderate 2nd diagonal without significant disease. 30-40% stenosis after D2.   Left circumflex (LCx): Large branching PLOM with 60% ostial stenosis.   Right coronary artery (RCA): 60-70% mid RCA stenosis (no change).   Left ventriculography: Not done with radial artery spasm.   Final Conclusions:  Progressive proximal LAD stenosis, now 90%.  I suspect this is the culprit for his symptoms. Dr. Clifton James to perform PCI.    [name] 09/11/2013, 9:51 AM

## 2013-09-11 NOTE — Progress Notes (Signed)
Utilization Review Completed.Rochele Lueck T12/08/2013  

## 2013-09-12 LAB — CBC
Hemoglobin: 15.4 g/dL (ref 13.0–17.0)
MCH: 30.5 pg (ref 26.0–34.0)
MCHC: 34.9 g/dL (ref 30.0–36.0)
MCV: 87.3 fL (ref 78.0–100.0)
Platelets: 247 10*3/uL (ref 150–400)
RDW: 13.6 % (ref 11.5–15.5)
WBC: 7.4 10*3/uL (ref 4.0–10.5)

## 2013-09-12 LAB — BASIC METABOLIC PANEL
CO2: 25 mEq/L (ref 19–32)
Calcium: 9 mg/dL (ref 8.4–10.5)
Chloride: 105 mEq/L (ref 96–112)
Creatinine, Ser: 0.92 mg/dL (ref 0.50–1.35)
GFR calc Af Amer: >90 mL/min (ref >90)
GFR calc non Af Amer: 81 mL/min — ABNORMAL LOW (ref >90)

## 2013-09-12 MED ORDER — INFLUENZA VAC SPLIT QUAD 0.5 ML IM SUSP
0.5000 mL | INTRAMUSCULAR | Status: AC
  Administered 2013-09-12: 0.5 mL via INTRAMUSCULAR
  Filled 2013-09-12: qty 0.5

## 2013-09-12 MED ORDER — CLOPIDOGREL BISULFATE 75 MG PO TABS: 75.0000 mg | ORAL_TABLET | Freq: Every day | ORAL | Status: DC

## 2013-09-12 NOTE — Progress Notes (Signed)
Patient ID: [name], male   DOB: 1941-08-17, 72 y.o.   MRN: [id number] Subjective:  No chest pain or sob.  Objective:  Vital Signs in the last 24 hours: Temp:  [97.4 F (36.3 C)-98.4 F (36.9 C)] 98.1 F (36.7 C) (12/13 0725) Pulse Rate:  [49-76] 71 (12/13 0725) Resp:  [18-20] 18 (12/13 0725) BP: (124-174)/(48-70) 167/69 mmHg (12/13 0725) SpO2:  [92 %-96 %] 93 % (12/13 0725) Weight:  [210 lb 15.7 oz (95.7 kg)] 210 lb 15.7 oz (95.7 kg) (12/13 0345)  Intake/Output from previous day: 12/12 0701 - 12/13 0700 In: 750 [P.O.:240; I.V.:510] Out: 1300 [Urine:1300] Intake/Output from this shift:    Physical Exam: Well appearing NAD HEENT: Unremarkable Neck:  No JVD, no thyromegally Back:  No CVA tenderness Lungs:  Clear with no wheezes, rales, or rhonchi HEART:  Regular rate rhythm, no murmurs, no rubs, no clicks Abd:  soft, positive bowel sounds, no organomegally, no rebound, no guarding Ext:  2 plus pulses, no edema, no cyanosis, no clubbing Skin:  No rashes no nodules Neuro:  CN II through XII intact, motor grossly intact  Lab Results:  Recent Labs  09/11/13 0503 09/12/13 0620  WBC 5.5 7.4  HGB 14.5 15.4  PLT 252 247    Recent Labs  09/11/13 0503 09/12/13 0620  NA 138 138  K 3.7 4.1  CL 103 105  CO2 24 25  GLUCOSE 110* 112*  BUN 14 11  CREATININE 0.87 0.92    Recent Labs  09/11/13 0007 09/11/13 0503  TROPONINI <0.30 <0.30   Hepatic Function Panel  Recent Labs  09/11/13 0503  PROT 5.9*  ALBUMIN 3.3*  AST 14  ALT 10  ALKPHOS 71  BILITOT 0.6   No results found for this basename: CHOL,  in the last 72 hours No results found for this basename: PROTIME,  in the last 72 hours  Imaging: Dg Chest Port 1 View  09/10/2013   CLINICAL DATA:  Chest tightness, pain, shortness of breath.  EXAM: PORTABLE CHEST - 1 VIEW  COMPARISON:  04/18/2013  FINDINGS: Low lung volumes. Cardiac silhouette enlarged, the aorta is tortuous and ectatic. Osseous  structures unremarkable. No focal reason consolidation or focal infiltrates.  IMPRESSION: Cardiomegaly without evidence of acute cardiopulmonary disease.   Electronically Signed   By: Salome Holmes M.D.   On: 09/10/2013 15:49    Cardiac Studies: Tele - nsr Assessment/Plan:  1. [place] 2. S/p PCI LAD Rec: ok for discharge home. followup Dr. Sanjuana Kava. Continue ASA/plavix for a year.  LOS: 2 days    Gregg Taylor,M.D. 09/12/2013, 8:12 AM

## 2013-09-12 NOTE — Progress Notes (Signed)
CARDIAC REHAB PHASE I   PRE:  Rate/Rhythm: 72 sr  BP:  Sitting: 142/67     SaO2: 94% ra  MODE:  Ambulation: 350 ft   POST:  Rate/Rhythm: 70 sr  BP:  Sitting: 157/68     SaO2: 94% ra  8:50am-9:30am Patient walked at a slow but steady pace with cane.  Patient stated that he did not feel any chest pain or sob.  Patient is interested in cardiac rehab at [facility].  Patient was left with wife and call bell in reach.    Theresa Duty, [place] 09/12/2013 9:23 AM

## 2013-09-12 NOTE — Discharge Summary (Signed)
Physician Discharge Summary  Patient ID: [name] MRN: [id number] DOB/AGE: [birth year] 72 y.o.  Admit date: 09/10/2013 Discharge date: 09/12/2013  Primary Discharge Diagnosis:  1. Unstable Angina 2, CAD S/P DES to LAD 09/11/13  Secondary Discharge Diagnosis: 1. Hypertension 2. Hyperlipidemia 3. GERD  Significant Diagnostic Studies: 1. Cardiac Cath 09/11/13 [facility] Coronary dominance: right  Left mainstem: Short, no significant disease.  Left anterior descending (LAD): There was a 90% proximal LAD stenosis at the takeoff of a small 1st diagonal. This is progressive. This was closely followed by a moderate 2nd diagonal without significant disease. 30-40% stenosis after D2.  Left circumflex (LCx): Large branching PLOM with 60% ostial stenosis.  Right coronary artery (RCA): 60-70% mid RCA stenosis (no change).  Left ventriculography: Not done with radial artery spasm.   2. PCI 09/11/13 . A 3.0 x 18 mm Xience Alpine DES was deployed in the proximal LAD 1. Unstable angina secondary to severe stenosis proximal LAD  2. Successful PTCA/DES x 1 proximal LAD  Hospital Course: Mr. Bache is a 72 year old patient of Dr. Clifton James who presented to [facility] with complaints of unstable angina, class III dyspnea on exertion, with known history of nonobstructive CAD by cath in July of 2014. T2 progressive symptoms, the patient was planned for cardiac catheterization which was completed on 09/01/2013 by Dr. Shirlee Latch. This demonstrated progressive proximal LAD stenosis now a 90%. He was referred to Dr. Clifton James the same day for PCI. A Xience Alpine DES was deployed to the proximal LAD by Dr. Clifton James. The patient was placed on dual antiplatelet therapy to include Plavix 75 mg, and aspirin daily. This will need to be continued for a minimum of one year.    The patient was seen and examined by Dr. Sharrell Ku on day of discharge and found to be stable. No recurrence of chest pain or shortness  of breath. Patient's cath site with some be clean dry and free of hematoma bleeding or infection. Extensive education was provided to the patient via nurses and cardiac rehabilitation. Prescription was provided for Plavix. The patient will followup with Dr. Clifton James in 2 weeks post hospitalization for ongoing assessment and management of CAD.  Discharge Exam: Blood pressure 167/69, pulse 71, temperature 98.1 F (36.7 C), temperature source Oral, resp. rate 18, height 5\' 8"  (1.727 m), weight 210 lb 15.7 oz (95.7 kg), SpO2 93.00%.   Labs:   Lab Results  Component Value Date   WBC 7.4 09/12/2013   HGB 15.4 09/12/2013   HCT 44.1 09/12/2013   MCV 87.3 09/12/2013   PLT 247 09/12/2013     Recent Labs Lab 09/11/13 0503 09/12/13 0620  NA 138 138  K 3.7 4.1  CL 103 105  CO2 24 25  BUN 14 11  CREATININE 0.87 0.92  CALCIUM 9.0 9.0  PROT 5.9*  --   BILITOT 0.6  --   ALKPHOS 71  --   ALT 10  --   AST 14  --   GLUCOSE 110* 112*   Lab Results  Component Value Date   CKTOTAL 74 11/05/2011   CKMB 2.7 11/05/2011   TROPONINI <0.30 09/11/2013    Lab Results  Component Value Date   CHOL 172 04/18/2013   CHOL 106 11/05/2011   CHOL  Value: 132        ATP III CLASSIFICATION:  <200     mg/dL   Desirable  403-474  mg/dL   Borderline High  >=259    mg/dL  High 03/14/2007   Lab Results  Component Value Date   HDL 41 04/18/2013   HDL 41 10/06/1094   HDL 36* 0/45/4098   Lab Results  Component Value Date   LDLCALC 109* 04/18/2013   LDLCALC 44 11/05/2011   LDLCALC  Value: 77        Total Cholesterol/HDL:CHD Risk Coronary Heart Disease Risk Table                     Men   Women  1/2 Average Risk   3.4   3.3 03/14/2007   Lab Results  Component Value Date   TRIG 108 04/18/2013   TRIG 103 11/05/2011   TRIG 94 03/14/2007   Lab Results  Component Value Date   CHOLHDL 4.2 04/18/2013   CHOLHDL 2.6 11/05/2011   CHOLHDL 3.7 03/14/2007   No results found for this basename: LDLDIRECT      Radiology: Dg Chest  Port 1 View  09/10/2013   CLINICAL DATA:  Chest tightness, pain, shortness of breath.  EXAM: PORTABLE CHEST - 1 VIEW  COMPARISON:  04/18/2013  FINDINGS: Low lung volumes. Cardiac silhouette enlarged, the aorta is tortuous and ectatic. Osseous structures unremarkable. No focal reason consolidation or focal infiltrates.  IMPRESSION: Cardiomegaly without evidence of acute cardiopulmonary disease.   Electronically Signed   By: Salome Holmes M.D.   On: 09/10/2013 15:49    EKG: Marked sinus bradycardia with 1st degree A-V block rate of 48 bpm. Left axis deviation Incomplete right bundle branch block  FOLLOW UP PLANS AND APPOINTMENTS     Discharge Orders   Future Orders Complete By Expires   Diet - low sodium heart healthy  As directed    Increase activity slowly  As directed        Medication List         aspirin EC 81 MG tablet  Take 1 tablet (81 mg total) by mouth daily.     atorvastatin 80 MG tablet  Commonly known as:  LIPITOR  Take 80 mg by mouth every other day.     clopidogrel 75 MG tablet  Commonly known as:  PLAVIX  Take 1 tablet (75 mg total) by mouth daily with breakfast.     etodolac 400 MG tablet  Commonly known as:  LODINE  Take 400 mg by mouth 3 (three) times daily as needed (for knee pain).     fexofenadine 180 MG tablet  Commonly known as:  ALLEGRA  Take 180 mg by mouth daily.     fluticasone 50 MCG/ACT nasal spray  Commonly known as:  FLONASE  Place 2 sprays into the nose daily as needed for rhinitis.     LORazepam 1 MG tablet  Commonly known as:  ATIVAN  Take 1 mg by mouth at bedtime as needed for sleep. For sleep     nitroGLYCERIN 0.4 MG SL tablet  Commonly known as:  NITROSTAT  Place 0.4 mg under the tongue every 5 (five) minutes as needed for chest pain.     pantoprazole 40 MG tablet  Commonly known as:  PROTONIX  Take 40 mg by mouth 2 (two) times daily. Gi     PRESCRIPTION MEDICATION  Place 1 drop into both eyes 3 (three) times daily. Eye  drops for burry vision     valsartan 40 MG tablet  Commonly known as:  DIOVAN  Take 20-40 mg by mouth daily. 40mg  in the am and 20mg  in the pm  Follow-up Information   Follow up with Verne Carrow, MD. (Our office will call you for appt.)    Specialty:  Cardiology   Contact information:   [address] [place] [address] [phone]       Time spent with patient to include physician time: 30 minutes Signed: Joni Reining 09/12/2013, 8:59 AM Co-Sign MD

## 2013-09-14 MED FILL — Sodium Chloride IV Soln 0.9%: INTRAVENOUS | Qty: 50 | Status: AC

## 2013-09-25 ENCOUNTER — Ambulatory Visit (INDEPENDENT_AMBULATORY_CARE_PROVIDER_SITE_OTHER): Payer: Medicare Other | Admitting: Nurse Practitioner

## 2013-09-25 ENCOUNTER — Encounter: Payer: Self-pay | Admitting: Nurse Practitioner

## 2013-09-25 VITALS — BP 170/80 | HR 66 | Ht 68.0 in | Wt 221.4 lb

## 2013-09-25 DIAGNOSIS — R0600 Dyspnea, unspecified: Secondary | ICD-10-CM

## 2013-09-25 DIAGNOSIS — Z9861 Coronary angioplasty status: Secondary | ICD-10-CM

## 2013-09-25 DIAGNOSIS — I2510 Atherosclerotic heart disease of native coronary artery without angina pectoris: Secondary | ICD-10-CM

## 2013-09-25 DIAGNOSIS — Z955 Presence of coronary angioplasty implant and graft: Secondary | ICD-10-CM

## 2013-09-25 DIAGNOSIS — R0609 Other forms of dyspnea: Secondary | ICD-10-CM

## 2013-09-25 LAB — CBC WITH DIFFERENTIAL/PLATELET
Basophils Absolute: 0 10*3/uL (ref 0.0–0.1)
Basophils Relative: 0.4 % (ref 0.0–3.0)
Eosinophils Absolute: 0.1 10*3/uL (ref 0.0–0.7)
Eosinophils Relative: 1.4 % (ref 0.0–5.0)
HCT: 43.3 % (ref 39.0–52.0)
Hemoglobin: 14.5 g/dL (ref 13.0–17.0)
Lymphocytes Relative: 36.1 % (ref 12.0–46.0)
Lymphs Abs: 1.7 10*3/uL (ref 0.7–4.0)
MCHC: 33.5 g/dL (ref 30.0–36.0)
MCV: 88.8 fl (ref 78.0–100.0)
Monocytes Absolute: 0.4 10*3/uL (ref 0.1–1.0)
Monocytes Relative: 8.1 % (ref 3.0–12.0)
Neutro Abs: 2.6 10*3/uL (ref 1.4–7.7)
Neutrophils Relative %: 54 % (ref 43.0–77.0)
Platelets: 285 10*3/uL (ref 150.0–400.0)
RBC: 4.88 Mil/uL (ref 4.22–5.81)
RDW: 14.3 % (ref 11.5–14.6)
WBC: 4.7 10*3/uL (ref 4.5–10.5)

## 2013-09-25 LAB — BASIC METABOLIC PANEL
BUN: 18 mg/dL (ref 6–23)
CO2: 28 mEq/L (ref 19–32)
Calcium: 9.3 mg/dL (ref 8.4–10.5)
Chloride: 103 mEq/L (ref 96–112)
Creatinine, Ser: 1 mg/dL (ref 0.4–1.5)
GFR: 82.29 mL/min (ref >60.00)
Glucose, Bld: 119 mg/dL — ABNORMAL HIGH (ref 70–99)
Potassium: 3.8 mEq/L (ref 3.5–5.1)
Sodium: 137 mEq/L (ref 135–145)

## 2013-09-25 LAB — BRAIN NATRIURETIC PEPTIDE: Pro B Natriuretic peptide (BNP): 34 pg/mL (ref 0.0–100.0)

## 2013-09-25 MED ORDER — AMLODIPINE BESYLATE 5 MG PO TABS: 5.0000 mg | ORAL_TABLET | Freq: Every day | ORAL | Status: DC
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
## 2013-09-25 NOTE — Patient Instructions (Addendum)
Stay on your current medicines but I am adding Norvasc 5 mg a day  We need to check labs today  Use the breath right strips and your CPAP at night  We will get an ultrasound of your heart  Cut back on your salt  See me and Dr. Clifton James in 2 weeks  Use your NTG if needed - Use your NTG under your tongue for recurrent chest pain. May take one tablet every 5 minutes. If you are still having discomfort after 3 tablets in 15 minutes, call 911.  Call the [facility] office at [phone] if you have any questions, problems or concerns.

## 2013-10-12 ENCOUNTER — Ambulatory Visit (HOSPITAL_COMMUNITY)
Admission: RE | Admit: 2013-10-12 | Discharge: 2013-10-12 | Disposition: A | Discharge status: Home / Self Care | Payer: Non-veteran care | Source: Ambulatory Visit | Attending: Nurse Practitioner | Admitting: Nurse Practitioner

## 2013-10-12 DIAGNOSIS — Z955 Presence of coronary angioplasty implant and graft: Secondary | ICD-10-CM

## 2013-10-12 DIAGNOSIS — I359 Nonrheumatic aortic valve disorder, unspecified: Secondary | ICD-10-CM | POA: Insufficient documentation

## 2013-10-12 DIAGNOSIS — R0609 Other forms of dyspnea: Secondary | ICD-10-CM | POA: Insufficient documentation

## 2013-10-12 DIAGNOSIS — E785 Hyperlipidemia, unspecified: Secondary | ICD-10-CM | POA: Insufficient documentation

## 2013-10-12 DIAGNOSIS — R0989 Other specified symptoms and signs involving the circulatory and respiratory systems: Secondary | ICD-10-CM | POA: Insufficient documentation

## 2013-10-12 DIAGNOSIS — I2510 Atherosclerotic heart disease of native coronary artery without angina pectoris: Secondary | ICD-10-CM | POA: Insufficient documentation

## 2013-10-12 DIAGNOSIS — I77819 Aortic ectasia, unspecified site: Secondary | ICD-10-CM | POA: Insufficient documentation

## 2013-10-12 DIAGNOSIS — I059 Rheumatic mitral valve disease, unspecified: Secondary | ICD-10-CM | POA: Insufficient documentation

## 2013-10-12 NOTE — Progress Notes (Signed)
2D Echo Performed 10/12/2013    Marygrace Drought, RCS

## 2013-10-13 ENCOUNTER — Ambulatory Visit (INDEPENDENT_AMBULATORY_CARE_PROVIDER_SITE_OTHER): Payer: Non-veteran care | Admitting: Nurse Practitioner

## 2013-10-13 ENCOUNTER — Encounter: Payer: Self-pay | Admitting: Nurse Practitioner

## 2013-10-13 VITALS — BP 140/70 | HR 59 | Ht 68.0 in | Wt 218.0 lb

## 2013-10-13 DIAGNOSIS — I2510 Atherosclerotic heart disease of native coronary artery without angina pectoris: Secondary | ICD-10-CM

## 2013-10-13 NOTE — Patient Instructions (Addendum)
I think you are doing well  See Dr. Angelena Form back in 4 months  Will send a note to cardiac rehab   Stay active  Stay on your current medicines  Call the [place] office at [phone] if you have any questions, problems or concerns.

## 2013-10-13 NOTE — Progress Notes (Signed)
[name] Date of Birth: 10-04-1940 Medical Record [id number]  History of Present Illness: [name] is seen back today for a follow up visit. Seen for Dr. Angelena Smith. He has HTN, HLD and GERD. Known nonobstructive CAD per cath back in July of 2014. He has noted in his history an enlargement of his aortic root - not noted on recent echo.   Admitted last month with unstable angina - was recathed and had DES to the proximal LAD due to progression of disease. On DAPT for one year. No LV gram noted.   Seen 2 weeks ago - seemed to be doing ok but wife did not feel he was doing that good. He was "all stopped up". Not using CPAP. BP up. Overall did seem to be improving. We updated his echo. Added CCB.   Comes back today. Here with is wife. Doing ok. Actually doing much better. Still with a "stopped up head". Has had past nasal surgery and has a plate in his nose. Back wearing his CPAP. BP has come down. No chest pain unless he bends over. Nothing like what took him to the hospital last month. Feels ok on his medicines. Inquiring about cardiac rehab. Wife feels like he has improved.    Current Outpatient Prescriptions  Medication Sig Dispense Refill  . amLODipine (NORVASC) 5 MG tablet Take 1 tablet (5 mg total) by mouth daily.  180 tablet  3  . aspirin EC 81 MG tablet Take 1 tablet (81 mg total) by mouth daily.  30 tablet  0  . atorvastatin (LIPITOR) 80 MG tablet Take 80 mg by mouth every other day.       . clopidogrel (PLAVIX) 75 MG tablet Take 1 tablet (75 mg total) by mouth daily with breakfast.  30 tablet  11  . etodolac (LODINE) 400 MG tablet Take 400 mg by mouth 3 (three) times daily as needed (for knee pain).      . fexofenadine (ALLEGRA) 180 MG tablet Take 180 mg by mouth daily.      . fluticasone (FLONASE) 50 MCG/ACT nasal spray Place 2 sprays into the nose daily as needed for rhinitis.      Marland Kitchen LORazepam (ATIVAN) 1 MG tablet Take 1 mg by mouth at bedtime as needed for sleep. For sleep       . nitroGLYCERIN (NITROSTAT) 0.4 MG SL tablet Place 0.4 mg under the tongue every 5 (five) minutes as needed for chest pain.      . pantoprazole (PROTONIX) 40 MG tablet Take 40 mg by mouth 2 (two) times daily. Gi      . PRESCRIPTION MEDICATION Place 1 drop into both eyes 3 (three) times daily. Eye drops for burry vision      . valsartan (DIOVAN) 40 MG tablet Take 20-40 mg by mouth daily. 40mg  in the am and 20mg  in the pm       No current facility-administered medications for this visit.    No Known Allergies  Past Medical History  Diagnosis Date  . Hypertension   . Cancer 1998    throat  . GERD (gastroesophageal reflux disease)   . Arthritis   . Shortness of breath   . Sleep apnea     does not use machine as directed  . CAD (coronary artery disease), native coronary artery      PTCA distal CFX 2008 Dr. Einar Gip; non-obstructive by cath July 2014; s/p DES to the proximal LAD 09/10/2013  . Hyperlipemia    .  Bradycardia, drug induced 11/04/2011  . H/O laryngeal cancer, treated with radiation therapy 11/04/2011  . Aortic root dilatation, history of in 2008      Past Surgical History  Procedure Laterality Date  . Cardiac catheterization    . Patella fracture surgery    . Colonoscopy w/ biopsies and polypectomy      History  Smoking status  . Former Smoker  . Quit date: 08/03/1997  Smokeless tobacco  . Not on file    History  Alcohol Use No    Family History  Problem Relation Age of Onset  . Cancer Brother     Review of Systems: The review of systems is per the HPI.  All other systems were reviewed and are negative.  Physical Exam: BP 140/70  Pulse 59  Ht 5\' 8"  (1.727 m)  Wt 218 lb (98.884 kg)  BMI 33.15 kg/m2 Patient is very pleasant and in no acute distress. Weight is down a few pounds. Skin is warm and dry. Color is normal.  HEENT is unremarkable. Normocephalic/atraumatic. PERRL. Sclera are nonicteric. Neck is supple. No masses. No JVD. Lungs are clear. Cardiac  exam shows a regular rate and rhythm. Abdomen is soft. Extremities are without edema. Gait and ROM are intact. No gross neurologic deficits noted.  Wt Readings from Last 3 Encounters:  10/13/13 218 lb (98.884 kg)  09/25/13 221 lb 6.4 oz (100.426 kg)  09/12/13 210 lb 15.7 oz (95.7 kg)     LABORATORY DATA: Lab Results  Component Value Date   WBC 4.7 09/25/2013   HGB 14.5 09/25/2013   HCT 43.3 09/25/2013   PLT 285.0 09/25/2013   GLUCOSE 119* 09/25/2013   CHOL 172 04/18/2013   TRIG 108 04/18/2013   HDL 41 04/18/2013   LDLCALC 109* 04/18/2013   ALT 10 09/11/2013   AST 14 09/11/2013   NA 137 09/25/2013   K 3.8 09/25/2013   CL 103 09/25/2013   CREATININE 1.0 09/25/2013   BUN 18 09/25/2013   CO2 28 09/25/2013   TSH 1.999 09/11/2013   INR 0.98 09/11/2013   HGBA1C 6.0* 11/05/2011   Procedural Findings:  Hemodynamics:  AO 107/52  Coronary angiography:  Coronary dominance: right  Left mainstem: Short, no significant disease.  Left anterior descending (LAD): There was a 90% proximal LAD stenosis at the takeoff of a small 1st diagonal. This is progressive. This was closely followed by a moderate 2nd diagonal without significant disease. 30-40% stenosis after D2.  Left circumflex (LCx): Large branching PLOM with 60% ostial stenosis.  Right coronary artery (RCA): 60-70% mid RCA stenosis (no change).  Left ventriculography: Not done with radial artery spasm.  Final Conclusions: Progressive proximal LAD stenosis, now 90%. I suspect this is the culprit for his symptoms. Dr. Angelena Smith to perform PCI.  Loralie Champagne  09/11/2013, 9:51 AM   PCI Procedure Details:  The risks, benefits, complications, treatment options, and expected outcomes were discussed with the patient before the diagnostic procedure. He was found to have severe stenosis in the proximal LAD. Informed consent for PCI was on the chart. When I entered the case, there was a 5/6 [place] sheath present in the right radial artery. I  attempted to engage the left main with a guiding catheter but the patient had severe tortuosity in his right subclavian artery making a large loop. I could not engage the left main. He also had spasm in his right radial/brachial artery following the diagnostic cath with intense pain in his right arm. I proceeded  to gain access in the right femoral artery. The right groin was prepped and draped in a sterile fashion. 1% lidocaine was used for local anesthesia. A 6 French sheath was placed in the right femoral artery. I then engaged the left main with a XB LAD 3.5 guiding catheter. A weight based bolus of Angiomax was given. When the ACT was over 200, I passed a Cougar IC wire down the LAD. A 2.5 x 15 mm balloon was used to pre-dilate the severe stenosis in the proximal to mid LAD. A 3.0 x 18 mm Xience Alpine DES was deployed in the proximal LAD. The stent was post-dilated with a 3.25 x 15 mm [place] balloon. The stenosis was taken from 90% down to 0%. There were no immediate complications. The patient was taken to the recovery area in stable condition. He had been loaded with Plavix 600 mg po x 1 at the end of the diagnostic case.  Hemodynamic Findings:  Central aortic pressure: 128/65  Impression:  1. Unstable angina secondary to severe stenosis proximal LAD  2. Successful PTCA/DES x 1 proximal LAD  Recommendations: He will need dual anti-platelet therapy with ASA and Plavix for one year. [place] home tomorrow am if stable.  Complications: None; patient tolerated the procedure well.   Echo Study Conclusions  - Left ventricle: The cavity size was mildly dilated. The estimated ejection fraction was 55%. Wall motion was normal; there were no regional wall motion abnormalities. There was an increased relative contribution of atrial contraction to ventricular filling. Doppler parameters are consistent with abnormal left ventricular relaxation (grade 1 diastolic dysfunction). - Aortic valve: Mild  regurgitation. - Mitral valve: Mild regurgitation. Valve area by pressure half-time: 1.75cm^2.    Assessment / Plan:   1. Chest pain/unstable angina - s/p DES to the proximal LAD - echo shows a normal EF - he is doing well clinically. Ok to go to cardiac rehab if interested. CV risk factor modification encouraged.   2. HTN - BP much better. I have left him on his current regimen.  3. HLD   4. OSA - he is agreeable to trying his CPAP   5. Noted enlarged aortic root - not noted on the echo  6. OA - on Lodine - now on Plavix with his aspirin as well. On PPI. He knows to use this sparingly.   See him back in 4 months.   Patient is agreeable to this plan and will call if any problems develop in the interim.   Burtis Junes, RN, [place]  [address] [place]  [place], [place] [address]

## 2013-10-20 ENCOUNTER — Telehealth: Payer: Self-pay | Admitting: *Deleted

## 2013-10-20 DIAGNOSIS — Z955 Presence of coronary angioplasty implant and graft: Secondary | ICD-10-CM

## 2013-10-20 DIAGNOSIS — I2510 Atherosclerotic heart disease of native coronary artery without angina pectoris: Secondary | ICD-10-CM

## 2013-10-20 NOTE — Telephone Encounter (Signed)
Patient's wife is requesting a [facility]av[facility]. She stated that he also needs a letter explaining why he needs to take these meds. Please include the last 4 of his ss# and that is [phone], and fax to [phone]. Thanks, MI

## 2013-10-21 MED ORDER — AMLODIPINE BESYLATE 5 MG PO TABS: 5.0000 mg | ORAL_TABLET | Freq: Every day | ORAL | Status: DC

## 2013-10-21 MED ORDER — CLOPIDOGREL BISULFATE 75 MG PO TABS: 75.0000 mg | ORAL_TABLET | Freq: Every day | ORAL | Status: DC

## 2013-10-21 NOTE — Telephone Encounter (Signed)
Spoke with pt's wife and told her I would send information. Last 2 office visit notes and prescriptions for 90 day supply of Clopidogrel and Amlodipine faxed to number below.

## 2013-10-26 ENCOUNTER — Telehealth: Payer: Self-pay | Admitting: Cardiovascular Disease

## 2013-10-26 DIAGNOSIS — Z955 Presence of coronary angioplasty implant and graft: Secondary | ICD-10-CM

## 2013-10-26 DIAGNOSIS — I2510 Atherosclerotic heart disease of native coronary artery without angina pectoris: Secondary | ICD-10-CM

## 2013-10-26 MED ORDER — AMLODIPINE BESYLATE 5 MG PO TABS: 5.0000 mg | ORAL_TABLET | Freq: Every day | ORAL | Status: DC

## 2013-10-26 NOTE — Telephone Encounter (Signed)
Spoke with pt's wife who reports [facility] except prescription for amlodipine. Will refax today to number in previous telephone note.

## 2013-10-26 NOTE — Telephone Encounter (Signed)
New message          Pt said the [place] did not receive prescriptions that was sent on 10/20/13. Explanation are in notes.

## 2013-10-26 NOTE — Telephone Encounter (Signed)
New message         [facility] not [facility] that were [facility]. Phone notes in epic.

## 2014-02-12 ENCOUNTER — Encounter: Payer: Self-pay | Admitting: Cardiovascular Disease

## 2014-02-12 ENCOUNTER — Ambulatory Visit (INDEPENDENT_AMBULATORY_CARE_PROVIDER_SITE_OTHER): Payer: Medicare Other | Admitting: Cardiovascular Disease

## 2014-02-12 VITALS — BP 140/60 | HR 66 | Ht 68.0 in | Wt 213.0 lb

## 2014-02-12 DIAGNOSIS — I10 Essential (primary) hypertension: Secondary | ICD-10-CM

## 2014-02-12 DIAGNOSIS — E785 Hyperlipidemia, unspecified: Secondary | ICD-10-CM

## 2014-02-12 DIAGNOSIS — I2510 Atherosclerotic heart disease of native coronary artery without angina pectoris: Secondary | ICD-10-CM

## 2014-02-12 NOTE — Patient Instructions (Signed)
Your physician wants you to follow-up in:  6 months. You will receive a reminder letter in the mail two months in advance. If you don't receive a letter, please call our office to schedule the follow-up appointment.   

## 2014-02-12 NOTE — Progress Notes (Signed)
History of Present Illness: 73 yo male with history of CAD, HTN, HLD and GERD here today for cardiac follow up. Admitted to [facility] 09/10/13 with unstable angina. Found to have severe proximal LAD stenosis treated with a 3.0 x 18 mm Xience DES. Also has 60% mid RCA stenosis, Echo 10/12/13 with normal LV function, mild AI, mild MR.   He is here today for follow up. He was told by a [place] cardiologist that he needed bypass surgery because of moderate disease in RCA and Circumflex. He has had no chest pain or SOB. He is walking. Taking all meds.   Primary Care Physician: Jerene Bears [facility]  Last Lipid Profile:Lipid Panel     Component Value Date/Time   CHOL 172 04/18/2013 0500   TRIG 108 04/18/2013 0500   HDL 41 04/18/2013 0500   CHOLHDL 4.2 04/18/2013 0500   VLDL 22 04/18/2013 0500   LDLCALC 109* 04/18/2013 0500     Past Medical History  Diagnosis Date  . Hypertension   . Cancer 1998    throat  . GERD (gastroesophageal reflux disease)   . Arthritis   . Shortness of breath   . Sleep apnea     does not use machine as directed  . CAD (coronary artery disease), native coronary artery      PTCA distal CFX 2008 Dr. Einar Gip; non-obstructive by cath July 2014; s/p DES to the proximal LAD 09/10/2013  . Hyperlipemia    . Bradycardia, drug induced 11/04/2011  . H/O laryngeal cancer, treated with radiation therapy 11/04/2011  . Aortic root dilatation, history of in 2008      Past Surgical History  Procedure Laterality Date  . Cardiac catheterization    . Patella fracture surgery    . Colonoscopy w/ biopsies and polypectomy      Current Outpatient Prescriptions  Medication Sig Dispense Refill  . amLODipine (NORVASC) 5 MG tablet Take 1 tablet (5 mg total) by mouth daily.  90 tablet  3  . aspirin EC 81 MG tablet Take 1 tablet (81 mg total) by mouth daily.  30 tablet  0  . atorvastatin (LIPITOR) 80 MG tablet Take 80 mg by mouth every other day.       . cetirizine (ZYRTEC) 10 MG tablet Take 10  mg by mouth daily.      . clopidogrel (PLAVIX) 75 MG tablet Take 1 tablet (75 mg total) by mouth daily with breakfast.  90 tablet  3  . etodolac (LODINE) 400 MG tablet Take 400 mg by mouth 3 (three) times daily as needed (for knee pain).      . fluticasone (FLONASE) 50 MCG/ACT nasal spray Place 2 sprays into the nose daily as needed for rhinitis.      Marland Kitchen LORazepam (ATIVAN) 1 MG tablet Take 1 mg by mouth at bedtime as needed for sleep. For sleep      . nitroGLYCERIN (NITROSTAT) 0.4 MG SL tablet Place 0.4 mg under the tongue every 5 (five) minutes as needed for chest pain.      . pantoprazole (PROTONIX) 40 MG tablet Take 40 mg by mouth 2 (two) times daily. Gi      . PRESCRIPTION MEDICATION Place 1 drop into both eyes 3 (three) times daily. Eye drops for burry vision      . valsartan (DIOVAN) 40 MG tablet Take 20-40 mg by mouth daily. 40mg  in the am and 20mg  in the pm       No current facility-administered  medications for this visit.    No Known Allergies  History   Social History  . Marital Status: Married    Spouse Name: N/A    Number of Children: N/A  . Years of Education: N/A   Occupational History  . Retired    Social History Main Topics  . Smoking status: Former Smoker    Quit date: 08/03/1997  . Smokeless tobacco: Not on file  . Alcohol Use: No  . Drug Use: No  . Sexual Activity: Yes    Birth Control/ Protection: None   Other Topics Concern  . Not on file   Social History Narrative   Lives in apartment with wife.    Family History  Problem Relation Age of Onset  . Cancer Brother     Review of Systems:  As stated in the HPI and otherwise negative.   BP 140/60  Pulse 66  Ht 5\' 8"  (1.727 m)  Wt 213 lb (96.616 kg)  BMI 32.39 kg/m2  Physical Examination: General: Well developed, well nourished, NAD HEENT: OP clear, mucus membranes moist SKIN: warm, dry. No rashes. Neuro: No focal deficits Musculoskeletal: Muscle strength 5/5 all ext Psychiatric: Mood and  affect normal Neck: No JVD, no carotid bruits, no thyromegaly, no lymphadenopathy. Lungs:Clear bilaterally, no wheezes, rhonci, crackles Cardiovascular: Regular rate and rhythm. No murmurs, gallops or rubs. Abdomen:Soft. Bowel sounds present. Non-tender.  Extremities: No lower extremity edema. Pulses are 2 + in the bilateral DP/PT.  Cardiac cath 09/11/13: Left mainstem: Short, no significant disease.  Left anterior descending (LAD): There was a 90% proximal LAD stenosis at the takeoff of a small 1st diagonal. This is progressive. This was closely followed by a moderate 2nd diagonal without significant disease. 30-40% stenosis after D2.  Left circumflex (LCx): Large branching PLOM with 60% ostial stenosis.  Right coronary artery (RCA): 60-70% mid RCA stenosis (no change).  Left ventriculography: Not done with radial artery spasm.   Echo 10/12/13: Left ventricle: The cavity size was mildly dilated. The estimated ejection fraction was 55%. Wall motion was normal; there were no regional wall motion abnormalities. There was an increased relative contribution of atrial contraction to ventricular filling. Doppler parameters are consistent with abnormal left ventricular relaxation (grade 1 diastolic dysfunction). - Aortic valve: Mild regurgitation. - Mitral valve: Mild regurgitation. Valve area by pressure half-time: 1.75cm^2.  Assessment and Plan:   1. CAD: Stable. Continue ASA and Plavix. Medical management of moderate RCA and Circumflex disease. I have spent additional time today reassuring he and his wife that medical management of his moderate is appropriate and guideline based. We do not typically place stents in moderate non flow limiting lesions and do not typically perform of moderately diseased vessels. We cannot predict how fast his CAD will progress but we will address any changes in his CAD at that time and treat it appropriately.   2. HTN: BP controlled. No changes.   3. HLD:  continue statin.

## 2014-03-13 ENCOUNTER — Telehealth: Payer: Self-pay | Admitting: Physician Assistant

## 2014-03-13 ENCOUNTER — Emergency Department (HOSPITAL_COMMUNITY): Payer: Non-veteran care

## 2014-03-13 ENCOUNTER — Emergency Department (HOSPITAL_COMMUNITY)
Admission: EM | Admit: 2014-03-13 | Discharge: 2014-03-13 | Disposition: A | Discharge status: Home / Self Care | Payer: Non-veteran care | Attending: Emergency Medicine | Admitting: Emergency Medicine

## 2014-03-13 ENCOUNTER — Encounter (HOSPITAL_COMMUNITY): Payer: Self-pay | Admitting: Emergency Medicine

## 2014-03-13 DIAGNOSIS — K2990 Gastroduodenitis, unspecified, without bleeding: Principal | ICD-10-CM

## 2014-03-13 DIAGNOSIS — E785 Hyperlipidemia, unspecified: Secondary | ICD-10-CM | POA: Insufficient documentation

## 2014-03-13 DIAGNOSIS — I2510 Atherosclerotic heart disease of native coronary artery without angina pectoris: Secondary | ICD-10-CM | POA: Insufficient documentation

## 2014-03-13 DIAGNOSIS — Z9889 Other specified postprocedural states: Secondary | ICD-10-CM | POA: Insufficient documentation

## 2014-03-13 DIAGNOSIS — Z85819 Personal history of malignant neoplasm of unspecified site of lip, oral cavity, and pharynx: Secondary | ICD-10-CM | POA: Insufficient documentation

## 2014-03-13 DIAGNOSIS — K2970 Gastritis, unspecified, without bleeding: Secondary | ICD-10-CM

## 2014-03-13 DIAGNOSIS — Z87891 Personal history of nicotine dependence: Secondary | ICD-10-CM | POA: Insufficient documentation

## 2014-03-13 DIAGNOSIS — IMO0002 Reserved for concepts with insufficient information to code with codable children: Secondary | ICD-10-CM | POA: Insufficient documentation

## 2014-03-13 DIAGNOSIS — Z923 Personal history of irradiation: Secondary | ICD-10-CM | POA: Insufficient documentation

## 2014-03-13 DIAGNOSIS — Z8521 Personal history of malignant neoplasm of larynx: Secondary | ICD-10-CM | POA: Insufficient documentation

## 2014-03-13 DIAGNOSIS — I10 Essential (primary) hypertension: Secondary | ICD-10-CM | POA: Insufficient documentation

## 2014-03-13 DIAGNOSIS — Z7902 Long term (current) use of antithrombotics/antiplatelets: Secondary | ICD-10-CM | POA: Insufficient documentation

## 2014-03-13 DIAGNOSIS — K219 Gastro-esophageal reflux disease without esophagitis: Secondary | ICD-10-CM | POA: Insufficient documentation

## 2014-03-13 DIAGNOSIS — Z79899 Other long term (current) drug therapy: Secondary | ICD-10-CM | POA: Insufficient documentation

## 2014-03-13 DIAGNOSIS — M129 Arthropathy, unspecified: Secondary | ICD-10-CM | POA: Insufficient documentation

## 2014-03-13 LAB — PROTIME-INR
INR: 1.02 (ref 0.00–1.49)
Prothrombin Time: 13.2 seconds (ref 11.6–15.2)

## 2014-03-13 LAB — LIPASE, BLOOD: LIPASE: 17 U/L (ref 11–59)

## 2014-03-13 LAB — CBC
HCT: 40.2 % (ref 39.0–52.0)
HEMOGLOBIN: 13.7 g/dL (ref 13.0–17.0)
MCH: 29.4 pg (ref 26.0–34.0)
MCHC: 34.1 g/dL (ref 30.0–36.0)
MCV: 86.3 fL (ref 78.0–100.0)
Platelets: 236 10*3/uL (ref 150–400)
RBC: 4.66 MIL/uL (ref 4.22–5.81)
RDW: 14.1 % (ref 11.5–15.5)
WBC: 7.3 10*3/uL (ref 4.0–10.5)

## 2014-03-13 LAB — TROPONIN I: Troponin I: <0.3 ng/mL (ref <0.30)

## 2014-03-13 LAB — BASIC METABOLIC PANEL
BUN: 13 mg/dL (ref 6–23)
CHLORIDE: 101 meq/L (ref 96–112)
CO2: 26 mEq/L (ref 19–32)
Calcium: 9.7 mg/dL (ref 8.4–10.5)
Creatinine, Ser: 0.89 mg/dL (ref 0.50–1.35)
GFR calc Af Amer: >90 mL/min (ref >90)
GFR, EST NON AFRICAN AMERICAN: 82 mL/min — AB (ref >90)
GLUCOSE: 98 mg/dL (ref 70–99)
POTASSIUM: 4.2 meq/L (ref 3.7–5.3)
SODIUM: 138 meq/L (ref 137–147)

## 2014-03-13 LAB — HEPATIC FUNCTION PANEL
ALT: 15 U/L (ref 0–53)
AST: 20 U/L (ref 0–37)
Albumin: 3.8 g/dL (ref 3.5–5.2)
Alkaline Phosphatase: 91 U/L (ref 39–117)
BILIRUBIN TOTAL: 1 mg/dL (ref 0.3–1.2)
Total Protein: 6.8 g/dL (ref 6.0–8.3)

## 2014-03-13 LAB — I-STAT TROPONIN, ED: TROPONIN I, POC: 0.01 ng/mL (ref 0.00–0.08)

## 2014-03-13 LAB — PRO B NATRIURETIC PEPTIDE: Pro B Natriuretic peptide (BNP): 145.5 pg/mL — ABNORMAL HIGH (ref 0–125)

## 2014-03-13 MED ORDER — SUCRALFATE 1 GM/10ML PO SUSP: 1.0000 g | Freq: Three times a day (TID) | ORAL | Status: DC

## 2014-03-13 MED ORDER — GI COCKTAIL ~~LOC~~
30.0000 mL | Freq: Once | ORAL | Status: AC
  Administered 2014-03-13: 30 mL via ORAL
  Filled 2014-03-13: qty 30

## 2014-03-13 NOTE — Telephone Encounter (Signed)
[name] is a 73 y.o. male with a hx of CAD, s/p prior PCI with most recent DES to LAD in 08/2013.   He started having chest pain last night. It awoke him from sleep.  He took NTG x 1 last night. CP returned (epigastric to substernal chest) about 1-2 hours ago.  No more NTG taken. BP 160/78. + SOB No nausea + diaphoretic + radiating to his neck I have advised his wife to give him NTG x 1 now and call 911 to have him take to the [facility] ED. She agrees with this plan. Richardson Dopp, PA-C   03/13/2014 3:53 PM

## 2014-03-13 NOTE — ED Provider Notes (Signed)
CSN: [id number]     Arrival date & time 03/13/14  1707 History   First MD Initiated Contact with Patient 03/13/14 1713     Chief Complaint  Patient presents with  . Abdominal Pain     (Consider location/radiation/quality/duration/timing/severity/associated sxs/prior Treatment) Patient is a 73 y.o. male presenting with abdominal pain. The history is provided by the patient.  Abdominal Pain Pain location:  Epigastric, LUQ and RUQ Pain quality: burning, cramping and gnawing   Pain radiation: substernal. Pain severity:  Moderate Onset quality:  Gradual Duration:  2 days Timing:  Intermittent Progression:  Waxing and waning Chronicity:  New Context comment:  Can't recall anything that makes it worse Relieved by: ntg. Exacerbated by: worse after taking milk of magnesium. Ineffective treatments: laxatives. Associated symptoms: cough   Associated symptoms: no anorexia, no chest pain, no chills, no diarrhea, no fatigue, no fever, no nausea and no vomiting   Risk factors: aspirin and being elderly   Risk factors: no alcohol abuse     Past Medical History  Diagnosis Date  . Hypertension   . Cancer 1998    throat  . GERD (gastroesophageal reflux disease)   . Arthritis   . Shortness of breath   . Sleep apnea     does not use machine as directed  . CAD (coronary artery disease), native coronary artery      PTCA distal CFX 2008 Dr. Einar Gip; non-obstructive by cath July 2014; s/p DES to the proximal LAD 09/10/2013  . Hyperlipemia    . Bradycardia, drug induced 11/04/2011  . H/O laryngeal cancer, treated with radiation therapy 11/04/2011  . Aortic root dilatation, history of in 2008     Past Surgical History  Procedure Laterality Date  . Cardiac catheterization    . Patella fracture surgery    . Colonoscopy w/ biopsies and polypectomy     Family History  Problem Relation Age of Onset  . Cancer Brother    History  Substance Use Topics  . Smoking status: Former Smoker    Quit  date: 08/03/1997  . Smokeless tobacco: Not on file  . Alcohol Use: No    Review of Systems  Constitutional: Negative for fever, chills, diaphoresis and fatigue.  Respiratory: Positive for cough.   Cardiovascular: Negative for chest pain.  Gastrointestinal: Positive for abdominal pain. Negative for nausea, vomiting, diarrhea and anorexia.  All other systems reviewed and are negative.     Allergies  Review of patient's allergies indicates no known allergies.  Home Medications   Prior to Admission medications   Medication Sig Start Date End Date Taking? Authorizing Provider  amLODipine (NORVASC) 5 MG tablet Take 1 tablet (5 mg total) by mouth daily. 10/26/13  Yes Burtis Junes, NP  aspirin EC 81 MG tablet Take 1 tablet (81 mg total) by mouth daily. 04/18/13  Yes Brooke O Edmisten, PA-C  atorvastatin (LIPITOR) 80 MG tablet Take 80 mg by mouth every other day.    Yes Historical Provider, MD  cetirizine (ZYRTEC) 10 MG tablet Take 10 mg by mouth daily.   Yes Historical Provider, MD  clopidogrel (PLAVIX) 75 MG tablet Take 1 tablet (75 mg total) by mouth daily with breakfast. 10/21/13  Yes Burtis Junes, NP  docusate sodium (COLACE) 100 MG capsule Take 100 mg by mouth 2 (two) times daily.   Yes Historical Provider, MD  fluticasone (FLONASE) 50 MCG/ACT nasal spray Place 2 sprays into the nose daily as needed for rhinitis.   Yes Historical Provider,  MD  LORazepam (ATIVAN) 1 MG tablet Take 1 mg by mouth at bedtime as needed for sleep. For sleep   Yes Historical Provider, MD  magnesium hydroxide (MILK OF MAGNESIA) 400 MG/5ML suspension Take 15 mLs by mouth daily as needed for mild constipation or moderate constipation.   Yes Historical Provider, MD  nitroGLYCERIN (NITROSTAT) 0.4 MG SL tablet Place 0.4 mg under the tongue every 5 (five) minutes as needed for chest pain.   Yes Historical Provider, MD  pantoprazole (PROTONIX) 40 MG tablet Take 40 mg by mouth 2 (two) times daily.    Yes Historical  Provider, MD  PRESCRIPTION MEDICATION Place 1 drop into both eyes 3 (three) times daily. Eye drops for burry vision   Yes Historical Provider, MD  valsartan (DIOVAN) 40 MG tablet Take 20-40 mg by mouth daily. 40mg  in the am and 20mg  in the pm   Yes Historical Provider, MD   There were no vitals taken for this visit. Physical Exam  Nursing note and vitals reviewed. Constitutional: He is oriented to person, place, and time. He appears well-developed and well-nourished. No distress.  HENT:  Head: Normocephalic and atraumatic.  Right Ear: External ear normal.  Left Ear: External ear normal.  Mouth/Throat: Oropharynx is clear and moist.  Eyes: Conjunctivae and EOM are normal. Pupils are equal, round, and reactive to light. Right eye exhibits no discharge.  Neck: Normal range of motion. Neck supple.  Cardiovascular: Normal rate, regular rhythm, normal heart sounds and intact distal pulses.   No murmur heard. Pulmonary/Chest: Tachypnea noted. No respiratory distress. He has no decreased breath sounds. He has no wheezes. He has no rhonchi. He has no rales.  Abdominal: Soft. There is tenderness in the right upper quadrant, epigastric area and left upper quadrant. There is no rebound and no guarding.  Musculoskeletal: Normal range of motion. He exhibits no edema and no tenderness.  Neurological: He is alert and oriented to person, place, and time.  Skin: Skin is warm and dry. No rash noted.  Psychiatric: He has a normal mood and affect.    ED Course  Procedures (including critical care time) Labs Review Labs Reviewed  BASIC METABOLIC PANEL - Abnormal; Notable for the following:    GFR calc non Af Amer 82 (*)    All other components within normal limits  PRO B NATRIURETIC PEPTIDE - Abnormal; Notable for the following:    Pro B Natriuretic peptide (BNP) 145.5 (*)    All other components within normal limits  CBC  TROPONIN I  PROTIME-INR  LIPASE, BLOOD  HEPATIC FUNCTION PANEL  I-STAT  TROPOININ, ED    Imaging Review Dg Chest Port 1 View  03/13/2014   CLINICAL DATA:  Lower chest pain  EXAM: PORTABLE CHEST - 1 VIEW  COMPARISON:  09/10/2013  FINDINGS: Cardiomediastinal silhouette is stable. Mild elevation of the left hemidiaphragm again noted. No acute infiltrate or pleural effusion. No pulmonary edema.  IMPRESSION: No active disease.   Electronically Signed   By: Lahoma Crocker M.D.   On: 03/13/2014 17:55     EKG Interpretation   Date/Time:  Saturday March 13 2014 17:24:04 EDT Ventricular Rate:  65 PR Interval:  205 QRS Duration: 109 QT Interval:  429 QTC Calculation: 446 R Axis:   -41 Text Interpretation:  Sinus rhythm Incomplete RBBB and LAFB Low voltage,  precordial leads RSR' in V1 or V2, right VCD or RVH No significant change  since last tracing Confirmed by [facility]  MD, Joycelynn Fritsche ([id number]) on  03/13/2014  5:58:27 PM      MDM   Final diagnoses:  Gastritis    Patient here complaining of upper abdominal pain, epigastric pain that has been intermittent for the last few days but worse today. Denies any shortness of breath, nausea or vomiting but has noticed a burning sensation in his throat in the morning.  Patient has taken 2 nitroglycerin separately with improvement in the pain.  On exam he has upper abdominal tenderness but is in no acute distress. Vital signs are stable EKG is unchanged. Troponin, CBC, BMP, lipase are all within normal limits.  Right upper quadrant ultrasound pending. Patient given GI cocktail. Lower suspicion for cardiac pathology given patient's symptoms feel most likely GI in nature  9:10 PM Ultrasound within normal limits. Labs are all unremarkable. Patient's symptoms significantly improved after a GI cocktail and feel most likely that is the cause of his symptoms. He states it did start after he ate large meals with heavy foods that he's not used too. After having pain for greater than 12 hours in coming here and having a normal troponin low  suspicion for cardiac etiology at this time. Patient's Protonix was increased and he was started on Carafate. He will followup with his PCP next week if symptoms are not improving and make an appointment to see his GI doctor if they persist.  Blanchie Dessert, MD 03/13/14 2111

## 2014-03-13 NOTE — Discharge Instructions (Signed)
Increase your protonix to 2 pills 2 times a day for the next week Gastritis, Adult Gastritis is soreness and swelling (inflammation) of the lining of the stomach. Gastritis can develop as a sudden onset (acute) or long-term (chronic) condition. If gastritis is not treated, it can lead to stomach bleeding and ulcers. CAUSES  Gastritis occurs when the stomach lining is weak or damaged. Digestive juices from the stomach then inflame the weakened stomach lining. The stomach lining may be weak or damaged due to viral or bacterial infections. One common bacterial infection is the Helicobacter pylori infection. Gastritis can also result from excessive alcohol consumption, taking certain medicines, or having too much acid in the stomach.  SYMPTOMS  In some cases, there are no symptoms. When symptoms are present, they may include:  Pain or a burning sensation in the upper abdomen.  Nausea.  Vomiting.  An uncomfortable feeling of fullness after eating. DIAGNOSIS  Your caregiver may suspect you have gastritis based on your symptoms and a physical exam. To determine the cause of your gastritis, your caregiver may perform the following:  Blood or stool tests to check for the H pylori bacterium.  Gastroscopy. A thin, flexible tube (endoscope) is passed down the esophagus and into the stomach. The endoscope has a light and camera on the end. Your caregiver uses the endoscope to view the inside of the stomach.  Taking a tissue sample (biopsy) from the stomach to examine under a microscope. TREATMENT  Depending on the cause of your gastritis, medicines may be prescribed. If you have a bacterial infection, such as an H pylori infection, antibiotics may be given. If your gastritis is caused by too much acid in the stomach, H2 blockers or antacids may be given. Your caregiver may recommend that you stop taking aspirin, ibuprofen, or other nonsteroidal anti-inflammatory drugs (NSAIDs). HOME CARE  INSTRUCTIONS  Only take over-the-counter or prescription medicines as directed by your caregiver.  If you were given antibiotic medicines, take them as directed. Finish them even if you start to feel better.  Drink enough fluids to keep your urine clear or pale yellow.  Avoid foods and drinks that make your symptoms worse, such as:  Caffeine or alcoholic drinks.  Chocolate.  Peppermint or mint flavorings.  Garlic and onions.  Spicy foods.  Citrus fruits, such as oranges, lemons, or limes.  Tomato-based foods such as sauce, chili, salsa, and pizza.  Fried and fatty foods.  Eat small, frequent meals instead of large meals. SEEK IMMEDIATE MEDICAL CARE IF:   You have black or dark red stools.  You vomit blood or material that looks like coffee grounds.  You are unable to keep fluids down.  Your abdominal pain gets worse.  You have a fever.  You do not feel better after 1 week.  You have any other questions or concerns. MAKE SURE YOU:  Understand these instructions.  Will watch your condition.  Will get help right away if you are not doing well or get worse. Document Released: 09/11/2001 Document Revised: 03/18/2012 Document Reviewed: 10/31/2011 [facility] Patient Information 2014 [place].

## 2014-03-13 NOTE — ED Notes (Signed)
Woke up feeling constipated this a.m. With epigastric pain, has had BM today that relieved pain, but epigastric pain has returned.

## 2014-03-13 NOTE — ED Notes (Signed)
Received 324mg  Aspirin po via EMS

## 2014-03-15 NOTE — Telephone Encounter (Signed)
Agree. cdm 

## 2014-04-29 ENCOUNTER — Telehealth: Payer: Self-pay | Admitting: Family Medicine

## 2014-04-29 ENCOUNTER — Inpatient Hospital Stay (HOSPITAL_COMMUNITY)
Admission: RE | Admit: 2014-04-29 | Discharge: 2014-05-05 | DRG: 417 | Disposition: A | Discharge status: Home / Self Care | Payer: Medicare Other | Source: Other Acute Inpatient Hospital | Attending: Internal Medicine | Admitting: Internal Medicine

## 2014-04-29 DIAGNOSIS — Z01810 Encounter for preprocedural cardiovascular examination: Secondary | ICD-10-CM | POA: Diagnosis not present

## 2014-04-29 DIAGNOSIS — K560 Paralytic ileus: Secondary | ICD-10-CM | POA: Diagnosis present

## 2014-04-29 DIAGNOSIS — K219 Gastro-esophageal reflux disease without esophagitis: Secondary | ICD-10-CM | POA: Diagnosis present

## 2014-04-29 DIAGNOSIS — G4733 Obstructive sleep apnea (adult) (pediatric): Secondary | ICD-10-CM | POA: Diagnosis present

## 2014-04-29 DIAGNOSIS — K8064 Calculus of gallbladder and bile duct with chronic cholecystitis without obstruction: Principal | ICD-10-CM | POA: Diagnosis present

## 2014-04-29 DIAGNOSIS — R339 Retention of urine, unspecified: Secondary | ICD-10-CM | POA: Diagnosis not present

## 2014-04-29 DIAGNOSIS — K838 Other specified diseases of biliary tract: Secondary | ICD-10-CM | POA: Diagnosis not present

## 2014-04-29 DIAGNOSIS — K8060 Calculus of gallbladder and bile duct with cholecystitis, unspecified, without obstruction: Principal | ICD-10-CM | POA: Diagnosis present

## 2014-04-29 DIAGNOSIS — Z923 Personal history of irradiation: Secondary | ICD-10-CM

## 2014-04-29 DIAGNOSIS — E785 Hyperlipidemia, unspecified: Secondary | ICD-10-CM | POA: Diagnosis present

## 2014-04-29 DIAGNOSIS — Z79899 Other long term (current) drug therapy: Secondary | ICD-10-CM | POA: Diagnosis not present

## 2014-04-29 DIAGNOSIS — Z9861 Coronary angioplasty status: Secondary | ICD-10-CM

## 2014-04-29 DIAGNOSIS — G8929 Other chronic pain: Secondary | ICD-10-CM | POA: Diagnosis present

## 2014-04-29 DIAGNOSIS — Z7982 Long term (current) use of aspirin: Secondary | ICD-10-CM | POA: Diagnosis not present

## 2014-04-29 DIAGNOSIS — K859 Acute pancreatitis without necrosis or infection, unspecified: Secondary | ICD-10-CM | POA: Diagnosis present

## 2014-04-29 DIAGNOSIS — I2510 Atherosclerotic heart disease of native coronary artery without angina pectoris: Secondary | ICD-10-CM | POA: Diagnosis present

## 2014-04-29 DIAGNOSIS — M549 Dorsalgia, unspecified: Secondary | ICD-10-CM | POA: Diagnosis present

## 2014-04-29 DIAGNOSIS — I10 Essential (primary) hypertension: Secondary | ICD-10-CM | POA: Diagnosis present

## 2014-04-29 DIAGNOSIS — Z7902 Long term (current) use of antithrombotics/antiplatelets: Secondary | ICD-10-CM | POA: Diagnosis not present

## 2014-04-29 DIAGNOSIS — K851 Biliary acute pancreatitis without necrosis or infection: Secondary | ICD-10-CM

## 2014-04-29 DIAGNOSIS — Z01818 Encounter for other preprocedural examination: Secondary | ICD-10-CM

## 2014-04-29 DIAGNOSIS — Z8521 Personal history of malignant neoplasm of larynx: Secondary | ICD-10-CM

## 2014-04-29 DIAGNOSIS — Z87891 Personal history of nicotine dependence: Secondary | ICD-10-CM | POA: Diagnosis not present

## 2014-04-29 DIAGNOSIS — I25119 Atherosclerotic heart disease of native coronary artery with unspecified angina pectoris: Secondary | ICD-10-CM

## 2014-04-29 DIAGNOSIS — I252 Old myocardial infarction: Secondary | ICD-10-CM

## 2014-04-29 HISTORY — DX: Malignant neoplasm of larynx, unspecified: C32.9

## 2014-04-29 HISTORY — DX: Obstructive sleep apnea (adult) (pediatric): G47.33

## 2014-04-29 HISTORY — DX: Dependence on other enabling machines and devices: Z99.89

## 2014-04-29 HISTORY — DX: Other chronic pain: G89.29

## 2014-04-29 HISTORY — DX: Dorsalgia, unspecified: M54.9

## 2014-04-29 HISTORY — DX: Headache, unspecified: R51.9

## 2014-04-29 HISTORY — DX: Headache: R51

## 2014-04-29 MED ORDER — SODIUM CHLORIDE 0.9 % IV SOLN
1.5000 g | Freq: Four times a day (QID) | INTRAVENOUS | Status: DC
  Filled 2014-04-29 (×2): qty 1.5

## 2014-04-29 MED ORDER — SODIUM CHLORIDE 0.9 % IV SOLN
INTRAVENOUS | Status: DC
  Administered 2014-04-29 – 2014-04-30 (×3): via INTRAVENOUS

## 2014-04-29 MED ORDER — MORPHINE SULFATE 2 MG/ML IJ SOLN
2.0000 mg | INTRAMUSCULAR | Status: DC | PRN
  Administered 2014-04-30: 2 mg via INTRAVENOUS
  Administered 2014-04-30: 4 mg via INTRAVENOUS
  Administered 2014-04-30 (×3): 2 mg via INTRAVENOUS
  Administered 2014-04-30 – 2014-05-01 (×6): 4 mg via INTRAVENOUS
  Administered 2014-05-02 – 2014-05-03 (×6): 2 mg via INTRAVENOUS
  Filled 2014-04-29: qty 1
  Filled 2014-04-29: qty 2
  Filled 2014-04-29 (×3): qty 1
  Filled 2014-04-29: qty 2
  Filled 2014-04-29 (×2): qty 1
  Filled 2014-04-29: qty 2
  Filled 2014-04-29: qty 1
  Filled 2014-04-29: qty 2
  Filled 2014-04-29 (×2): qty 1
  Filled 2014-04-29: qty 2
  Filled 2014-04-29: qty 1
  Filled 2014-04-29 (×2): qty 2

## 2014-04-29 MED ORDER — HEPARIN SODIUM (PORCINE) 5000 UNIT/ML IJ SOLN
5000.0000 [IU] | Freq: Three times a day (TID) | INTRAMUSCULAR | Status: DC
  Administered 2014-04-29 – 2014-05-02 (×8): 5000 [IU] via SUBCUTANEOUS
  Filled 2014-04-29 (×15): qty 1

## 2014-04-29 MED ORDER — PIPERACILLIN-TAZOBACTAM 3.375 G IVPB
3.3750 g | Freq: Three times a day (TID) | INTRAVENOUS | Status: DC
  Administered 2014-04-29 – 2014-04-30 (×2): 3.375 g via INTRAVENOUS
  Filled 2014-04-29 (×4): qty 50

## 2014-04-29 MED ORDER — VANCOMYCIN HCL IN DEXTROSE 1-5 GM/200ML-% IV SOLN
1000.0000 mg | Freq: Two times a day (BID) | INTRAVENOUS | Status: DC
  Administered 2014-04-29 – 2014-04-30 (×2): 1000 mg via INTRAVENOUS
  Filled 2014-04-29 (×3): qty 200

## 2014-04-29 NOTE — H&P (Addendum)
Hospitalist Admission History and Physical  Patient name: [name] Medical record number: [id number] Date of birth: 1941/06/24 Age: 73 y.o. Gender: male  Primary Care Provider: Glenda Chroman., MD  Chief Complaint: pancreatitis ,CBD dilatation  History of Present Illness:This is a 73 y.o. year old male with significant past medical history of HTN, CAD s/p stenting, laryngeal cancer s/p radiation therapy 2013 presenting with pancreatitis, CBD dilatation. Pt states that he has had -34 months of recurrent abd pain and nausea. States that he has been seen for similar sxs in the ER here in the past. Presented to ER in [place] with worsening sxs. Notable labs include WBC 7.9, hgb 14.9, ALP 176, AST 109, ALT 69. LDH 203, K 3.4, t bili 1.6, Cr 0.96, bicarb 27. Lipase 1400. Amylase 1500. CT abd showed 7.8 mm gb wall thickening, 12.7 cm bile duct dilatation. + intrahepatic biliary dilatation. Was consulted by GI in [place]. ? Planned ERCP. Started on unasyn. Family requested tx to [facility] for further management.  On presentation, tmax 101, otherwise hemodynamically stable, mentating well. Repeat labs pending.   Assessment and Plan: GIANNY Smith is a 73 y.o. year old male presenting with pancretitis, cbd dilatation   Active Problems:   Pancreatitis   Common bile duct dilatation   Pancreatitis/CBD dilatation -consult GI-spoke with Dr. Liz Malady will c/s in am.  -Pain control -preliminary ranson score 1 pending WBC -blood cultures -vanc and zosyn for infectious coverage (f/u on ? Sepsis criteria pending labwork-CBC).  CAD/HTN -no active CP currently  -tele bed  -continue home regimen.    FEN/GI: NPO for now  Prophylaxis: sub q heparin  Disposition: pending further evaluation Code Status:Full Code    Patient Active Problem List   Diagnosis Date Noted  . Pancreatitis 04/29/2014  . Common bile duct dilatation 04/29/2014  . Progressive angina 09/10/2013  . Intermediate coronary  syndrome 09/10/2013  . Angina at rest 11/04/2011  . CAD (coronary artery disease), history of PTCA only of LCX in 2008 and 50% stenosis of RCA at that time and 50% LAD,  11/04/2011  . HTN (hypertension) 11/04/2011  . Hyperlipemia 11/04/2011  . Bradycardia, drug induced 11/04/2011  . H/O laryngeal cancer, treated with radiation therapy 11/04/2011  . Aortic root dilatation, history of in 2008 11/04/2011   Past Medical History: Past Medical History  Diagnosis Date  . Hypertension   . Cancer 1998    throat  . GERD (gastroesophageal reflux disease)   . Arthritis   . Shortness of breath   . Sleep apnea     does not use machine as directed  . CAD (coronary artery disease), native coronary artery      PTCA distal CFX 2008 Dr. Einar Gip; non-obstructive by cath July 2014; s/p DES to the proximal LAD 09/10/2013  . Hyperlipemia    . Bradycardia, drug induced 11/04/2011  . H/O laryngeal cancer, treated with radiation therapy 11/04/2011  . Aortic root dilatation, history of in 2008      Past Surgical History: Past Surgical History  Procedure Laterality Date  . Cardiac catheterization    . Patella fracture surgery    . Colonoscopy w/ biopsies and polypectomy      Social History: History   Social History  . Marital Status: Married    Spouse Name: N/A    Number of Children: N/A  . Years of Education: N/A   Occupational History  . Retired    Social History Main Topics  . Smoking status: Former Smoker  Quit date: 08/03/1997  . Smokeless tobacco: Not on file  . Alcohol Use: No  . Drug Use: No  . Sexual Activity: Yes    Birth Control/ Protection: None   Other Topics Concern  . Not on file   Social History Narrative   Lives in apartment with wife.    Family History: Family History  Problem Relation Age of Onset  . Cancer Brother     Allergies: No Known Allergies  Current Facility-Administered Medications  Medication Dose Route Frequency Provider Last Rate Last Dose  .  0.9 %  sodium chloride infusion   Intravenous Continuous Shanda Howells, MD      . ampicillin-sulbactam (UNASYN) 1.5 g in sodium chloride 0.9 % 50 mL IVPB  1.5 g Intravenous Q6H Shanda Howells, MD      . heparin injection 5,000 Units  5,000 Units Subcutaneous 3 times per day Shanda Howells, MD      . morphine 2 MG/ML injection 2-4 mg  2-4 mg Intravenous Q3H PRN Shanda Howells, MD       Review Of Systems: 12 point ROS negative except as noted above in HPI.  Physical Exam: There were no vitals filed for this visit.  General: alert and cooperative HEENT: PERRLA and extra ocular movement intact Heart: S1, S2 normal, no murmur, rub or gallop, regular rate and rhythm Lungs: clear to auscultation, no wheezes or rales and unlabored breathing Abdomen: abdomen is soft without significant tenderness, masses, organomegaly or guarding Extremities: extremities normal, atraumatic, no cyanosis or edema Skin:no rashes, no ecchymoses Neurology: normal without focal findings  Labs and Imaging: Lab Results  Component Value Date/Time   NA 138 03/13/2014  5:53 PM   K 4.2 03/13/2014  5:53 PM   CL 101 03/13/2014  5:53 PM   CO2 26 03/13/2014  5:53 PM   BUN 13 03/13/2014  5:53 PM   CREATININE 0.89 03/13/2014  5:53 PM   GLUCOSE 98 03/13/2014  5:53 PM   Lab Results  Component Value Date   WBC 7.3 03/13/2014   HGB 13.7 03/13/2014   HCT 40.2 03/13/2014   MCV 86.3 03/13/2014   PLT 236 03/13/2014    No results found.         Shanda Howells MD  Pager: [phone]

## 2014-04-29 NOTE — Telephone Encounter (Signed)
Received call from Dr. Woody Seller at [facility] about pt w/ pancreatitis and CBD dilatation Admitted 1-2 days ago with abd pain  Found to have pancreatitis on CT and blood work (lipase 3k) Also with CBD dilatation @ 1.7 cm Was being seen by GI at [facility]. Dr Woody Seller does report that ERCP/GI is available there, albeit limited.  Family is requesting transfer to [facility]  Pt currently hemodynamically stable.  On unasyn WBC 7.9 Cr 0.96 Accepted to tele bed GI consult upon arrival.

## 2014-04-29 NOTE — Progress Notes (Signed)
Pt arrived to 5W27 from [facility] alert and oriented x4. Oriented to room, unit, and staff.  Bed in lowest position and call bell is within reach. Will continue to monitor.

## 2014-04-29 NOTE — Progress Notes (Signed)
ANTIBIOTIC CONSULT NOTE - INITIAL  Pharmacy Consult for Vancomycin and Zosyn Indication: intra-abd infection  No Known Allergies  Patient Measurements:   Ht: 68 in  Wt:  96 kg  Vital Signs: Temp: 101 F (38.3 C) (07/30 2201) Temp src: Oral (07/30 2201) BP: 138/71 mmHg (07/30 2201) Pulse Rate: 86 (07/30 2201) Intake/Output from previous day:   Intake/Output from this shift:    Labs: No results found for this basename: WBC, HGB, PLT, LABCREA, CREATININE,  in the last 72 hours The CrCl is unknown because both a height and weight (above a minimum accepted value) are required for this calculation. No results found for this basename: VANCOTROUGH, VANCOPEAK, VANCORANDOM, GENTTROUGH, GENTPEAK, GENTRANDOM, TOBRATROUGH, TOBRAPEAK, TOBRARND, AMIKACINPEAK, AMIKACINTROU, AMIKACIN,  in the last 72 hours   Microbiology: No results found for this or any previous visit (from the past 720 hour(s)).  Medical History: Past Medical History  Diagnosis Date  . Hypertension   . Cancer 1998    throat  . GERD (gastroesophageal reflux disease)   . Arthritis   . Shortness of breath   . Sleep apnea     does not use machine as directed  . CAD (coronary artery disease), native coronary artery      PTCA distal CFX 2008 Dr. Einar Gip; non-obstructive by cath July 2014; s/p DES to the proximal LAD 09/10/2013  . Hyperlipemia    . Bradycardia, drug induced 11/04/2011  . H/O laryngeal cancer, treated with radiation therapy 11/04/2011  . Aortic root dilatation, history of in 2008      Medications:  Prescriptions prior to admission  Medication Sig Dispense Refill  . amLODipine (NORVASC) 5 MG tablet Take 1 tablet (5 mg total) by mouth daily.  90 tablet  3  . aspirin EC 81 MG tablet Take 1 tablet (81 mg total) by mouth daily.  30 tablet  0  . atorvastatin (LIPITOR) 80 MG tablet Take 80 mg by mouth every other day.       . cetirizine (ZYRTEC) 10 MG tablet Take 10 mg by mouth daily.      . clopidogrel (PLAVIX)  75 MG tablet Take 1 tablet (75 mg total) by mouth daily with breakfast.  90 tablet  3  . docusate sodium (COLACE) 100 MG capsule Take 100 mg by mouth 2 (two) times daily.      . fluticasone (FLONASE) 50 MCG/ACT nasal spray Place 2 sprays into the nose daily as needed for rhinitis.      Marland Kitchen LORazepam (ATIVAN) 1 MG tablet Take 1 mg by mouth at bedtime as needed for sleep. For sleep      . magnesium hydroxide (MILK OF MAGNESIA) 400 MG/5ML suspension Take 15 mLs by mouth daily as needed for mild constipation or moderate constipation.      . nitroGLYCERIN (NITROSTAT) 0.4 MG SL tablet Place 0.4 mg under the tongue every 5 (five) minutes as needed for chest pain.      . pantoprazole (PROTONIX) 40 MG tablet Take 40 mg by mouth 2 (two) times daily.       Marland Kitchen PRESCRIPTION MEDICATION Place 1 drop into both eyes 3 (three) times daily. Eye drops for burry vision      . sucralfate (CARAFATE) 1 GM/10ML suspension Take 10 mLs (1 g total) by mouth 4 (four) times daily -  with meals and at bedtime.  420 mL  0  . valsartan (DIOVAN) 40 MG tablet Take 20-40 mg by mouth daily. 40mg  in the am and 20mg  in  the pm       Assessment: 73 y.o. male presents from [facility] with pancreatitis and CBD dilatation. Transferred to [facility] per family request for further evaluation. GI consulted. Noted pt on Unasyn at [facility]. To broaden to Vancomycin and Zosyn for intra-abd infection.   Labs at [facility] 7/29: WBC 7.9, SCr 0.96 (est CrCl ~70 ml/min)  Goal of Therapy:  Vancomycin trough level 10-15 mcg/ml  Plan:  1. Zosyn 3.375gm IV q8h. Each dose over 4 hours 2. Vancomycin 1gm IV q12h. 3. Will f/u micro data, pt's clinical condition, and renal function 4. Vanc trough prn  Sherlon Handing, PharmD, BCPS Clinical pharmacist, pager [phone] 04/29/2014,10:18 PM

## 2014-04-30 ENCOUNTER — Encounter (HOSPITAL_COMMUNITY): Payer: Self-pay | Admitting: General Practice

## 2014-04-30 DIAGNOSIS — Z01810 Encounter for preprocedural cardiovascular examination: Secondary | ICD-10-CM

## 2014-04-30 DIAGNOSIS — K8020 Calculus of gallbladder without cholecystitis without obstruction: Secondary | ICD-10-CM

## 2014-04-30 DIAGNOSIS — Z01818 Encounter for other preprocedural examination: Secondary | ICD-10-CM

## 2014-04-30 DIAGNOSIS — K861 Other chronic pancreatitis: Secondary | ICD-10-CM

## 2014-04-30 DIAGNOSIS — K838 Other specified diseases of biliary tract: Secondary | ICD-10-CM | POA: Diagnosis present

## 2014-04-30 LAB — PROTIME-INR
INR: 1.3 (ref 0.00–1.49)
Prothrombin Time: 16.2 seconds — ABNORMAL HIGH (ref 11.6–15.2)

## 2014-04-30 LAB — COMPREHENSIVE METABOLIC PANEL
ALBUMIN: 3 g/dL — AB (ref 3.5–5.2)
ALK PHOS: 156 U/L — AB (ref 39–117)
ALT: 97 U/L — ABNORMAL HIGH (ref 0–53)
AST: 74 U/L — ABNORMAL HIGH (ref 0–37)
Anion gap: 13 (ref 5–15)
BUN: 16 mg/dL (ref 6–23)
CHLORIDE: 104 meq/L (ref 96–112)
CO2: 23 meq/L (ref 19–32)
Calcium: 8.9 mg/dL (ref 8.4–10.5)
Creatinine, Ser: 0.94 mg/dL (ref 0.50–1.35)
GFR calc Af Amer: >90 mL/min (ref >90)
GFR calc non Af Amer: 80 mL/min — ABNORMAL LOW (ref >90)
Glucose, Bld: 87 mg/dL (ref 70–99)
Potassium: 3.7 mEq/L (ref 3.7–5.3)
Sodium: 140 mEq/L (ref 137–147)
Total Bilirubin: 3.2 mg/dL — ABNORMAL HIGH (ref 0.3–1.2)
Total Protein: 6.1 g/dL (ref 6.0–8.3)

## 2014-04-30 LAB — CREATININE, SERUM
CREATININE: 0.89 mg/dL (ref 0.50–1.35)
GFR calc Af Amer: >90 mL/min (ref >90)
GFR, EST NON AFRICAN AMERICAN: 82 mL/min — AB (ref >90)

## 2014-04-30 LAB — CBC
HCT: 39.1 % (ref 39.0–52.0)
Hemoglobin: 13.2 g/dL (ref 13.0–17.0)
MCH: 29.2 pg (ref 26.0–34.0)
MCHC: 33.8 g/dL (ref 30.0–36.0)
MCV: 86.5 fL (ref 78.0–100.0)
PLATELETS: 191 10*3/uL (ref 150–400)
RBC: 4.52 MIL/uL (ref 4.22–5.81)
RDW: 14.2 % (ref 11.5–15.5)
WBC: 9.1 10*3/uL (ref 4.0–10.5)

## 2014-04-30 LAB — LIPASE, BLOOD: LIPASE: 1406 U/L — AB (ref 11–59)

## 2014-04-30 LAB — LACTIC ACID, PLASMA: Lactic Acid, Venous: 2.3 mmol/L — ABNORMAL HIGH (ref 0.5–2.2)

## 2014-04-30 LAB — LACTATE DEHYDROGENASE: LDH: 185 U/L (ref 94–250)

## 2014-04-30 MED ORDER — PANTOPRAZOLE SODIUM 40 MG IV SOLR
40.0000 mg | Freq: Every day | INTRAVENOUS | Status: DC
  Filled 2014-04-30: qty 40

## 2014-04-30 MED ORDER — FLUTICASONE PROPIONATE 50 MCG/ACT NA SUSP
2.0000 | Freq: Every day | NASAL | Status: DC | PRN
  Filled 2014-04-30: qty 16

## 2014-04-30 MED ORDER — LORAZEPAM 1 MG PO TABS: 1.0000 mg | ORAL_TABLET | Freq: Every evening | ORAL | Status: DC | PRN

## 2014-04-30 MED ORDER — KCL IN DEXTROSE-NACL 20-5-0.9 MEQ/L-%-% IV SOLN
INTRAVENOUS | Status: DC
  Administered 2014-04-30 – 2014-05-02 (×5): via INTRAVENOUS
  Filled 2014-04-30 (×8): qty 1000

## 2014-04-30 MED ORDER — ACETAMINOPHEN 650 MG RE SUPP
650.0000 mg | RECTAL | Status: DC | PRN
  Administered 2014-04-30 (×2): 650 mg via RECTAL
  Filled 2014-04-30 (×2): qty 1

## 2014-04-30 MED ORDER — SODIUM CHLORIDE 0.9 % IV SOLN
3.0000 g | Freq: Four times a day (QID) | INTRAVENOUS | Status: DC
  Administered 2014-04-30 – 2014-05-03 (×13): 3 g via INTRAVENOUS
  Filled 2014-04-30 (×15): qty 3

## 2014-04-30 MED ORDER — CHLORHEXIDINE GLUCONATE 0.12 % MT SOLN
15.0000 mL | Freq: Two times a day (BID) | OROMUCOSAL | Status: DC
  Administered 2014-04-30 – 2014-05-05 (×8): 15 mL via OROMUCOSAL
  Filled 2014-04-30 (×13): qty 15

## 2014-04-30 MED ORDER — HYDROCODONE-ACETAMINOPHEN 5-325 MG PO TABS
1.0000 | ORAL_TABLET | ORAL | Status: DC | PRN
  Administered 2014-04-30 – 2014-05-04 (×4): 1 via ORAL
  Filled 2014-04-30 (×4): qty 1

## 2014-04-30 MED ORDER — CETYLPYRIDINIUM CHLORIDE 0.05 % MT LIQD
7.0000 mL | Freq: Two times a day (BID) | OROMUCOSAL | Status: DC
  Administered 2014-04-30 – 2014-05-03 (×6): 7 mL via OROMUCOSAL

## 2014-04-30 MED ORDER — ATORVASTATIN CALCIUM 80 MG PO TABS
80.0000 mg | ORAL_TABLET | ORAL | Status: DC
  Filled 2014-04-30: qty 1

## 2014-04-30 MED ORDER — AMLODIPINE BESYLATE 5 MG PO TABS
5.0000 mg | ORAL_TABLET | Freq: Every day | ORAL | Status: DC
  Administered 2014-04-30 – 2014-05-05 (×5): 5 mg via ORAL
  Filled 2014-04-30 (×6): qty 1

## 2014-04-30 MED ORDER — PANTOPRAZOLE SODIUM 40 MG PO TBEC
40.0000 mg | DELAYED_RELEASE_TABLET | Freq: Every day | ORAL | Status: DC
  Administered 2014-05-01 – 2014-05-05 (×4): 40 mg via ORAL
  Filled 2014-04-30 (×4): qty 1

## 2014-04-30 MED ORDER — PANTOPRAZOLE SODIUM 40 MG PO TBEC: 40.0000 mg | DELAYED_RELEASE_TABLET | Freq: Two times a day (BID) | ORAL | Status: DC

## 2014-04-30 MED ORDER — CARVEDILOL 3.125 MG PO TABS
3.1250 mg | ORAL_TABLET | Freq: Two times a day (BID) | ORAL | Status: DC
  Administered 2014-04-30 – 2014-05-05 (×11): 3.125 mg via ORAL
  Filled 2014-04-30 (×13): qty 1
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
## 2014-04-30 NOTE — Progress Notes (Signed)
Not sure what direction GI is going.  MRCP?  ERCP?  Need to wait until Lipase is down and symptoms have resolved.  Kathryne Eriksson. Dahlia Bailiff, MD, [place] [phone] [facility]

## 2014-04-30 NOTE — Progress Notes (Signed)
Note/chart reviewed. Agree with note.   Henrik Orihuela RD, LDN, CNSC [phone] Pager [phone] After Hours Pager   

## 2014-04-30 NOTE — Progress Notes (Signed)
Patient Demographics  [name], is a 73 y.o. male, DOB - 05-26-1941, NTZ:[id number]  Admit date - 04/29/2014   Admitting Physician Shanda Howells, MD  Outpatient Primary MD for the patient is VYAS,DHRUV B., MD  LOS - 1   No chief complaint on file.       Subjective:   Carles Florea today has, No headache, No chest pain, ++ epigastric abdominal pain - No Nausea, No new weakness tingling or numbness, No Cough - SOB.    Assessment & Plan    1. Gastric abdominal pain with findings consistent of CBD dilation, gallbladder wall thickening, intrahepatic biliary dilation and evidence of pancreatitis on CT scan at [facility]. Patient was at [facility] for the last 3 days and was due to undergo ERCP but family chose to get him transferred here.     He does have fever and leukocytosis, question if he has acute cholecystitis on top of acute pancreatitis. Will continue bowel rest, blood cultures drawn for her results, continue empiric antibiotics will discontinue vancomycin though. IV fluids, GI and general surgery informed to see the patient. Will get CMP stat.    2. CAD with RCA drug-eluting stent placed in December of 2014. Plavix was held at [facility], have consulted cardiology to formally comment on this, and his perioperative risk stratification. Currently chest pain-free symptom-free from cardiac standpoint.    3. Dyslipidemia - holding his statin due to pancreatitis n.p.o. status.    4. Hypertension. Oral Norvasc if able to tolerate.    5. GERD. IV PPI.    6.  H/O laryngeal cancer, treated with radiation therapy - outpatient followup no acute issues.       Code Status: Full  Family Communication: Daughter  Disposition Plan: TBD   Procedures     Consults  GI,  CCS   Medications  Scheduled Meds: . amLODipine  5 mg Oral Daily  . atorvastatin  80 mg Oral QODAY  . heparin  5,000 Units Subcutaneous 3 times per day  . pantoprazole (PROTONIX) IV  40 mg Intravenous QHS  . piperacillin-tazobactam (ZOSYN)  IV  3.375 g Intravenous 3 times per day  . vancomycin  1,000 mg Intravenous Q12H   Continuous Infusions: . sodium chloride 125 mL/hr at 04/30/14 0810   PRN Meds:.acetaminophen, fluticasone, LORazepam, morphine injection  DVT Prophylaxis   Heparin    Lab Results  Component Value Date   PLT 191 04/29/2014    Antibiotics     Anti-infectives   Start     Dose/Rate Route Frequency Ordered Stop   04/29/14 2300  piperacillin-tazobactam (ZOSYN) IVPB 3.375 g     3.375 g 12.5 mL/hr over 240 Minutes Intravenous 3 times per day 04/29/14 2231     04/29/14 2300  vancomycin (VANCOCIN) IVPB 1000 mg/200 mL premix     1,000 mg 200 mL/hr over 60 Minutes Intravenous Every 12 hours 04/29/14 2231     04/29/14 2245  ampicillin-sulbactam (UNASYN) 1.5 g in sodium chloride 0.9 % 50 mL IVPB  Status:  Discontinued     1.5 g 100 mL/hr over 30 Minutes Intravenous Every 6 hours 04/29/14 2202 04/29/14 2214          Objective:   Filed Vitals:   04/29/14 2201  04/30/14 0016 04/30/14 0204 04/30/14 0623  BP: 138/71   126/61  Pulse: 86   71  Temp: 101 F (38.3 C) 100.1 F (37.8 C) 99.2 F (37.3 C) 100.2 F (37.9 C)  TempSrc: Oral   Oral  Resp: 17   16  Height: 5\' 8"  (1.727 m)     Weight: 96.026 kg (211 lb 11.2 oz)     SpO2: 99%   97%    Wt Readings from Last 3 Encounters:  04/29/14 96.026 kg (211 lb 11.2 oz)  02/12/14 96.616 kg (213 lb)  10/13/13 98.884 kg (218 lb)    No intake or output data in the 24 hours ending 04/30/14 0912   Physical Exam  Awake Alert, Oriented X 3, No new F.N deficits, Normal affect [place].AT,PERRAL Supple Neck,No JVD, No cervical lymphadenopathy appriciated.  Symmetrical Chest wall movement, Good air movement bilaterally,  CTAB RRR,No Gallops,Rubs or new Murmurs, No Parasternal Heave +ve B.Sounds, Abd Soft, ++ epigastric tenderness, No organomegaly appriciated, No rebound - guarding or rigidity. No Cyanosis, Clubbing or edema, No new Rash or bruise      Data Review   Micro Results No results found for this or any previous visit (from the past 240 hour(s)).  Radiology Reports No results found.  CBC  Recent Labs Lab 04/29/14 2325  WBC 9.1  HGB 13.2  HCT 39.1  PLT 191  MCV 86.5  MCH 29.2  MCHC 33.8  RDW 14.2    Chemistries   Recent Labs Lab 04/29/14 2325  CREATININE 0.89   ------------------------------------------------------------------------------------------------------------------ estimated creatinine clearance is 81.8 ml/min (by C-G formula based on Cr of 0.89). ------------------------------------------------------------------------------------------------------------------ No results found for this basename: HGBA1C,  in the last 72 hours ------------------------------------------------------------------------------------------------------------------ No results found for this basename: CHOL, HDL, LDLCALC, TRIG, CHOLHDL, LDLDIRECT,  in the last 72 hours ------------------------------------------------------------------------------------------------------------------ No results found for this basename: TSH, T4TOTAL, FREET3, T3FREE, THYROIDAB,  in the last 72 hours ------------------------------------------------------------------------------------------------------------------ No results found for this basename: VITAMINB12, FOLATE, FERRITIN, TIBC, IRON, RETICCTPCT,  in the last 72 hours  Coagulation profile No results found for this basename: INR, PROTIME,  in the last 168 hours  No results found for this basename: DDIMER,  in the last 72 hours  Cardiac Enzymes No results found for this basename: CK, CKMB, TROPONINI, MYOGLOBIN,  in the last 168  hours ------------------------------------------------------------------------------------------------------------------ No components found with this basename: POCBNP,      Time Spent in minutes   35   Lailana Shira K M.D on 04/30/2014 at 9:12 AM  Between 7am to 7pm - Pager - [phone]  After 7pm go to [email or web address] - password [email or web address]  And look for the night coverage person covering for me after hours  [facility] Office  [phone]   **Disclaimer: This note may have been dictated with voice recognition software. Similar sounding words can inadvertently be transcribed and this note may contain transcription errors which may not have been corrected upon publication of note.**

## 2014-04-30 NOTE — Progress Notes (Signed)
Patient states that he has a CPAP at home but does not want to use one at this time.  He said he would request a CPAP if he wanted one.

## 2014-04-30 NOTE — Progress Notes (Addendum)
Pharmacy vanc/zosyn update:  Abx were started to r/o abd infection. TRH and GI's note both mentioned to dc abx.   Plan  Dc vanc/zosyn  Addendum:  Unasyn has been ordered to cover empirically.   [place], PharmD Pager: [phone] 04/30/2014 2:25 PM
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
## 2014-04-30 NOTE — Progress Notes (Signed)
PT refused CPAP. PT knows he can call RT if he changes his mind. RT will continue to monitor.

## 2014-04-30 NOTE — Consult Note (Signed)
CARDIOLOGY CONSULT NOTE   Patient ID: [name] MRN: [id number], DOB/AGE: 1941/09/27   Admit date: 04/29/2014 Date of Consult: 04/30/2014  Primary Physician: Glenda Chroman., MD Primary Cardiologist: Dr Darlina Guys  Reason for consult:  Acute pancreatitis, preop-evaluation  Problem List  Past Medical History  Diagnosis Date  . Hypertension   . Cancer 1998    throat  . GERD (gastroesophageal reflux disease)   . Arthritis   . Shortness of breath   . Sleep apnea     does not use machine as directed  . CAD (coronary artery disease), native coronary artery      PTCA distal CFX 2008 Dr. Einar Gip; non-obstructive by cath July 2014; s/p DES to the proximal LAD 09/10/2013  . Hyperlipemia    . Bradycardia, drug induced 11/04/2011  . H/O laryngeal cancer, treated with radiation therapy 11/04/2011  . Aortic root dilatation, history of in 2008      Past Surgical History  Procedure Laterality Date  . Cardiac catheterization    . Patella fracture surgery    . Colonoscopy w/ biopsies and polypectomy       Allergies  No Known Allergies  HPI   This is a 73 y.o. year old male with significant past medical history of HTN, CAD s/p stenting, laryngeal cancer s/p radiation therapy 2013 presenting with pancreatitis, CBD dilatation. Pt states that he has had -3-4 months of recurrent abd pain and nausea. States that he has been seen for similar sxs in the ER here in the past. Presented to ER in [place] with worsening sxs. Notable labs include WBC 7.9, hgb 14.9, ALP 176, AST 109, ALT 69. LDH 203, K 3.4, t bili 1.6, Cr 0.96, bicarb 27. Lipase 1400. Amylase 1500. CT abd showed 7.8 mm gb wall thickening, 12.7 cm bile duct dilatation. + intrahepatic biliary dilatation. Was consulted by GI in [place]. ? Planned ERCP. Started on unasyn. Family requested tx to [facility] for further management.  On presentation, tmax 101, otherwise hemodynamically stable, mentating well.  The patient has know CAD and is  a patient of Dr Angelena Form. He was admitted to [facility] 09/10/13 with unstable angina. Found to have severe proximal LAD stenosis treated with a 3.0 x 18 mm Xience DES. Also has 60% mid RCA stenosis, Echo 10/12/13 with normal LV function, mild AI, mild MR. She has been on dual antiplatelet therapy until week ago when it was stopped for planned EGD. He was last seen in the clinic in May 2015, he has residual moderate disease in RCA and LCX and medical therapy was recommended. He reports the last episode of chest pain 3 weeks ago, it was worked up and concluded as non-cardiac.   Inpatient Medications  . amLODipine  5 mg Oral Daily  . heparin  5,000 Units Subcutaneous 3 times per day  . pantoprazole (PROTONIX) IV  40 mg Intravenous QHS  . piperacillin-tazobactam (ZOSYN)  IV  3.375 g Intravenous 3 times per day  . vancomycin  1,000 mg Intravenous Q12H    Family History Family History  Problem Relation Age of Onset  . Cancer Brother      Social History History   Social History  . Marital Status: Married    Spouse Name: N/A    Number of Children: N/A  . Years of Education: N/A   Occupational History  . Retired    Social History Main Topics  . Smoking status: Former Smoker    Quit date: 08/03/1997  . Smokeless tobacco:  Not on file  . Alcohol Use: No  . Drug Use: No  . Sexual Activity: Yes    Birth Control/ Protection: None   Other Topics Concern  . Not on file   Social History Narrative   Lives in apartment with wife.     Review of Systems  General:  No chills, fever, night sweats or weight changes.  Cardiovascular:  No chest pain, dyspnea on exertion, edema, orthopnea, palpitations, paroxysmal nocturnal dyspnea. Dermatological: No rash, lesions/masses Respiratory: No cough, dyspnea Urologic: No hematuria, dysuria Abdominal:   No nausea, vomiting, diarrhea, bright red blood per rectum, melena, or hematemesis Neurologic:  No visual changes, wkns, changes in mental status. All  other systems reviewed and are otherwise negative except as noted above.  Physical Exam  Blood pressure 126/61, pulse 71, temperature 100.2 F (37.9 C), temperature source Oral, resp. rate 16, height 5\' 8"  (1.727 m), weight 211 lb 11.2 oz (96.026 kg), SpO2 97.00%.  General: Pleasant, NAD Psych: Normal affect. Neuro: Alert and oriented X 3. Moves all extremities spontaneously. HEENT: Normal  Neck: Supple without bruits or JVD. Lungs:  Resp regular and unlabored, CTA. Heart: RRR no s3, s4, 2/6 systolic murmur. Abdomen: Soft, non-tender, non-distended, BS + x 4.  Extremities: No clubbing, cyanosis or edema. DP/PT/Radials 2+ and equal bilaterally.  Labs  No results found for this basename: CKTOTAL, CKMB, TROPONINI,  in the last 72 hours Lab Results  Component Value Date   WBC 9.1 04/29/2014   HGB 13.2 04/29/2014   HCT 39.1 04/29/2014   MCV 86.5 04/29/2014   PLT 191 04/29/2014    Recent Labs Lab 04/29/14 2325  CREATININE 0.89   Lab Results  Component Value Date   CHOL 172 04/18/2013   HDL 41 04/18/2013   LDLCALC 109* 04/18/2013   TRIG 108 04/18/2013   Lab Results  Component Value Date   DDIMER 0.32 09/10/2013   No components found with this basename: POCBNP,   Radiology/Studies  Echocardiogram - 10/12/2013 Left ventricle: The cavity size was mildly dilated. The estimated ejection fraction was 55%. Wall motion was normal; there were no regional wall motion abnormalities. There was an increased relative contribution of atrial contraction to ventricular filling. Doppler parameters are consistent with abnormal left ventricular relaxation (grade 1 diastolic dysfunction). - Aortic valve: Mild regurgitation. - Mitral valve: Mild regurgitation. Valve area by pressure half-time: 1.75cm^2.  ECG - SR, iRBBB, LAFB     ASSESSMENT AND PLAN  73 year old male with an acute pancreatitis requiring ERCP  1. CAD, s/p PCI/LAD in 12/15, residual moderate disease in RCA and LCX.  Previously on dual antiplatelet therapy, now held. We would recommend to restart as soon as safe from GI standpoint. We would recommend to start low dose BB- carvedilol 3.125 mg PO BID in the perioperative period. Continue statin.   2. Preoperative risk assessment - the patient currently doesn't have signs of unstable angina or CHF. He is able to achieve at least 4 METS. He is considered a moderate risk for an intermediate risk surgery. There should be no contraindication from cardiac standpoint to proceed with ERCP.   3. HTN - well controlled.   Signed, Dorothy Spark, MD, [facility] 04/30/2014, 9:22 AM

## 2014-04-30 NOTE — Consult Note (Addendum)
[facility] Consult: 9:25 AM 04/30/2014  LOS: 1 day    Referring Provider: Dr Candiss Norse Primary Care Physician:  Glenda Chroman., MD Primary Gastroenterologist:  Dr Britta Mccreedy in [place].      Reason for Consultation:  Pancreatitis.    HPI: [name] is a 73 y.o. male.  On chronic Plavix for 08/2013 DES to LAD. On CPAP for OSA.  Hx laryngeal cancer treated with radiation.    Previous GI hx of colon polyps, several colonoscopies and polypectomies in  x 1, subsequent studies at [facility].  Last was ~ 3 yrs ago.   Has had several months of upper abdominal pain.  Often post prandial after fatty and spicey foods.  Increasing frequency.  Ultrasound 6/13: fatty liver vs hepatocellular disease as had imhoogeneous  Parenchyma, 2.5 mm CBD, 3.6 cm AAA.  Dexilant added 2 weeks ago, helping but not curative.  Had EGD by Dr Britta Mccreedy [facility] 7/29.  This apparently unremarkable except for Schatski's ring.  Further tests ordered but pt had intense pain and then new N/V wich sent him to [facility] ED.   CT scan in [place] showed no gall stones. 12.7 cm (?????) CBD, intrahepatic duct dilatation, 7.8 mm GB wall.  Lipase 3308.  Alk phos 176.  AST/ALT 69/109.  T bili 1.6.    Transferred to [place] last night. Is NPO.  2 weeks ago saw small amount of blood with stool once. Urine is dark , he thinks it looks like blood, for 2 to 3 days.   Past Medical History  Diagnosis Date  . Hypertension   . Cancer 1998    throat  . GERD (gastroesophageal reflux disease)   . Arthritis   . Shortness of breath   . Sleep apnea     does not use machine as directed  . CAD (coronary artery disease), native coronary artery      PTCA distal CFX 2008 Dr. Einar Gip; non-obstructive by cath July 2014; s/p DES to the proximal LAD 09/10/2013  .  Hyperlipemia    . Bradycardia, drug induced 11/04/2011  . H/O laryngeal cancer, treated with radiation therapy 11/04/2011  . Aortic root dilatation, history of in 2008      Past Surgical History  Procedure Laterality Date  . Cardiac catheterization    . Patella fracture surgery    . Colonoscopy w/ biopsies and polypectomy      Prior to Admission medications   Medication Sig Start Date End Date Taking? Authorizing Provider  amLODipine (NORVASC) 5 MG tablet Take 1 tablet (5 mg total) by mouth daily. 10/26/13   Burtis Junes, NP  aspirin EC 81 MG tablet Take 1 tablet (81 mg total) by mouth daily. 04/18/13   Brooke O Edmisten, PA-C  atorvastatin (LIPITOR) 80 MG tablet Take 80 mg by mouth every other day.     Historical Provider, MD  cetirizine (ZYRTEC) 10 MG tablet Take 10 mg by mouth daily.    Historical Provider, MD  clopidogrel (PLAVIX) 75 MG tablet Take 1 tablet (75 mg total) by mouth daily  with breakfast. 10/21/13   Burtis Junes, NP  docusate sodium (COLACE) 100 MG capsule Take 100 mg by mouth 2 (two) times daily.    Historical Provider, MD  fluticasone (FLONASE) 50 MCG/ACT nasal spray Place 2 sprays into the nose daily as needed for rhinitis.    Historical Provider, MD  LORazepam (ATIVAN) 1 MG tablet Take 1 mg by mouth at bedtime as needed for sleep. For sleep    Historical Provider, MD  magnesium hydroxide (MILK OF MAGNESIA) 400 MG/5ML suspension Take 15 mLs by mouth daily as needed for mild constipation or moderate constipation.    Historical Provider, MD  nitroGLYCERIN (NITROSTAT) 0.4 MG SL tablet Place 0.4 mg under the tongue every 5 (five) minutes as needed for chest pain.    Historical Provider, MD  pantoprazole (PROTONIX) 40 MG tablet Take 40 mg by mouth 2 (two) times daily.     Historical Provider, MD  PRESCRIPTION MEDICATION Place 1 drop into both eyes 3 (three) times daily. Eye drops for burry vision    Historical Provider, MD  sucralfate (CARAFATE) 1 GM/10ML suspension Take 10  mLs (1 g total) by mouth 4 (four) times daily -  with meals and at bedtime. 03/13/14   Blanchie Dessert, MD  valsartan (DIOVAN) 40 MG tablet Take 20-40 mg by mouth daily. 75m in the am and 23min the pm    Historical Provider, MD    Scheduled Meds: . amLODipine  5 mg Oral Daily  . heparin  5,000 Units Subcutaneous 3 times per day  . pantoprazole (PROTONIX) IV  40 mg Intravenous QHS  . piperacillin-tazobactam (ZOSYN)  IV  3.375 g Intravenous 3 times per day  . vancomycin  1,000 mg Intravenous Q12H   Infusions: . sodium chloride 125 mL/hr at 04/30/14 0810   PRN Meds: acetaminophen, fluticasone, LORazepam, morphine injection   Allergies as of 04/29/2014  . (No Known Allergies)    Family History  Problem Relation Age of Onset  . Cancer Brother     History   Social History  . Marital Status: Married    Spouse Name: N/A    Number of Children: N/A  . Years of Education: N/A   Occupational History  . Retired    Social History Main Topics  . Smoking status: Former Smoker    Quit date: 08/03/1997  . Smokeless tobacco: Not on file  . Alcohol Use: No  . Drug Use: No  . Sexual Activity: Yes    Birth Control/ Protection: None          Social History Narrative   Lives in apartment with wife.    REVIEW OF SYSTEMS: Constitutional:  12 # wt loss in 4 months ENT:  No nose bleeds Pulm:  Stable DOE.  No cough CV:  No palpitations, no LE edema.  GU:  No hematuria, no frequency GI:  Per HPI Heme:  No unusual or excessive bleeding   Transfusions:  none Neuro:  No headaches, no peripheral tingling or numbness Derm:  No itching, no rash or sores.  Endocrine:  No sweats or chills.  No polyuria or dysuria Immunization:  Not queried.  Travel:  None beyond local counties in last few months.    PHYSICAL EXAM: Vital signs in last 24 hours: Filed Vitals:   04/30/14 0623  BP: 126/61  Pulse: 71  Temp: 100.2 F (37.9 C)  Resp: 16   Wt Readings from Last 3 Encounters:    04/29/14 96.026 kg (211 lb 11.2  oz)  02/12/14 96.616 kg (213 lb)  10/13/13 98.884 kg (218 lb)    General: pleasant, comfortable .  Looks well.   Head:  No swelling or asymmetry  Eyes:  No icterus or pallor Ears:  Not HOH  Nose:  No discharge Mouth:  Clear , moist, tongue midline Neck:  No mass, no JVD Lungs:  Diminished globally, poor air movement.  No dyspnea of cough,  No rales/tonchi Heart: RRR.  No MRG Abdomen:  Soft, tender in ruq and epigastrium.  No guard or rebound.  Active BS.  Not distended. .   Rectal: deferred   Musc/Skeltl: no swelling or  Deformed joints Extremities:  No CCE  Neurologic:  Oriented x 3.  No limb weakness.  No tremor Skin:  No jaundice Tattoos:  None seen Nodes:  No cervical adenopathy   Psych:  Pleasant, relaxed, fluid speech.  Good historian, engaged.    LAB RESULTS: Lab Results  Component Value Date   WBC 9.1 04/29/2014   HGB 13.2 04/29/2014   HCT 39.1 04/29/2014   MCV 86.5 04/29/2014   PLT 191 04/29/2014     LFT  Lab Results  Component Value Date   ALT 97* 04/30/2014   AST 74* 04/30/2014   ALKPHOS 156* 04/30/2014   BILITOT 3.2* 04/30/2014         Component Value Date/Time   LIPASE 1406* 04/29/2014 2325    RADIOLOGY STUDIES: [facility] report text  CLINICAL DATA: Acute pancreatitis  EXAM: ULTRASOUND ABDOMEN COMPLETE  COMPARISON: 04/28/2014  FINDINGS: Gallbladder:  There is thickening of gallbladder wall up to 7.8 mm. No gallstones are noted within gallbladder. Positive sonographic Murphy sign. Clinical correlation is necessary to exclude cholecystitis.  Common bile duct:  Diameter: 1.7 cm in diameter.  Liver:  No focal hepatic mass. Mild intrahepatic biliary ductal dilatation.  IVC:  No abnormality visualized.  Pancreas:  Limited assessment of the pancreas due to bowel gas  Spleen:  Size and appearance within normal limits. Measures 6.6 cm in length  Right Kidney:  Length: 11 cm. Echogenicity  within normal limits. No mass or hydronephrosis visualized.  Left Kidney:  Length: 11.2 cm. Echogenicity within normal limits. No mass or hydronephrosis visualized.  Abdominal aorta:  No aneurysm visualized. Measures up to 2.8 cm in diameter.  Other findings:  None.  IMPRESSION: 1. No shadowing gallstones are noted within gallbladder. There is thickening of gallbladder wall up to 7.8 mm. Positive sonographic Murphy's sign. Clinical correlation is necessary to exclude cholecystitis. 2. Intrahepatic biliary ductal dilatation. CBD dilatation up to 1.7 cm. 3. No hydronephrosis or diagnostic renal calculus.   Electronically Signed By: Lahoma Crocker M.D. On: 04/29/2014 09:47   Diagnostic report text  CLINICAL DATA: Abdominal pain.  EXAM: CT ABDOMEN AND PELVIS WITH CONTRAST  TECHNIQUE: Multidetector CT imaging of the abdomen and pelvis was performed using the standard protocol following bolus administration of intravenous contrast.  CONTRAST: 100 cc Isovue 370  COMPARISON: Abdominal ultrasound dated 03/13/2014  FINDINGS: There is diffuse soft tissue stranding around the pancreas consistent with acute pancreatitis. No pancreatic necrosis or mass lesion. The common bile duct is dilated to a diameter of 16 mm. There is slight distention of the gallbladder and of the intrahepatic bile ducts. There is no visible mass or stone.  Liver parenchyma, spleen, adrenal glands, and kidneys are normal except for an 8 mm cyst in the lower pole of the left kidney. The bowel is normal including the terminal ileum and appendix. Bladder is  normal. There is calcification in the abdominal aorta.  No acute osseous abnormality. Grade 1 spondylolisthesis of L5 on S1 due to bilateral pars defects.  IMPRESSION: Findings consistent with acute pancreatitis.  Dilated biliary tree without a visible stone or mass. The common bile duct is dilated to the level of the ampulla.   Electronically  Signed By: Rozetta Nunnery M.D. On: 04/28/2014 23:40       ENDOSCOPIC STUDIES: Per HPI  IMPRESSION:   *  Acute pancreatitis.  Rule out biliary.  Dilated bile ducts on outside imaging suggest this is case - No ETOH history.  *  EGD 2 days ago 7/29:  Apparently saw Schatski's ring  *   Hx colon polyps.  Last colonoscopy was ~ 3 yrs ago at the [place].  Had single episode blood in stool about 2 weeks ago, none since.  *  Chronic Plavix for 08/2013 DES.  Plavix stopped on 7/22 for 7/29 EGD, took one dose on 7/30 but none since.       PLAN:          Azucena Freed  04/30/2014, 9:25 AM Pager: [phone]    [facility] Attending  I have also seen and assessed the patient and agree with the above note with additions.  1) Inclined to pursue an MRCP tomorrow most likely - will f/u in AM 2) Thickened GB wall at [facility] [place] does raise ? Cholecystitis - agree w/ Abx coverage 3) Is NPO and no D5 - will change IVF's  Gatha Mayer, MD, [facility] [phone] (pager) 04/30/2014 6:09 PM

## 2014-05-01 ENCOUNTER — Inpatient Hospital Stay (HOSPITAL_COMMUNITY): Payer: Medicare Other

## 2014-05-01 DIAGNOSIS — Z01818 Encounter for other preprocedural examination: Secondary | ICD-10-CM

## 2014-05-01 DIAGNOSIS — K838 Other specified diseases of biliary tract: Secondary | ICD-10-CM

## 2014-05-01 LAB — CBC
HCT: 39.2 % (ref 39.0–52.0)
HEMOGLOBIN: 13 g/dL (ref 13.0–17.0)
MCH: 29.3 pg (ref 26.0–34.0)
MCHC: 33.2 g/dL (ref 30.0–36.0)
MCV: 88.3 fL (ref 78.0–100.0)
Platelets: 175 10*3/uL (ref 150–400)
RBC: 4.44 MIL/uL (ref 4.22–5.81)
RDW: 14.2 % (ref 11.5–15.5)
WBC: 9.6 10*3/uL (ref 4.0–10.5)

## 2014-05-01 LAB — COMPREHENSIVE METABOLIC PANEL
ALT: 60 U/L — ABNORMAL HIGH (ref 0–53)
ANION GAP: 8 (ref 5–15)
AST: 40 U/L — ABNORMAL HIGH (ref 0–37)
Albumin: 2.7 g/dL — ABNORMAL LOW (ref 3.5–5.2)
Alkaline Phosphatase: 127 U/L — ABNORMAL HIGH (ref 39–117)
BUN: 14 mg/dL (ref 6–23)
CHLORIDE: 105 meq/L (ref 96–112)
CO2: 24 meq/L (ref 19–32)
CREATININE: 0.83 mg/dL (ref 0.50–1.35)
Calcium: 8.9 mg/dL (ref 8.4–10.5)
GFR, EST NON AFRICAN AMERICAN: 85 mL/min — AB (ref >90)
GLUCOSE: 97 mg/dL (ref 70–99)
Potassium: 4 mEq/L (ref 3.7–5.3)
Sodium: 137 mEq/L (ref 137–147)
Total Bilirubin: 1.3 mg/dL — ABNORMAL HIGH (ref 0.3–1.2)
Total Protein: 5.8 g/dL — ABNORMAL LOW (ref 6.0–8.3)

## 2014-05-01 LAB — LIPASE, BLOOD: Lipase: 71 U/L — ABNORMAL HIGH (ref 11–59)

## 2014-05-01 MED ORDER — GADOBENATE DIMEGLUMINE 529 MG/ML IV SOLN
20.0000 mL | Freq: Once | INTRAVENOUS | Status: AC | PRN
  Administered 2014-05-01: 20 mL via INTRAVENOUS

## 2014-05-01 NOTE — Progress Notes (Signed)
Weaned pt off oxygen via nasal cannula. Pt O2 sats are 91-93% on room air resting comfortably in bed. Encouraged pt to wear CPAP tonight.

## 2014-05-01 NOTE — Progress Notes (Signed)
[facility] Progress Note     Subjective: Pt's pain is the same as yesterday.  NPO.  Ambulating OOB.  No N/V, some flatus, no BM.  Has O2 on.  Objective: Vital signs in last 24 hours: Temp:  [98.8 F (37.1 C)-100 F (37.8 C)] 99.6 F (37.6 C) (08/01 0450) Pulse Rate:  [69-72] 72 (08/01 0450) Resp:  [16-18] 18 (08/01 0450) BP: (125-157)/(66-87) 131/69 mmHg (08/01 0450) SpO2:  [90 %-91 %] 90 % (08/01 0450) Last BM Date: 04/28/14  Intake/Output from previous day: 07/31 0701 - 08/01 0700 In: 1236.7 [P.O.:120; I.V.:1116.7] Out: -  Intake/Output this shift:    PE: Gen:  Alert, NAD, pleasant Abd: Soft, ND, tender to palpation in upper abdomen, +BS, no HSM, no surgical scars   Lab Results:   Recent Labs  04/29/14 2325 05/01/14 0600  WBC 9.1 9.6  HGB 13.2 13.0  HCT 39.1 39.2  PLT 191 175   BMET  Recent Labs  04/30/14 0950 05/01/14 0600  NA 140 137  K 3.7 4.0  CL 104 105  CO2 23 24  GLUCOSE 87 97  BUN 16 14  CREATININE 0.94 0.83  CALCIUM 8.9 8.9   PT/INR  Recent Labs  04/30/14 0950  LABPROT 16.2*  INR 1.30   CMP     Component Value Date/Time   NA 137 05/01/2014 0600   K 4.0 05/01/2014 0600   CL 105 05/01/2014 0600   CO2 24 05/01/2014 0600   GLUCOSE 97 05/01/2014 0600   BUN 14 05/01/2014 0600   CREATININE 0.83 05/01/2014 0600   CALCIUM 8.9 05/01/2014 0600   PROT 5.8* 05/01/2014 0600   ALBUMIN 2.7* 05/01/2014 0600   AST 40* 05/01/2014 0600   ALT 60* 05/01/2014 0600   ALKPHOS 127* 05/01/2014 0600   BILITOT 1.3* 05/01/2014 0600   GFRNONAA 85* 05/01/2014 0600   GFRAA >90 05/01/2014 0600   Lipase     Component Value Date/Time   LIPASE 71* 05/01/2014 0600       Studies/Results: No results found.  Anti-infectives: Anti-infectives   Start     Dose/Rate Route Frequency Ordered Stop   04/30/14 1500  Ampicillin-Sulbactam (UNASYN) 3 g in sodium chloride 0.9 % 100 mL IVPB     3 g 100 mL/hr over 60 Minutes Intravenous Every 6 hours 04/30/14 1424     04/29/14  2300  piperacillin-tazobactam (ZOSYN) IVPB 3.375 g  Status:  Discontinued     3.375 g 12.5 mL/hr over 240 Minutes Intravenous 3 times per day 04/29/14 2231 04/30/14 1251   04/29/14 2300  vancomycin (VANCOCIN) IVPB 1000 mg/200 mL premix  Status:  Discontinued     1,000 mg 200 mL/hr over 60 Minutes Intravenous Every 12 hours 04/29/14 2231 04/30/14 1301   04/29/14 2245  ampicillin-sulbactam (UNASYN) 1.5 g in sodium chloride 0.9 % 50 mL IVPB  Status:  Discontinued     1.5 g 100 mL/hr over 30 Minutes Intravenous Every 6 hours 04/29/14 2202 04/29/14 2214       Assessment/Plan Gallstone pancreatitis  Cholecystitis ?Choledocholithiasis   Plan:  1. With how tender the patient is and his age would continue Unasyn to treat prophylactically for cholecystitis. Would not be surprised if he had a very infected or necrotic gallbladder.  2. NPO until pain resolves (any PO may cause pancreas to be more inflamed), bowel rest, IVF, pain control, antiemetics  3. SCD's and heparin for DVT proph  4. Ambulate and IS  5. Await improvement in lipase and  pain prior to proceeding with surgical interventions. Getting MRCP today. DES complicates things with his surgery, and his plavix use, but apparently he hasn't been getting over the last 5 days, but another note said he got one dose on 7/30.  6. Recheck labs in am  7. Cards considers him a moderate risk for intermediate risk surgery 8.  If MRCP negative may be able to proceed with surgery on Sun/Mon, if positive will need ERCP first then lap chole the following day.     LOS: 2 days    Coralie Keens 05/01/2014, 7:51 AM Pager: [phone]

## 2014-05-01 NOTE — Progress Notes (Signed)
I have seen and examined the patient and agree with the assessment and plans. MRCP today.  If negative, then lap chole in next 24 to 48 hours  [name] A. Zachary Linden  MD, FACS

## 2014-05-01 NOTE — Progress Notes (Signed)
Patient Demographics  [name], is a 73 y.o. male, DOB - 16-Jun-1941, SPQ:[id number]  Admit date - 04/29/2014   Admitting Physician Shanda Howells, MD  Outpatient Primary MD for the patient is VYAS,DHRUV B., MD  LOS - 2   No chief complaint on file.       Subjective:   [name] today has, No headache, No chest pain, ++ epigastric abdominal pain - No Nausea, No new weakness tingling or numbness, No Cough - SOB.    Assessment & Plan    1. Gastric abdominal pain with findings consistent of CBD dilation, gallbladder wall thickening, intrahepatic biliary dilation and evidence of pancreatitis on CT scan at [facility]. Patient was at [facility] for the last 3 days prior to admission and was due to undergo ERCP but family chose to get him transferred here.     He does have fever and leukocytosis which is improving, question if he has acute cholecystitis on top of acute pancreatitis. Will continue bowel rest, blood cultures drawn for her results, continue empiric Unasyn and IV fluids, GI and general surgery both following the patient, due for ERCP on 05/01/2014 most likely will require laparoscopic cholecystectomy in the next day or so.   2. CAD with RCA drug-eluting stent placed in December of 2014. Plavix - ASA , cardiology on board and following, resume antiplatelets as soon as stable from GI and surgery standpoint. Perioperative Coreg continued.    3. Dyslipidemia - holding his statin due to pancreatitis n.p.o. status.    4. Hypertension. Oral Norvasc and Coreg onboard. ARB held for now.    5. GERD. IV PPI.    6.  H/O laryngeal cancer, treated with radiation therapy - outpatient followup no acute issues.       Code Status: Full  Family Communication:  Daughter  Disposition Plan: TBD   Procedures     Consults  GI, CCS   Medications  Scheduled Meds: . amLODipine  5 mg Oral Daily  . ampicillin-sulbactam (UNASYN) IV  3 g Intravenous Q6H  . antiseptic oral rinse  7 mL Mouth Rinse BID  . carvedilol  3.125 mg Oral BID WC  . chlorhexidine  15 mL Mouth Rinse BID  . heparin  5,000 Units Subcutaneous 3 times per day  . pantoprazole  40 mg Oral Q0600   Continuous Infusions: . dextrose 5 % and 0.9 % NaCl with KCl 20 mEq/L 125 mL/hr at 05/01/14 0423   PRN Meds:.acetaminophen, fluticasone, HYDROcodone-acetaminophen, LORazepam, morphine injection  DVT Prophylaxis   Heparin    Lab Results  Component Value Date   PLT 175 05/01/2014    Antibiotics     Anti-infectives   Start     Dose/Rate Route Frequency Ordered Stop   04/30/14 1500  Ampicillin-Sulbactam (UNASYN) 3 g in sodium chloride 0.9 % 100 mL IVPB     3 g 100 mL/hr over 60 Minutes Intravenous Every 6 hours 04/30/14 1424     04/29/14 2300  piperacillin-tazobactam (ZOSYN) IVPB 3.375 g  Status:  Discontinued     3.375 g 12.5 mL/hr over 240 Minutes Intravenous 3 times per day 04/29/14 2231 04/30/14 1251   04/29/14 2300  vancomycin (VANCOCIN) IVPB 1000 mg/200 mL premix  Status:  Discontinued  1,000 mg 200 mL/hr over 60 Minutes Intravenous Every 12 hours 04/29/14 2231 04/30/14 1301   04/29/14 2245  ampicillin-sulbactam (UNASYN) 1.5 g in sodium chloride 0.9 % 50 mL IVPB  Status:  Discontinued     1.5 g 100 mL/hr over 30 Minutes Intravenous Every 6 hours 04/29/14 2202 04/29/14 2214          Objective:   Filed Vitals:   04/30/14 1616 04/30/14 2041 05/01/14 0450 05/01/14 0906  BP: 147/87 134/66 131/69 129/70  Pulse: 70 70 72 71  Temp:  98.8 F (37.1 C) 99.6 F (37.6 C)   TempSrc:  Oral Oral   Resp:  18 18   Height:      Weight:      SpO2:  90% 90%     Wt Readings from Last 3 Encounters:  04/29/14 96.026 kg (211 lb 11.2 oz)  02/12/14 96.616 kg (213 lb)  10/13/13  98.884 kg (218 lb)     Intake/Output Summary (Last 24 hours) at 05/01/14 0955 Last data filed at 05/01/14 0600  Gross per 24 hour  Intake 1236.67 ml  Output      0 ml  Net 1236.67 ml     Physical Exam  Awake Alert, Oriented X 3, No new F.N deficits, Normal affect [place].AT,PERRAL Supple Neck,No JVD, No cervical lymphadenopathy appriciated.  Symmetrical Chest wall movement, Good air movement bilaterally, CTAB RRR,No Gallops,Rubs or new Murmurs, No Parasternal Heave +ve B.Sounds, Abd Soft, ++ epigastric tenderness, No organomegaly appriciated, No rebound - guarding or rigidity. No Cyanosis, Clubbing or edema, No new Rash or bruise      Data Review   Micro Results No results found for this or any previous visit (from the past 240 hour(s)).  Radiology Reports No results found.  CBC  Recent Labs Lab 04/29/14 2325 05/01/14 0600  WBC 9.1 9.6  HGB 13.2 13.0  HCT 39.1 39.2  PLT 191 175  MCV 86.5 88.3  MCH 29.2 29.3  MCHC 33.8 33.2  RDW 14.2 14.2    Chemistries   Recent Labs Lab 04/29/14 2325 04/30/14 0950 05/01/14 0600  NA  --  140 137  K  --  3.7 4.0  CL  --  104 105  CO2  --  23 24  GLUCOSE  --  87 97  BUN  --  16 14  CREATININE 0.89 0.94 0.83  CALCIUM  --  8.9 8.9  AST  --  74* 40*  ALT  --  97* 60*  ALKPHOS  --  156* 127*  BILITOT  --  3.2* 1.3*   ------------------------------------------------------------------------------------------------------------------ estimated creatinine clearance is 87.7 ml/min (by C-G formula based on Cr of 0.83). ------------------------------------------------------------------------------------------------------------------ No results found for this basename: HGBA1C,  in the last 72 hours ------------------------------------------------------------------------------------------------------------------ No results found for this basename: CHOL, HDL, LDLCALC, TRIG, CHOLHDL, LDLDIRECT,  in the last 72  hours ------------------------------------------------------------------------------------------------------------------ No results found for this basename: TSH, T4TOTAL, FREET3, T3FREE, THYROIDAB,  in the last 72 hours ------------------------------------------------------------------------------------------------------------------ No results found for this basename: VITAMINB12, FOLATE, FERRITIN, TIBC, IRON, RETICCTPCT,  in the last 72 hours  Coagulation profile  Recent Labs Lab 04/30/14 0950  INR 1.30    No results found for this basename: DDIMER,  in the last 72 hours  Cardiac Enzymes No results found for this basename: CK, CKMB, TROPONINI, MYOGLOBIN,  in the last 168 hours ------------------------------------------------------------------------------------------------------------------ No components found with this basename: POCBNP,      Time Spent in minutes   35  Thurnell Lose M.D on 05/01/2014 at 9:55 AM  Between 7am to 7pm - Pager - [phone]  After 7pm go to [email or web address] - password [email or web address]  And look for the night coverage person covering for me after hours  [facility] Office  [phone]   **Disclaimer: This note may have been dictated with voice recognition software. Similar sounding words can inadvertently be transcribed and this note may contain transcription errors which may not have been corrected upon publication of note.**

## 2014-05-01 NOTE — Progress Notes (Signed)
[facility] Progress Note  Patient Name: [name] Date of Encounter: 05/01/2014, 7:45 AM    Subjective  Says less pain   Objective    Physical Exam: Filed Vitals:   05/01/14 0450  BP: 131/69  Pulse: 72  Temp: 99.6 F (37.6 C)  Resp: 18   General: NAD Abdomen: obese, mod tender epigastrium     Labs:  Recent Labs  04/30/14 0950 05/01/14 0600  NA 140 137  K 3.7 4.0  CL 104 105  CO2 23 24  GLUCOSE 87 97  BUN 16 14  CREATININE 0.94 0.83  CALCIUM 8.9 8.9    Recent Labs  04/30/14 0950 05/01/14 0600  AST 74* 40*  ALT 97* 60*  ALKPHOS 156* 127*  BILITOT 3.2* 1.3*  PROT 6.1 5.8*  ALBUMIN 3.0* 2.7*    Recent Labs  04/29/14 2325 05/01/14 0600  LIPASE 1406* 71*    Recent Labs  04/29/14 2325 05/01/14 0600  WBC 9.1 9.6  HGB 13.2 13.0  HCT 39.1 39.2  MCV 86.5 88.3  PLT 191 175     Assessment and Plan  Pancreatitis with dilated bile duct ([facility] CT and [place]) Possible cholecystitis  LFTS down today Reports less pain  Will get MRCP  Gatha Mayer, MD, [facility] [phone] (pager) 05/01/2014 7:47 AM

## 2014-05-02 DIAGNOSIS — K560 Paralytic ileus: Secondary | ICD-10-CM

## 2014-05-02 LAB — CBC
HEMATOCRIT: 35.6 % — AB (ref 39.0–52.0)
HEMOGLOBIN: 12 g/dL — AB (ref 13.0–17.0)
MCH: 28.8 pg (ref 26.0–34.0)
MCHC: 33.7 g/dL (ref 30.0–36.0)
MCV: 85.6 fL (ref 78.0–100.0)
Platelets: 197 10*3/uL (ref 150–400)
RBC: 4.16 MIL/uL — ABNORMAL LOW (ref 4.22–5.81)
RDW: 14.1 % (ref 11.5–15.5)
WBC: 8.1 10*3/uL (ref 4.0–10.5)

## 2014-05-02 LAB — MAGNESIUM: Magnesium: 1.7 mg/dL (ref 1.5–2.5)

## 2014-05-02 LAB — COMPREHENSIVE METABOLIC PANEL
ALT: 42 U/L (ref 0–53)
AST: 28 U/L (ref 0–37)
Albumin: 2.5 g/dL — ABNORMAL LOW (ref 3.5–5.2)
Alkaline Phosphatase: 112 U/L (ref 39–117)
Anion gap: 12 (ref 5–15)
BUN: 10 mg/dL (ref 6–23)
CALCIUM: 8.9 mg/dL (ref 8.4–10.5)
CO2: 21 mEq/L (ref 19–32)
Chloride: 105 mEq/L (ref 96–112)
Creatinine, Ser: 0.66 mg/dL (ref 0.50–1.35)
GFR calc non Af Amer: >90 mL/min (ref >90)
GLUCOSE: 112 mg/dL — AB (ref 70–99)
Potassium: 3.5 mEq/L — ABNORMAL LOW (ref 3.7–5.3)
Sodium: 138 mEq/L (ref 137–147)
TOTAL PROTEIN: 6 g/dL (ref 6.0–8.3)
Total Bilirubin: 0.9 mg/dL (ref 0.3–1.2)

## 2014-05-02 LAB — SURGICAL PCR SCREEN
MRSA, PCR: NEGATIVE
STAPHYLOCOCCUS AUREUS: NEGATIVE

## 2014-05-02 MED ORDER — BISACODYL 10 MG RE SUPP
10.0000 mg | Freq: Once | RECTAL | Status: AC
  Administered 2014-05-02: 10 mg via RECTAL
  Filled 2014-05-02: qty 1

## 2014-05-02 NOTE — Progress Notes (Signed)
Patient Demographics  [name], is a 73 y.o. male, DOB - July 13, 1941, KCL:[id number]  Admit date - 04/29/2014   Admitting Physician Shanda Howells, MD  Outpatient Primary MD for the patient is VYAS,DHRUV B., MD  LOS - 3   No chief complaint on file.       Subjective:   [name] today has, No headache, No chest pain, ++ epigastric abdominal pain - No Nausea, No new weakness tingling or numbness, No Cough - SOB.    Assessment & Plan    1. Gastric abdominal pain with findings consistent of CBD dilation, gallbladder wall thickening, intrahepatic biliary dilation and evidence of pancreatitis on CT scan at [facility]. Patient was at [facility] for the last 3 days prior to admission and was due to undergo ERCP but family chose to get him transferred here.    Workup suggested gallstone acute pancreatitis, GI surgery following an, continue empiric Unasyn and IV fluids, post MRCP with no CBD stone, due for laparoscopic cholecystectomy on 05/03/2014.    2. CAD with RCA drug-eluting stent placed in December of 2014. Plavix - ASA , cardiology on board and following, resume antiplatelets as soon as stable from GI and surgery standpoint. Perioperative Coreg continued.    3. Dyslipidemia - holding his statin due to pancreatitis n.p.o. status.    4. Hypertension. Oral Norvasc and Coreg onboard. ARB held for now.    5. GERD. IV PPI.    6.  H/O laryngeal cancer, treated with radiation therapy - outpatient followup no acute issues.       Code Status: Full  Family Communication: Daughter  Disposition Plan: TBD   Procedures     Consults  GI, CCS   Medications  Scheduled Meds: . amLODipine  5 mg Oral Daily  . ampicillin-sulbactam (UNASYN) IV  3 g Intravenous Q6H  .  antiseptic oral rinse  7 mL Mouth Rinse BID  . bisacodyl  10 mg Rectal Once  . carvedilol  3.125 mg Oral BID WC  . chlorhexidine  15 mL Mouth Rinse BID  . heparin  5,000 Units Subcutaneous 3 times per day  . pantoprazole  40 mg Oral Q0600   Continuous Infusions: . dextrose 5 % and 0.9 % NaCl with KCl 20 mEq/L 75 mL/hr at 05/02/14 0813   PRN Meds:.acetaminophen, fluticasone, HYDROcodone-acetaminophen, LORazepam, morphine injection  DVT Prophylaxis   Heparin    Lab Results  Component Value Date   PLT 197 05/02/2014    Antibiotics     Anti-infectives   Start     Dose/Rate Route Frequency Ordered Stop   04/30/14 1500  Ampicillin-Sulbactam (UNASYN) 3 g in sodium chloride 0.9 % 100 mL IVPB     3 g 100 mL/hr over 60 Minutes Intravenous Every 6 hours 04/30/14 1424     04/29/14 2300  piperacillin-tazobactam (ZOSYN) IVPB 3.375 g  Status:  Discontinued     3.375 g 12.5 mL/hr over 240 Minutes Intravenous 3 times per day 04/29/14 2231 04/30/14 1251   04/29/14 2300  vancomycin (VANCOCIN) IVPB 1000 mg/200 mL premix  Status:  Discontinued     1,000 mg 200 mL/hr over 60 Minutes Intravenous Every 12 hours 04/29/14 2231 04/30/14 1301   04/29/14 2245  ampicillin-sulbactam (UNASYN) 1.5  g in sodium chloride 0.9 % 50 mL IVPB  Status:  Discontinued     1.5 g 100 mL/hr over 30 Minutes Intravenous Every 6 hours 04/29/14 2202 04/29/14 2214          Objective:   Filed Vitals:   05/02/14 0050 05/02/14 0608 05/02/14 0810 05/02/14 0811  BP:  157/71 149/68   Pulse:  68 74   Temp:  98.2 F (36.8 C)    TempSrc:  Oral    Resp:  18    Height:      Weight:      SpO2: 92% 93%  94%    Wt Readings from Last 3 Encounters:  04/29/14 96.026 kg (211 lb 11.2 oz)  02/12/14 96.616 kg (213 lb)  10/13/13 98.884 kg (218 lb)     Intake/Output Summary (Last 24 hours) at 05/02/14 0905 Last data filed at 05/02/14 0737  Gross per 24 hour  Intake 1712.08 ml  Output    710 ml  Net 1002.08 ml      Physical Exam  Awake Alert, Oriented X 3, No new F.N deficits, Normal affect [place].AT,PERRAL Supple Neck,No JVD, No cervical lymphadenopathy appriciated.  Symmetrical Chest wall movement, Good air movement bilaterally, CTAB RRR,No Gallops,Rubs or new Murmurs, No Parasternal Heave +ve B.Sounds, Abd Soft, + epigastric tenderness, No organomegaly appriciated, No rebound - guarding or rigidity. No Cyanosis, Clubbing or edema, No new Rash or bruise      Data Review   Micro Results Recent Results (from the past 240 hour(s))  CULTURE, BLOOD (ROUTINE X 2)     Status: None   Collection Time    04/29/14 11:25 PM      Result Value Ref Range Status   Specimen Description BLOOD RIGHT ANTECUBITAL   Final   Special Requests     Final   Value: BOTTLES DRAWN AEROBIC AND ANAEROBIC AER 10CC ANA 5CC   Culture  Setup Time     Final   Value: 04/30/2014 03:39     Performed at [facility]   Culture     Final   Value:        BLOOD CULTURE RECEIVED NO GROWTH TO DATE CULTURE WILL BE HELD FOR 5 DAYS BEFORE ISSUING A FINAL NEGATIVE REPORT     Performed at [facility]   Report Status PENDING   Incomplete  CULTURE, BLOOD (ROUTINE X 2)     Status: None   Collection Time    04/29/14 11:38 PM      Result Value Ref Range Status   Specimen Description BLOOD RIGHT HAND   Final   Special Requests     Final   Value: BOTTLES DRAWN AEROBIC AND ANAEROBIC AER 10CC ANA [place]   Culture  Setup Time     Final   Value: 04/30/2014 03:39     Performed at [facility]   Culture     Final   Value:        BLOOD CULTURE RECEIVED NO GROWTH TO DATE CULTURE WILL BE HELD FOR 5 DAYS BEFORE ISSUING A FINAL NEGATIVE REPORT     Performed at [facility]   Report Status PENDING   Incomplete    Radiology Reports No results found.  CBC  Recent Labs Lab 04/29/14 2325 05/01/14 0600 05/02/14 0445  WBC 9.1 9.6 8.1  HGB 13.2 13.0 12.0*  HCT 39.1 39.2 35.6*  PLT 191 175 197  MCV 86.5 88.3  85.6  MCH 29.2 29.3 28.8  MCHC 33.8 33.2 33.7  RDW 14.2 14.2 14.1    Chemistries   Recent Labs Lab 04/29/14 2325 04/30/14 0950 05/01/14 0600  NA  --  140 137  K  --  3.7 4.0  CL  --  104 105  CO2  --  23 24  GLUCOSE  --  87 97  BUN  --  16 14  CREATININE 0.89 0.94 0.83  CALCIUM  --  8.9 8.9  AST  --  74* 40*  ALT  --  97* 60*  ALKPHOS  --  156* 127*  BILITOT  --  3.2* 1.3*   ------------------------------------------------------------------------------------------------------------------ estimated creatinine clearance is 87.7 ml/min (by C-G formula based on Cr of 0.83). ------------------------------------------------------------------------------------------------------------------ No results found for this basename: HGBA1C,  in the last 72 hours ------------------------------------------------------------------------------------------------------------------ No results found for this basename: CHOL, HDL, LDLCALC, TRIG, CHOLHDL, LDLDIRECT,  in the last 72 hours ------------------------------------------------------------------------------------------------------------------ No results found for this basename: TSH, T4TOTAL, FREET3, T3FREE, THYROIDAB,  in the last 72 hours ------------------------------------------------------------------------------------------------------------------ No results found for this basename: VITAMINB12, FOLATE, FERRITIN, TIBC, IRON, RETICCTPCT,  in the last 72 hours  Coagulation profile  Recent Labs Lab 04/30/14 0950  INR 1.30    No results found for this basename: DDIMER,  in the last 72 hours  Cardiac Enzymes No results found for this basename: CK, CKMB, TROPONINI, MYOGLOBIN,  in the last 168 hours ------------------------------------------------------------------------------------------------------------------ No components found with this basename: POCBNP,      Time Spent in minutes   35   SINGH,PRASHANT K M.D on 05/02/2014 at  9:05 AM  Between 7am to 7pm - Pager - [phone]  After 7pm go to [email or web address] - password [email or web address]  And look for the night coverage person covering for me after hours  [facility] Office  [phone]   **Disclaimer: This note may have been dictated with voice recognition software. Similar sounding words can inadvertently be transcribed and this note may contain transcription errors which may not have been corrected upon publication of note.**

## 2014-05-02 NOTE — Progress Notes (Signed)
Seen, agree with above.  Gallstone Pancreatitis NPO Hopefully pancreatitis resolves and can have GB out in next 1-2 days.

## 2014-05-02 NOTE — Progress Notes (Signed)
Patient refused CPAP, wears PRN at home.  Encouraged to call Respiratory Therapist if desired.

## 2014-05-02 NOTE — Progress Notes (Signed)
[facility] Progress Note     Subjective: Pt's pain improved some, but still quite tender.  No N/V since admission.  Notes continued abdominal distension.  Wife thinks he's taking too much pain medication and that he's taking it to "prevent pain" and not taking it as needed.  Ambulating some.  Daughter and wife at bedside.    Objective: Vital signs in last 24 hours: Temp:  [98.1 F (36.7 C)-98.4 F (36.9 C)] 98.2 F (36.8 C) (08/02 0608) Pulse Rate:  [64-71] 68 (08/02 0608) Resp:  [18] 18 (08/02 0608) BP: (127-157)/(63-71) 157/71 mmHg (08/02 0608) SpO2:  [87 %-93 %] 93 % (08/02 0608) Last BM Date: 04/28/14  Intake/Output from previous day: 08/01 0701 - 08/02 0700 In: 1510 [P.O.:60; I.V.:1250; IV Piggyback:200] Out: 710 [Urine:710] Intake/Output this shift: Total I/O In: 202.1 [I.V.:202.1] Out: -   PE:  Gen:  Alert, NAD, pleasant Abd: Soft, distended, tender in upper abdomen, +BS, no HSM   Lab Results:   Recent Labs  05/01/14 0600 05/02/14 0445  WBC 9.6 8.1  HGB 13.0 12.0*  HCT 39.2 35.6*  PLT 175 197   BMET  Recent Labs  04/30/14 0950 05/01/14 0600  NA 140 137  K 3.7 4.0  CL 104 105  CO2 23 24  GLUCOSE 87 97  BUN 16 14  CREATININE 0.94 0.83  CALCIUM 8.9 8.9   PT/INR  Recent Labs  04/30/14 0950  LABPROT 16.2*  INR 1.30   CMP     Component Value Date/Time   NA 137 05/01/2014 0600   K 4.0 05/01/2014 0600   CL 105 05/01/2014 0600   CO2 24 05/01/2014 0600   GLUCOSE 97 05/01/2014 0600   BUN 14 05/01/2014 0600   CREATININE 0.83 05/01/2014 0600   CALCIUM 8.9 05/01/2014 0600   PROT 5.8* 05/01/2014 0600   ALBUMIN 2.7* 05/01/2014 0600   AST 40* 05/01/2014 0600   ALT 60* 05/01/2014 0600   ALKPHOS 127* 05/01/2014 0600   BILITOT 1.3* 05/01/2014 0600   GFRNONAA 85* 05/01/2014 0600   GFRAA >90 05/01/2014 0600   Lipase     Component Value Date/Time   LIPASE 71* 05/01/2014 0600       Studies/Results: Mr 3d Recon At Scanner  05/01/2014   CLINICAL DATA:   Dilated CBD on CT and ultrasound, pancreatitis  EXAM: MRI ABDOMEN WITHOUT AND WITH CONTRAST (INCLUDING MRCP)  TECHNIQUE: Multiplanar multisequence MR imaging of the abdomen was performed both before and after the administration of intravenous contrast. Heavily T2-weighted images of the biliary and pancreatic ducts were obtained, and three-dimensional MRCP images were rendered by post processing.  CONTRAST:  60mL MULTIHANCE GADOBENATE DIMEGLUMINE 529 MG/ML IV SOLN  COMPARISON:  [facility] abdominal ultrasound dated 04/29/2014 and CT abdomen pelvis dated 04/28/2014.  FINDINGS: Motion degraded images.  Small to moderate right and small left pleural effusions. Associated bilateral lower lobe atelectasis.  Liver is within normal limits. No suspicious/enhancing hepatic lesions. No hepatic steatosis.  Spleen and adrenal glands are within normal limits.  Peripancreatic inflammatory changes/fluid along the body/tail (series 10/ image 17), compatible with known acute pancreatitis. No associated dilatation of the main pancreatic duct. No peripancreatic fluid collection/pseudocyst.  Suspected sludge in the gallbladder neck (series 4/image 24). No gallstones, gallbladder wall thickening, or pericholecystic fluid.  Proximal common duct is dilated, measuring 11 mm (series 4/ image 22). Fusiform dilatation of the mid common duct, measuring up to 16 mm (series 4/image 22), suggesting a type 1 choledochal cyst. Smooth tapering  of the distal common duct, measuring 8 mm (series 4/ image 23). No evidence of choledocholithiasis.  Tiny left lower pole renal cyst (series 6/ image 40). Kidneys are otherwise within normal limits. No hydronephrosis.  No suspicious abdominal lymphadenopathy.  No focal osseous lesions.  IMPRESSION: Motion degraded images.  Acute pancreatitis, without evidence of complication.  Gallbladder sludge, without associated inflammatory changes.  Dilated common duct with possible type 1 choledochal cyst. No evidence of  choledocholithiasis.   Electronically Signed   By: Julian Hy M.D.   On: 05/01/2014 17:19   Mr Abd W/wo Cm/mrcp  05/01/2014   CLINICAL DATA:  Dilated CBD on CT and ultrasound, pancreatitis  EXAM: MRI ABDOMEN WITHOUT AND WITH CONTRAST (INCLUDING MRCP)  TECHNIQUE: Multiplanar multisequence MR imaging of the abdomen was performed both before and after the administration of intravenous contrast. Heavily T2-weighted images of the biliary and pancreatic ducts were obtained, and three-dimensional MRCP images were rendered by post processing.  CONTRAST:  37mL MULTIHANCE GADOBENATE DIMEGLUMINE 529 MG/ML IV SOLN  COMPARISON:  [facility] abdominal ultrasound dated 04/29/2014 and CT abdomen pelvis dated 04/28/2014.  FINDINGS: Motion degraded images.  Small to moderate right and small left pleural effusions. Associated bilateral lower lobe atelectasis.  Liver is within normal limits. No suspicious/enhancing hepatic lesions. No hepatic steatosis.  Spleen and adrenal glands are within normal limits.  Peripancreatic inflammatory changes/fluid along the body/tail (series 10/ image 17), compatible with known acute pancreatitis. No associated dilatation of the main pancreatic duct. No peripancreatic fluid collection/pseudocyst.  Suspected sludge in the gallbladder neck (series 4/image 24). No gallstones, gallbladder wall thickening, or pericholecystic fluid.  Proximal common duct is dilated, measuring 11 mm (series 4/ image 22). Fusiform dilatation of the mid common duct, measuring up to 16 mm (series 4/image 22), suggesting a type 1 choledochal cyst. Smooth tapering of the distal common duct, measuring 8 mm (series 4/ image 23). No evidence of choledocholithiasis.  Tiny left lower pole renal cyst (series 6/ image 40). Kidneys are otherwise within normal limits. No hydronephrosis.  No suspicious abdominal lymphadenopathy.  No focal osseous lesions.  IMPRESSION: Motion degraded images.  Acute pancreatitis, without evidence of  complication.  Gallbladder sludge, without associated inflammatory changes.  Dilated common duct with possible type 1 choledochal cyst. No evidence of choledocholithiasis.   Electronically Signed   By: Julian Hy M.D.   On: 05/01/2014 17:19    Anti-infectives: Anti-infectives   Start     Dose/Rate Route Frequency Ordered Stop   04/30/14 1500  Ampicillin-Sulbactam (UNASYN) 3 g in sodium chloride 0.9 % 100 mL IVPB     3 g 100 mL/hr over 60 Minutes Intravenous Every 6 hours 04/30/14 1424     04/29/14 2300  piperacillin-tazobactam (ZOSYN) IVPB 3.375 g  Status:  Discontinued     3.375 g 12.5 mL/hr over 240 Minutes Intravenous 3 times per day 04/29/14 2231 04/30/14 1251   04/29/14 2300  vancomycin (VANCOCIN) IVPB 1000 mg/200 mL premix  Status:  Discontinued     1,000 mg 200 mL/hr over 60 Minutes Intravenous Every 12 hours 04/29/14 2231 04/30/14 1301   04/29/14 2245  ampicillin-sulbactam (UNASYN) 1.5 g in sodium chloride 0.9 % 50 mL IVPB  Status:  Discontinued     1.5 g 100 mL/hr over 30 Minutes Intravenous Every 6 hours 04/29/14 2202 04/29/14 2214       Assessment/Plan Gallstone pancreatitis  Cholecystitis Ileus  Plan:  1. With how tender the patient is and his age would continue Unasyn to  treat prophylactically for cholecystitis. 2. NPO (no swallowing ice chips) until pain resolves (any PO may cause pancreas to be more inflamed), bowel rest, IVF, pain control, antiemetics  3. SCD's and heparin for DVT proph  4. Ambulate and IS  5. Await improvement in lipase and pain prior to proceeding with surgical interventions. MRCP shows no stones.   DES complicates things with his surgery, and his plavix use.   6. Will plan on lap chole Monday if labs/pain improved. 7. Add dulcolax to see if it will help with his abdominal distension    LOS: 3 days    [name], [name] 05/02/2014, 8:03 AM Pager: [phone]

## 2014-05-02 NOTE — Progress Notes (Addendum)
[facility] Progress Note  Patient Name: [name] Date of Encounter: 05/02/2014, 12:53 PM    Subjective  Still having abdominal pain   Objective    Physical Exam: Filed Vitals:   05/02/14 0810  BP: 149/68  Pulse: 74  Temp:   Resp:    General: NAD Abdomen:  Obese, mildly tender epigastrium Psych: appropriate    Labs:  Recent Labs  2014/05/25 0600 05/02/14 0830  NA 137 138  K 4.0 3.5*  CL 105 105  CO2 24 21  GLUCOSE 97 112*  BUN 14 10  CREATININE 0.83 0.66  CALCIUM 8.9 8.9  MG  --  1.7    Recent Labs  05/25/14 0600 05/02/14 0830  AST 40* 28  ALT 60* 42  ALKPHOS 127* 112  BILITOT 1.3* 0.9  PROT 5.8* 6.0  ALBUMIN 2.7* 2.5*    Recent Labs  04/29/14 2325 05-25-2014 0600  LIPASE 1406* 71*    Recent Labs  05-25-2014 0600 05/02/14 0445  WBC 9.6 8.1  HGB 13.0 12.0*  HCT 39.2 35.6*  MCV 88.3 85.6  PLT 175 197    Radiology/Studies:    Mr Abd W/wo Cm/mrcp  2014-05-25   CLINICAL DATA:  Dilated CBD on CT and ultrasound, pancreatitis  EXAM: MRI ABDOMEN WITHOUT AND WITH CONTRAST (INCLUDING MRCP)  TECHNIQUE: Multiplanar multisequence MR imaging of the abdomen was performed both before and after the administration of intravenous contrast. Heavily T2-weighted images of the biliary and pancreatic ducts were obtained, and three-dimensional MRCP images were rendered by post processing.  CONTRAST:  64mL MULTIHANCE GADOBENATE DIMEGLUMINE 529 MG/ML IV SOLN  COMPARISON:  [facility] abdominal ultrasound dated 04/29/2014 and CT abdomen pelvis dated 04/28/2014.  FINDINGS: Motion degraded images.  Small to moderate right and small left pleural effusions. Associated bilateral lower lobe atelectasis.  Liver is within normal limits. No suspicious/enhancing hepatic lesions. No hepatic steatosis.  Spleen and adrenal glands are within normal limits.  Peripancreatic inflammatory changes/fluid along the body/tail (series 10/ image 17), compatible with known acute  pancreatitis. No associated dilatation of the main pancreatic duct. No peripancreatic fluid collection/pseudocyst.  Suspected sludge in the gallbladder neck (series 4/image 24). No gallstones, gallbladder wall thickening, or pericholecystic fluid.  Proximal common duct is dilated, measuring 11 mm (series 4/ image 22). Fusiform dilatation of the mid common duct, measuring up to 16 mm (series 4/image 22), suggesting a type 1 choledochal cyst. Smooth tapering of the distal common duct, measuring 8 mm (series 4/ image 23). No evidence of choledocholithiasis.  Tiny left lower pole renal cyst (series 6/ image 40). Kidneys are otherwise within normal limits. No hydronephrosis.  No suspicious abdominal lymphadenopathy.  No focal osseous lesions.  IMPRESSION: Motion degraded images.  Acute pancreatitis, without evidence of complication.  Gallbladder sludge, without associated inflammatory changes.  Dilated common duct with possible type 1 choledochal cyst. No evidence of choledocholithiasis.   Electronically Signed   By: Julian Hy M.D.   On: May 25, 2014 17:19     Assessment and Plan   1) Pancreatitis - probably biliary 2) Dilated CBD and ? Of choledochal cyst - type I - but had 2.5 mm CBD on [place] in June 2015  1) Will check with radiology re: CBD findings 2) If this is a choledochal cyst then needs it resected given cancer potential- explained to patient/family 3) Could need EUS/ERCP to help understand things better - #'s and patient improving so not urgent  Gatha Mayer, MD, [facility] [phone] (pager)  05/02/2014 12:57 PM    I reviewed [place] from June with radiologist - bile duct was enlarged then they think but misreported - was probably 10 mm in area scanned so this could fit better with choledochal cyst.  Gatha Mayer, MD, [facility] [phone] (pager) 05/02/2014 7:23 PM

## 2014-05-02 NOTE — Progress Notes (Signed)
Pt has o2 sat of 87% on room air. Pt is resting comfortably in bed and no s/s of distress noted. Pt is encouraged for deep breathing. As saturation was still in 87-88%, pt is placed back on 2 litre of oxygen through [place]. O2 sat is 92% on 3LPM . Will cont to monitor. Family at bedside.RT  [place].

## 2014-05-03 ENCOUNTER — Encounter (HOSPITAL_COMMUNITY)
Admission: RE | Disposition: A | Discharge status: Home / Self Care | Payer: Self-pay | Source: Other Acute Inpatient Hospital | Attending: Internal Medicine

## 2014-05-03 ENCOUNTER — Encounter (HOSPITAL_COMMUNITY): Payer: Self-pay | Admitting: Anesthesiology

## 2014-05-03 ENCOUNTER — Inpatient Hospital Stay (HOSPITAL_COMMUNITY): Payer: Medicare Other

## 2014-05-03 ENCOUNTER — Inpatient Hospital Stay (HOSPITAL_COMMUNITY): Payer: Medicare Other | Admitting: Anesthesiology

## 2014-05-03 ENCOUNTER — Encounter (HOSPITAL_COMMUNITY): Payer: Medicare Other | Admitting: Anesthesiology

## 2014-05-03 DIAGNOSIS — K811 Chronic cholecystitis: Secondary | ICD-10-CM

## 2014-05-03 HISTORY — PX: CHOLECYSTECTOMY: SHX55

## 2014-05-03 LAB — COMPREHENSIVE METABOLIC PANEL
ALBUMIN: 2.5 g/dL — AB (ref 3.5–5.2)
ALK PHOS: 118 U/L — AB (ref 39–117)
ALT: 39 U/L (ref 0–53)
ANION GAP: 12 (ref 5–15)
AST: 46 U/L — ABNORMAL HIGH (ref 0–37)
BUN: 10 mg/dL (ref 6–23)
CO2: 23 mEq/L (ref 19–32)
Calcium: 9.2 mg/dL (ref 8.4–10.5)
Chloride: 106 mEq/L (ref 96–112)
Creatinine, Ser: 0.8 mg/dL (ref 0.50–1.35)
GFR calc Af Amer: >90 mL/min (ref >90)
GFR calc non Af Amer: 86 mL/min — ABNORMAL LOW (ref >90)
Glucose, Bld: 107 mg/dL — ABNORMAL HIGH (ref 70–99)
POTASSIUM: 3.6 meq/L — AB (ref 3.7–5.3)
SODIUM: 141 meq/L (ref 137–147)
TOTAL PROTEIN: 6.2 g/dL (ref 6.0–8.3)
Total Bilirubin: 0.9 mg/dL (ref 0.3–1.2)

## 2014-05-03 LAB — CBC
HEMATOCRIT: 36.5 % — AB (ref 39.0–52.0)
Hemoglobin: 12.4 g/dL — ABNORMAL LOW (ref 13.0–17.0)
MCH: 28.7 pg (ref 26.0–34.0)
MCHC: 34 g/dL (ref 30.0–36.0)
MCV: 84.5 fL (ref 78.0–100.0)
Platelets: 216 10*3/uL (ref 150–400)
RBC: 4.32 MIL/uL (ref 4.22–5.81)
RDW: 14.1 % (ref 11.5–15.5)
WBC: 7.6 10*3/uL (ref 4.0–10.5)

## 2014-05-03 LAB — LIPASE, BLOOD: Lipase: 25 U/L (ref 11–59)

## 2014-05-03 LAB — MAGNESIUM: Magnesium: 1.9 mg/dL (ref 1.5–2.5)

## 2014-05-03 SURGERY — LAPAROSCOPIC CHOLECYSTECTOMY WITH INTRAOPERATIVE CHOLANGIOGRAM
Anesthesia: General | Site: Abdomen

## 2014-05-03 MED ORDER — GLYCOPYRROLATE 0.2 MG/ML IJ SOLN
INTRAMUSCULAR | Status: AC
  Filled 2014-05-03: qty 3

## 2014-05-03 MED ORDER — BUPIVACAINE-EPINEPHRINE 0.25% -1:200000 IJ SOLN
INTRAMUSCULAR | Status: DC | PRN
  Administered 2014-05-03: 10 mL

## 2014-05-03 MED ORDER — PROPOFOL 10 MG/ML IV BOLUS
INTRAVENOUS | Status: DC | PRN
  Administered 2014-05-03: 130 mg via INTRAVENOUS

## 2014-05-03 MED ORDER — ROCURONIUM BROMIDE 50 MG/5ML IV SOLN
INTRAVENOUS | Status: AC
  Filled 2014-05-03: qty 1

## 2014-05-03 MED ORDER — ONDANSETRON HCL 4 MG/2ML IJ SOLN
INTRAMUSCULAR | Status: AC
  Filled 2014-05-03: qty 2

## 2014-05-03 MED ORDER — OXYCODONE HCL 5 MG PO TABS: 5.0000 mg | ORAL_TABLET | Freq: Once | ORAL | Status: AC | PRN

## 2014-05-03 MED ORDER — ONDANSETRON HCL 4 MG/2ML IJ SOLN
4.0000 mg | Freq: Once | INTRAMUSCULAR | Status: AC | PRN
  Administered 2014-05-03: 4 mg via INTRAVENOUS

## 2014-05-03 MED ORDER — PHENYLEPHRINE HCL 10 MG/ML IJ SOLN
INTRAMUSCULAR | Status: DC | PRN
  Administered 2014-05-03 (×2): 80 ug via INTRAVENOUS
  Administered 2014-05-03: 40 ug via INTRAVENOUS
  Administered 2014-05-03: 80 ug via INTRAVENOUS
  Administered 2014-05-03: 40 ug via INTRAVENOUS
  Administered 2014-05-03: 80 ug via INTRAVENOUS

## 2014-05-03 MED ORDER — ONDANSETRON HCL 4 MG/2ML IJ SOLN
INTRAMUSCULAR | Status: DC | PRN
  Administered 2014-05-03: 4 mg via INTRAVENOUS

## 2014-05-03 MED ORDER — HYDROMORPHONE HCL PF 1 MG/ML IJ SOLN
0.2500 mg | INTRAMUSCULAR | Status: DC | PRN
  Administered 2014-05-03 (×2): 0.5 mg via INTRAVENOUS

## 2014-05-03 MED ORDER — SODIUM CHLORIDE 0.9 % IV SOLN
3.0000 g | Freq: Four times a day (QID) | INTRAVENOUS | Status: AC
  Administered 2014-05-03: 3 g via INTRAVENOUS
  Filled 2014-05-03: qty 3

## 2014-05-03 MED ORDER — ONDANSETRON HCL 4 MG/2ML IJ SOLN
4.0000 mg | Freq: Four times a day (QID) | INTRAMUSCULAR | Status: DC | PRN
  Administered 2014-05-03 – 2014-05-04 (×3): 4 mg via INTRAVENOUS
  Filled 2014-05-03 (×3): qty 2

## 2014-05-03 MED ORDER — LIDOCAINE HCL (CARDIAC) 20 MG/ML IV SOLN
INTRAVENOUS | Status: DC | PRN
  Administered 2014-05-03: 50 mg via INTRAVENOUS

## 2014-05-03 MED ORDER — BISACODYL 10 MG RE SUPP
10.0000 mg | Freq: Once | RECTAL | Status: AC
  Administered 2014-05-03: 10 mg via RECTAL
  Filled 2014-05-03: qty 1

## 2014-05-03 MED ORDER — POTASSIUM CHLORIDE IN NACL 40-0.9 MEQ/L-% IV SOLN
INTRAVENOUS | Status: AC
  Administered 2014-05-03: 75 mL/h via INTRAVENOUS
  Filled 2014-05-03: qty 1000

## 2014-05-03 MED ORDER — DEXTROSE 5 % IV SOLN
10.0000 mg | INTRAVENOUS | Status: DC | PRN
  Administered 2014-05-03: 15 ug/min via INTRAVENOUS

## 2014-05-03 MED ORDER — OXYCODONE HCL 5 MG/5ML PO SOLN: 5.0000 mg | Freq: Once | ORAL | Status: AC | PRN

## 2014-05-03 MED ORDER — SODIUM CHLORIDE 0.9 % IR SOLN
Status: DC | PRN
  Administered 2014-05-03: 1000 mL

## 2014-05-03 MED ORDER — ROCURONIUM BROMIDE 100 MG/10ML IV SOLN
INTRAVENOUS | Status: DC | PRN
  Administered 2014-05-03: 40 mg via INTRAVENOUS
  Administered 2014-05-03 (×2): 10 mg via INTRAVENOUS

## 2014-05-03 MED ORDER — POTASSIUM CHLORIDE 2 MEQ/ML IV SOLN
INTRAVENOUS | Status: DC
  Administered 2014-05-03: 09:00:00 via INTRAVENOUS
  Filled 2014-05-03: qty 1000

## 2014-05-03 MED ORDER — POTASSIUM CHLORIDE 20 MEQ/15ML (10%) PO LIQD
40.0000 meq | Freq: Once | ORAL | Status: DC
  Filled 2014-05-03: qty 30

## 2014-05-03 MED ORDER — BUPIVACAINE-EPINEPHRINE (PF) 0.25% -1:200000 IJ SOLN
INTRAMUSCULAR | Status: AC
  Filled 2014-05-03: qty 30

## 2014-05-03 MED ORDER — MORPHINE SULFATE 2 MG/ML IJ SOLN
2.0000 mg | INTRAMUSCULAR | Status: DC | PRN
  Administered 2014-05-03 – 2014-05-04 (×2): 2 mg via INTRAVENOUS
  Filled 2014-05-03 (×2): qty 1

## 2014-05-03 MED ORDER — HYDROMORPHONE HCL PF 1 MG/ML IJ SOLN
INTRAMUSCULAR | Status: AC
  Administered 2014-05-03: 0.5 mg via INTRAVENOUS
  Filled 2014-05-03: qty 1

## 2014-05-03 MED ORDER — SODIUM CHLORIDE 0.9 % IV SOLN
INTRAVENOUS | Status: DC | PRN
  Administered 2014-05-03: 17:00:00

## 2014-05-03 MED ORDER — ARTIFICIAL TEARS OP OINT
TOPICAL_OINTMENT | OPHTHALMIC | Status: AC
  Filled 2014-05-03: qty 3.5

## 2014-05-03 MED ORDER — PHENYLEPHRINE 40 MCG/ML (10ML) SYRINGE FOR IV PUSH (FOR BLOOD PRESSURE SUPPORT)
PREFILLED_SYRINGE | INTRAVENOUS | Status: AC
  Filled 2014-05-03: qty 10

## 2014-05-03 MED ORDER — NEOSTIGMINE METHYLSULFATE 10 MG/10ML IV SOLN
INTRAVENOUS | Status: DC | PRN
  Administered 2014-05-03: 4 mg via INTRAVENOUS

## 2014-05-03 MED ORDER — NEOSTIGMINE METHYLSULFATE 10 MG/10ML IV SOLN
INTRAVENOUS | Status: AC
  Filled 2014-05-03: qty 1

## 2014-05-03 MED ORDER — FENTANYL CITRATE 0.05 MG/ML IJ SOLN
INTRAMUSCULAR | Status: DC | PRN
  Administered 2014-05-03: 100 ug via INTRAVENOUS
  Administered 2014-05-03: 50 ug via INTRAVENOUS
  Administered 2014-05-03: 100 ug via INTRAVENOUS

## 2014-05-03 MED ORDER — MIDAZOLAM HCL 2 MG/2ML IJ SOLN
INTRAMUSCULAR | Status: AC
  Filled 2014-05-03: qty 2

## 2014-05-03 MED ORDER — CLOPIDOGREL BISULFATE 75 MG PO TABS
75.0000 mg | ORAL_TABLET | Freq: Every day | ORAL | Status: DC
  Administered 2014-05-03 – 2014-05-05 (×3): 75 mg via ORAL
  Filled 2014-05-03 (×4): qty 1

## 2014-05-03 MED ORDER — LACTATED RINGERS IV SOLN
INTRAVENOUS | Status: DC | PRN
  Administered 2014-05-03 (×2): via INTRAVENOUS

## 2014-05-03 MED ORDER — HEPARIN SODIUM (PORCINE) 5000 UNIT/ML IJ SOLN
5000.0000 [IU] | Freq: Three times a day (TID) | INTRAMUSCULAR | Status: DC
  Administered 2014-05-04: 5000 [IU] via SUBCUTANEOUS
  Filled 2014-05-03 (×6): qty 1

## 2014-05-03 MED ORDER — ARTIFICIAL TEARS OP OINT
TOPICAL_OINTMENT | OPHTHALMIC | Status: DC | PRN
  Administered 2014-05-03: 1 via OPHTHALMIC

## 2014-05-03 MED ORDER — PROPOFOL 10 MG/ML IV BOLUS
INTRAVENOUS | Status: AC
  Filled 2014-05-03: qty 20

## 2014-05-03 MED ORDER — SODIUM CHLORIDE 0.9 % IV SOLN
INTRAVENOUS | Status: DC | PRN
  Administered 2014-05-03: 16:00:00 via INTRAVENOUS

## 2014-05-03 MED ORDER — DEXAMETHASONE SODIUM PHOSPHATE 4 MG/ML IJ SOLN
INTRAMUSCULAR | Status: DC | PRN
  Administered 2014-05-03: 4 mg via INTRAVENOUS

## 2014-05-03 MED ORDER — LACTATED RINGERS IV SOLN
INTRAVENOUS | Status: DC
  Administered 2014-05-03: 15:00:00 via INTRAVENOUS

## 2014-05-03 MED ORDER — GLYCOPYRROLATE 0.2 MG/ML IJ SOLN
INTRAMUSCULAR | Status: DC | PRN
  Administered 2014-05-03: .6 mg via INTRAVENOUS

## 2014-05-03 MED ORDER — FENTANYL CITRATE 0.05 MG/ML IJ SOLN
INTRAMUSCULAR | Status: AC
Start: 2014-05-03 — End: 2014-05-03
  Filled 2014-05-03: qty 5

## 2014-05-03 MED ORDER — LIDOCAINE HCL (CARDIAC) 20 MG/ML IV SOLN
INTRAVENOUS | Status: AC
  Filled 2014-05-03: qty 5

## 2014-05-03 MED ORDER — 0.9 % SODIUM CHLORIDE (POUR BTL) OPTIME
TOPICAL | Status: DC | PRN
  Administered 2014-05-03: 1000 mL

## 2014-05-03 SURGICAL SUPPLY — 51 items
APPLIER CLIP 5 13 M/L LIGAMAX5 (MISCELLANEOUS) ×3
BANDAGE ADH SHEER 1  50/CT (GAUZE/BANDAGES/DRESSINGS) ×9 IMPLANT
BENZOIN TINCTURE PRP APPL 2/3 (GAUZE/BANDAGES/DRESSINGS) ×3 IMPLANT
BLADE SURG ROTATE 9660 (MISCELLANEOUS) ×3 IMPLANT
CANISTER SUCTION 2500CC (MISCELLANEOUS) ×3 IMPLANT
CHLORAPREP W/TINT 26ML (MISCELLANEOUS) ×3 IMPLANT
CLIP APPLIE 5 13 M/L LIGAMAX5 (MISCELLANEOUS) ×1 IMPLANT
CLOSURE WOUND 1/2 X4 (GAUZE/BANDAGES/DRESSINGS) ×1
COVER MAYO STAND STRL (DRAPES) ×3 IMPLANT
COVER SURGICAL LIGHT HANDLE (MISCELLANEOUS) ×3 IMPLANT
DECANTER SPIKE VIAL GLASS SM (MISCELLANEOUS) IMPLANT
DRAPE C-ARM 42X72 X-RAY (DRAPES) ×3 IMPLANT
DRAPE UTILITY 15X26 W/TAPE STR (DRAPE) ×6 IMPLANT
DRSG TEGADERM 4X4.75 (GAUZE/BANDAGES/DRESSINGS) ×3 IMPLANT
ELECT REM PT RETURN 9FT ADLT (ELECTROSURGICAL) ×3
ELECTRODE REM PT RTRN 9FT ADLT (ELECTROSURGICAL) ×1 IMPLANT
ENDOLOOP SUT PDS II  0 18 (SUTURE) ×4
ENDOLOOP SUT PDS II 0 18 (SUTURE) ×2 IMPLANT
GAUZE SPONGE 2X2 8PLY STRL LF (GAUZE/BANDAGES/DRESSINGS) ×1 IMPLANT
GLOVE BIO SURGEON STRL SZ7.5 (GLOVE) ×3 IMPLANT
GLOVE BIOGEL M STRL SZ7.5 (GLOVE) ×6 IMPLANT
GLOVE BIOGEL PI IND STRL 6.5 (GLOVE) ×2 IMPLANT
GLOVE BIOGEL PI IND STRL 7.0 (GLOVE) ×3 IMPLANT
GLOVE BIOGEL PI IND STRL 8 (GLOVE) ×1 IMPLANT
GLOVE BIOGEL PI INDICATOR 6.5 (GLOVE) ×4
GLOVE BIOGEL PI INDICATOR 7.0 (GLOVE) ×6
GLOVE BIOGEL PI INDICATOR 8 (GLOVE) ×2
GLOVE SURG SS PI 6.5 STRL IVOR (GLOVE) ×3 IMPLANT
GLOVE SURG SS PI 7.0 STRL IVOR (GLOVE) ×6 IMPLANT
GOWN STRL REUS W/ TWL LRG LVL3 (GOWN DISPOSABLE) ×5 IMPLANT
GOWN STRL REUS W/ TWL XL LVL3 (GOWN DISPOSABLE) ×1 IMPLANT
GOWN STRL REUS W/TWL LRG LVL3 (GOWN DISPOSABLE) ×10
GOWN STRL REUS W/TWL XL LVL3 (GOWN DISPOSABLE) ×2
KIT BASIN OR (CUSTOM PROCEDURE TRAY) ×3 IMPLANT
KIT ROOM TURNOVER OR (KITS) ×3 IMPLANT
NS IRRIG 1000ML POUR BTL (IV SOLUTION) ×3 IMPLANT
PAD ARMBOARD 7.5X6 YLW CONV (MISCELLANEOUS) ×3 IMPLANT
POUCH SPECIMEN RETRIEVAL 10MM (ENDOMECHANICALS) ×3 IMPLANT
SCISSORS LAP 5X35 DISP (ENDOMECHANICALS) ×3 IMPLANT
SET CHOLANGIOGRAPH 5 50 .035 (SET/KITS/TRAYS/PACK) ×3 IMPLANT
SET IRRIG TUBING LAPAROSCOPIC (IRRIGATION / IRRIGATOR) ×3 IMPLANT
SLEEVE ENDOPATH XCEL 5M (ENDOMECHANICALS) ×6 IMPLANT
SPECIMEN JAR SMALL (MISCELLANEOUS) ×3 IMPLANT
SPONGE GAUZE 2X2 STER 10/PKG (GAUZE/BANDAGES/DRESSINGS) ×2
STRIP CLOSURE SKIN 1/2X4 (GAUZE/BANDAGES/DRESSINGS) ×2 IMPLANT
SUT MNCRL AB 4-0 PS2 18 (SUTURE) ×3 IMPLANT
TOWEL OR 17X24 6PK STRL BLUE (TOWEL DISPOSABLE) IMPLANT
TOWEL OR 17X26 10 PK STRL BLUE (TOWEL DISPOSABLE) ×3 IMPLANT
TRAY LAPAROSCOPIC (CUSTOM PROCEDURE TRAY) ×3 IMPLANT
TROCAR XCEL BLUNT TIP 100MML (ENDOMECHANICALS) ×3 IMPLANT
TROCAR XCEL NON-BLD 5MMX100MML (ENDOMECHANICALS) ×3 IMPLANT

## 2014-05-03 NOTE — Transfer of Care (Signed)
Immediate Anesthesia Transfer of Care Note  Patient: [name]  Procedure(s) Performed: Procedure(s): LAPAROSCOPIC CHOLECYSTECTOMY WITH INTRAOPERATIVE CHOLANGIOGRAM (N/A)  Patient Location: PACU  Anesthesia Type:General  Level of Consciousness: awake, alert  and oriented  Airway & Oxygen Therapy: Patient Spontanous Breathing  Post-op Assessment: Report given to PACU RN  Post vital signs: Reviewed and stable  Complications: No apparent anesthesia complications

## 2014-05-03 NOTE — Anesthesia Procedure Notes (Signed)
Procedure Name: Intubation Date/Time: 05/03/2014 4:00 PM Performed by: Terrill Mohr Pre-anesthesia Checklist: Patient identified, Emergency Drugs available, Suction available and Patient being monitored Patient Re-evaluated:Patient Re-evaluated prior to inductionOxygen Delivery Method: Circle system utilized Preoxygenation: Pre-oxygenation with 100% oxygen Intubation Type: IV induction Ventilation: Mask ventilation without difficulty Laryngoscope Size: Mac and 4 Grade View: Grade II Tube type: Oral Tube size: 7.5 mm Number of attempts: 1 Airway Equipment and Method: Stylet Placement Confirmation: ETT inserted through vocal cords under direct vision,  breath sounds checked- equal and bilateral and positive ETCO2 Secured at: 21 (cm at upper gum) cm Tube secured with: Tape Dental Injury: Teeth and Oropharynx as per pre-operative assessment

## 2014-05-03 NOTE — Progress Notes (Signed)
Placed patient on auto titrate mode Max 18 Min 6 and .21% 02.  Patient is wearing full face mask and tolerating well.

## 2014-05-03 NOTE — Anesthesia Postprocedure Evaluation (Signed)
  Anesthesia Post-op Note  Patient: [name]  Procedure(s) Performed: Procedure(s): LAPAROSCOPIC CHOLECYSTECTOMY WITH INTRAOPERATIVE CHOLANGIOGRAM (N/A)  Patient Location: PACU  Anesthesia Type:General  Level of Consciousness: awake, alert  and oriented  Airway and Oxygen Therapy: Patient Spontanous Breathing and Patient connected to nasal cannula oxygen  Post-op Pain: mild  Post-op Assessment: Post-op Vital signs reviewed, Patient's Cardiovascular Status Stable, Respiratory Function Stable, Patent Airway and Pain level controlled  Post-op Vital Signs: stable  Last Vitals:  Filed Vitals:   05/03/14 1838  BP:   Pulse: 66  Temp: 37.2 C  Resp: 19    Complications: No apparent anesthesia complications

## 2014-05-03 NOTE — Progress Notes (Signed)
ANTIBIOTIC CONSULT NOTE - FOLLOW UP  Pharmacy Consult for pharmacy Indication: acute cholecystitis  No Known Allergies  Patient Measurements: Height: 5' 8" (172.7 cm) Weight: 211 lb 11.2 oz (96.026 kg) IBW/kg (Calculated) : 68.4  Vital Signs: Temp: 98.2 F (36.8 C) (08/03 0458) Temp src: Axillary (08/03 0458) BP: 135/58 mmHg (08/03 0957) Pulse Rate: 69 (08/03 0720) Intake/Output from previous day: 08/02 0701 - 08/03 0700 In: 1110.8 [I.V.:1010.8; IV Piggyback:100] Out: -  Intake/Output from this shift: Total I/O In: 286.7 [P.O.:240; I.V.:46.7] Out: -   Labs:  Recent Labs  05/01/14 0600 05/02/14 0445 05/02/14 0830 05/03/14 0607  WBC 9.6 8.1  --  7.6  HGB 13.0 12.0*  --  12.4*  PLT 175 197  --  216  CREATININE 0.83  --  0.66 0.80   Estimated Creatinine Clearance: 91 ml/min (by C-G formula based on Cr of 0.8). No results found for this basename: VANCOTROUGH, VANCOPEAK, VANCORANDOM, GENTTROUGH, GENTPEAK, GENTRANDOM, TOBRATROUGH, TOBRAPEAK, TOBRARND, AMIKACINPEAK, AMIKACINTROU, AMIKACIN,  in the last 72 hours   Microbiology: Recent Results (from the past 720 hour(s))  CULTURE, BLOOD (ROUTINE X 2)     Status: None   Collection Time    04/29/14 11:25 PM      Result Value Ref Range Status   Specimen Description BLOOD RIGHT ANTECUBITAL   Final   Special Requests     Final   Value: BOTTLES DRAWN AEROBIC AND ANAEROBIC AER 10CC ANA 5CC   Culture  Setup Time     Final   Value: 04/30/2014 03:39     Performed at [facility]   Culture     Final   Value:        BLOOD CULTURE RECEIVED NO GROWTH TO DATE CULTURE WILL BE HELD FOR 5 DAYS BEFORE ISSUING A FINAL NEGATIVE REPORT     Performed at [facility]   Report Status PENDING   Incomplete  CULTURE, BLOOD (ROUTINE X 2)     Status: None   Collection Time    04/29/14 11:38 PM      Result Value Ref Range Status   Specimen Description BLOOD RIGHT HAND   Final   Special Requests     Final   Value: BOTTLES DRAWN  AEROBIC AND ANAEROBIC AER 10CC ANA 7CC   Culture  Setup Time     Final   Value: 04/30/2014 03:39     Performed at [facility]   Culture     Final   Value:        BLOOD CULTURE RECEIVED NO GROWTH TO DATE CULTURE WILL BE HELD FOR 5 DAYS BEFORE ISSUING A FINAL NEGATIVE REPORT     Performed at [facility]   Report Status PENDING   Incomplete  SURGICAL PCR SCREEN     Status: None   Collection Time    05/02/14  1:10 PM      Result Value Ref Range Status   MRSA, PCR NEGATIVE  NEGATIVE Final   Staphylococcus aureus NEGATIVE  NEGATIVE Final   Comment:            The Xpert SA Assay (FDA     approved for NASAL specimens     in patients over 21 years of age),     is one component of     a comprehensive surveillance     program.  Test performance has     been validated by [facility] for patients greater       than or equal to 45 year old.     It is not intended     to diagnose infection nor to     guide or monitor treatment.    Anti-infectives   Start     Dose/Rate Route Frequency Ordered Stop   04/30/14 1500  Ampicillin-Sulbactam (UNASYN) 3 g in sodium chloride 0.9 % 100 mL IVPB     3 g 100 mL/hr over 60 Minutes Intravenous Every 6 hours 04/30/14 1424     04/29/14 2300  piperacillin-tazobactam (ZOSYN) IVPB 3.375 g  Status:  Discontinued     3.375 g 12.5 mL/hr over 240 Minutes Intravenous 3 times per day 04/29/14 2231 04/30/14 1251   04/29/14 2300  vancomycin (VANCOCIN) IVPB 1000 mg/200 mL premix  Status:  Discontinued     1,000 mg 200 mL/hr over 60 Minutes Intravenous Every 12 hours 04/29/14 2231 04/30/14 1301   04/29/14 2245  ampicillin-sulbactam (UNASYN) 1.5 g in sodium chloride 0.9 % 50 mL IVPB  Status:  Discontinued     1.5 g 100 mL/hr over 30 Minutes Intravenous Every 6 hours 04/29/14 2202 04/29/14 2214      Assessment: 73 yom initially presentedd with abdominal pain and started on broad-spectrum antibiotics. These were narrowed to unasyn alone on 7/31. Pt  is afebrile and WBC is WNL. Renal function is stable and dose remains appropriate. AST + alk phos elevated but down from admit. Tbili + lipase has normalized.   Zosyn 7/30>>7/31 Vanc 7/30>>7/31 Unasyn 7/31>>  7/30 blood - NGTD  Goal of Therapy:  Eradication of infection  Plan:  1. Continue unasyn 3gm IV Q6h 2. F/u renal fxn, C&S, clinical status and LOT  Ginia Rudell, Rande Lawman 05/03/2014,11:04 AM

## 2014-05-03 NOTE — Progress Notes (Signed)
Pt seen and examined Chart reviewed. Labs reviewed  I reviewed imaging with Dr. Marney Setting in radiology and I also discussed the imaging with one of my partners Dr. Barry Dienes.  The focal area of dilation in the common bile duct is subtle and not overtly impressive on MRI. Not sure if it just do to recent gallstone passage or in fact represents a true dilation of the common bile duct and one segment. Discussed several options with the patient and his wife along with the pros and cons.  #1-proceeding with cholecystectomy with interoperative angiogram today to take care of the gallbladder as well is to get formal imaging of his bile duct. Discussed the possibility that if this is in fact a true choledochal cyst, this may make future surgery a little bit more challenging due to scar tissue as a result of his cholecystectomy  #2-await endoscopic ultrasound to better delineate the anatomy of the common bile duct to determine whether or not patient just needs cholecystectomy versus cholecystectomy with hepaticojejunostomy with bile duct resection. Endoscopic would not be available to later in the week which would delay his hospital course.  In the background is the fact the patient has been off his antiplatelet therapy for his drug-eluting stentwhich makes him at increased risk for in-stent restenosis and potential MI.  Since I have reviewed the MRCP along with another radiologist and one of my partners I am not overtly overwhelmed with the findings on MRCP MR suspicion for a true choledochal cyst is low therefore I recommended proceeding to the operating room with the patient to do a cholecystectomy with cholangiogram. Discussed the possibility that the cholangiogram may be indeterminate as well however a formal x-ray the bile duct is the next in the workup  The patient and his wife have agreed to proceed with cholecystectomy with cholangiogram today.  I believe the patient's symptoms are consistent with  gallbladder disease.  We discussed gallbladder disease. The patient was given Neurosurgeon. We discussed non-operative and operative management. We discussed the signs & symptoms of acute cholecystitis  I discussed laparoscopic cholecystectomy with IOC in detail.  The patient was shown diagrams detailing the procedure.  We discussed the risks and benefits of a laparoscopic cholecystectomy including, but not limited to bleeding, infection, injury to surrounding structures such as the intestine or liver, bile leak, retained gallstones, need to convert to an open procedure, prolonged diarrhea, blood clots such as  DVT, common bile duct injury, anesthesia risks, and possible need for additional procedures.  We discussed the typical post-operative recovery course. I explained that the likelihood of improvement of their symptoms is good.  Note: This dictation was prepared with Dragon/digital dictation along with [employer]. Any transcriptional errors that result from this process are unintentional.  Leighton Ruff. Redmond Pulling, MD, FACS General, Bariatric, & Minimally Invasive Surgery [facility], [place]

## 2014-05-03 NOTE — Progress Notes (Signed)
Patient Demographics  [name], is a 73 y.o. male, DOB - 10/11/1940, TIR:[id number]  Admit date - 04/29/2014   Admitting Physician Shanda Howells, MD  Outpatient Primary MD for the patient is Zachary B., MD  LOS - 4   No chief complaint on file.       Subjective:   [name] today has, No headache, No chest pain, ++ epigastric abdominal pain - No Nausea, No new weakness tingling or numbness, No Cough - SOB.    Assessment & Plan    1. Gastric abdominal pain with findings consistent of CBD dilation, gallbladder wall thickening, intrahepatic biliary dilation and evidence of pancreatitis on CT scan at [facility]. Patient was at [facility] for the last 3 days prior to admission and was due to undergo ERCP but family chose to get him transferred here.    Workup suggested gallstone acute pancreatitis, GI surgery following an, continue empiric Unasyn and IV fluids, post MRCP with no CBD stone, ?CBD cyst, due for laparoscopic cholecystectomy on 05/03/2014 surgery still not decided, discussed with GI and surgery both. GI to do endoscopic ultrasound on 05/07/2014 in the outpatient setting.    2. CAD with RCA drug-eluting stent placed in December of 2014. Plavix - ASA on hold till surgery decides about the procedure , cardiology on board and following, will resume antiplatelets as soon as stable from GI and surgery standpoint. Perioperative Coreg continued.    3. Dyslipidemia - holding his statin due to pancreatitis n.p.o. status.    4. Hypertension. Oral Norvasc and Coreg onboard. ARB held for now.    5. GERD. IV PPI.    6.  H/O laryngeal cancer, treated with radiation therapy - outpatient followup no acute issues.     7. Low K -  replaced and monitor.      Code Status:  Full  Family Communication: Daughter  Disposition Plan: TBD   Procedures     Consults  GI, CCS   Medications  Scheduled Meds: . amLODipine  5 mg Oral Daily  . ampicillin-sulbactam (UNASYN) IV  3 g Intravenous Q6H  . antiseptic oral rinse  7 mL Mouth Rinse BID  . carvedilol  3.125 mg Oral BID WC  . chlorhexidine  15 mL Mouth Rinse BID  . heparin  5,000 Units Subcutaneous 3 times per day  . pantoprazole  40 mg Oral Q0600  . potassium chloride  40 mEq Oral Once   Continuous Infusions:   PRN Meds:.acetaminophen, fluticasone, HYDROcodone-acetaminophen, LORazepam, morphine injection  DVT Prophylaxis   Heparin    Lab Results  Component Value Date   PLT 216 05/03/2014    Antibiotics     Anti-infectives   Start     Dose/Rate Route Frequency Ordered Stop   04/30/14 1500  Ampicillin-Sulbactam (UNASYN) 3 g in sodium chloride 0.9 % 100 mL IVPB     3 g 100 mL/hr over 60 Minutes Intravenous Every 6 hours 04/30/14 1424     04/29/14 2300  piperacillin-tazobactam (ZOSYN) IVPB 3.375 g  Status:  Discontinued     3.375 g 12.5 mL/hr over 240 Minutes Intravenous 3 times per day 04/29/14 2231 04/30/14 1251   04/29/14 2300  vancomycin (VANCOCIN) IVPB 1000 mg/200 mL premix  Status:  Discontinued  1,000 mg 200 mL/hr over 60 Minutes Intravenous Every 12 hours 04/29/14 2231 04/30/14 1301   04/29/14 2245  ampicillin-sulbactam (UNASYN) 1.5 g in sodium chloride 0.9 % 50 mL IVPB  Status:  Discontinued     1.5 g 100 mL/hr over 30 Minutes Intravenous Every 6 hours 04/29/14 2202 04/29/14 2214          Objective:   Filed Vitals:   05/02/14 2021 05/03/14 0458 05/03/14 0720 05/03/14 0957  BP: 162/68 160/73 144/70 135/58  Pulse: 69 73 69   Temp: 99.4 F (37.4 C) 98.2 F (36.8 C)    TempSrc: Oral Axillary    Resp: 16 16    Height:      Weight:      SpO2: 92% 93%      Wt Readings from Last 3 Encounters:  04/29/14 96.026 kg (211 lb 11.2 oz)  02/12/14 96.616 kg (213 lb)  10/13/13  98.884 kg (218 lb)     Intake/Output Summary (Last 24 hours) at 05/03/14 1138 Last data filed at 05/03/14 0947  Gross per 24 hour  Intake 866.67 ml  Output      0 ml  Net 866.67 ml     Physical Exam  Awake Alert, Oriented X 3, No new F.N deficits, Normal affect [place].AT,PERRAL Supple Neck,No JVD, No cervical lymphadenopathy appriciated.  Symmetrical Chest wall movement, Good air movement bilaterally, CTAB RRR,No Gallops,Rubs or new Murmurs, No Parasternal Heave +ve SmithSounds, Abd Soft, mild but positive epigastric tenderness, No organomegaly appriciated, No rebound - guarding or rigidity. No Cyanosis, Clubbing or edema, No new Rash or bruise      Data Review   Micro Results Recent Results (from the past 240 hour(s))  CULTURE, BLOOD (ROUTINE X 2)     Status: None   Collection Time    04/29/14 11:25 PM      Result Value Ref Range Status   Specimen Description BLOOD RIGHT ANTECUBITAL   Final   Special Requests     Final   Value: BOTTLES DRAWN AEROBIC AND ANAEROBIC AER 10CC ANA 5CC   Culture  Setup Time     Final   Value: 04/30/2014 03:39     Performed at [facility]   Culture     Final   Value:        BLOOD CULTURE RECEIVED NO GROWTH TO DATE CULTURE WILL BE HELD FOR 5 DAYS BEFORE ISSUING A FINAL NEGATIVE REPORT     Performed at [facility]   Report Status PENDING   Incomplete  CULTURE, BLOOD (ROUTINE X 2)     Status: None   Collection Time    04/29/14 11:38 PM      Result Value Ref Range Status   Specimen Description BLOOD RIGHT HAND   Final   Special Requests     Final   Value: BOTTLES DRAWN AEROBIC AND ANAEROBIC AER 10CC ANA [place]   Culture  Setup Time     Final   Value: 04/30/2014 03:39     Performed at [facility]   Culture     Final   Value:        BLOOD CULTURE RECEIVED NO GROWTH TO DATE CULTURE WILL BE HELD FOR 5 DAYS BEFORE ISSUING A FINAL NEGATIVE REPORT     Performed at [facility]   Report Status PENDING   Incomplete    SURGICAL PCR SCREEN     Status: None   Collection Time    05/02/14  1:10 PM      Result Value Ref Range Status   MRSA, PCR NEGATIVE  NEGATIVE Final   Staphylococcus aureus NEGATIVE  NEGATIVE Final   Comment:            The Xpert SA Assay (FDA     approved for NASAL specimens     in patients over 39 years of age),     is one component of     a comprehensive surveillance     program.  Test performance has     been validated by [employer] for patients greater     than or equal to 46 year old.     It is not intended     to diagnose infection nor to     guide or monitor treatment.    Radiology Reports No results found.  CBC  Recent Labs Lab 04/29/14 2325 05/01/14 0600 05/02/14 0445 05/03/14 0607  WBC 9.1 9.6 8.1 7.6  HGB 13.2 13.0 12.0* 12.4*  HCT 39.1 39.2 35.6* 36.5*  PLT 191 175 197 216  MCV 86.5 88.3 85.6 84.5  MCH 29.2 29.3 28.8 28.7  MCHC 33.8 33.2 33.7 34.0  RDW 14.2 14.2 14.1 14.1    Chemistries   Recent Labs Lab 04/29/14 2325 04/30/14 0950 05/01/14 0600 05/02/14 0830 05/03/14 0607  NA  --  140 137 138 141  K  --  3.7 4.0 3.5* 3.6*  CL  --  104 105 105 106  CO2  --  23 24 21 23   GLUCOSE  --  87 97 112* 107*  BUN  --  16 14 10 10   CREATININE 0.89 0.94 0.83 0.66 0.80  CALCIUM  --  8.9 8.9 8.9 9.2  MG  --   --   --  1.7 1.9  AST  --  74* 40* 28 46*  ALT  --  97* 60* 42 39  ALKPHOS  --  156* 127* 112 118*  BILITOT  --  3.2* 1.3* 0.9 0.9   ------------------------------------------------------------------------------------------------------------------ estimated creatinine clearance is 91 ml/min (by C-G formula based on Cr of 0.8). ------------------------------------------------------------------------------------------------------------------ No results found for this basename: HGBA1C,  in the last 72 hours ------------------------------------------------------------------------------------------------------------------ No results found for  this basename: CHOL, HDL, LDLCALC, TRIG, CHOLHDL, LDLDIRECT,  in the last 72 hours ------------------------------------------------------------------------------------------------------------------ No results found for this basename: TSH, T4TOTAL, FREET3, T3FREE, THYROIDAB,  in the last 72 hours ------------------------------------------------------------------------------------------------------------------ No results found for this basename: VITAMINB12, FOLATE, FERRITIN, TIBC, IRON, RETICCTPCT,  in the last 72 hours  Coagulation profile  Recent Labs Lab 04/30/14 0950  INR 1.30    No results found for this basename: DDIMER,  in the last 72 hours  Cardiac Enzymes No results found for this basename: CK, CKMB, TROPONINI, MYOGLOBIN,  in the last 168 hours ------------------------------------------------------------------------------------------------------------------ No components found with this basename: POCBNP,      Time Spent in minutes   35   Dionne Knoop K M.D on 05/03/2014 at 11:38 AM  Between 7am to 7pm - Pager - [phone]  After 7pm go to [email or web address] - password [email or web address]  And look for the night coverage person covering for me after hours  [facility] Office  [phone]   **Disclaimer: This note may have been dictated with voice recognition software. Similar sounding words can inadvertently be transcribed and this note may contain transcription errors which may not have been corrected upon publication of note.**

## 2014-05-03 NOTE — Progress Notes (Signed)
Pt got nauseous of his clear liquid diet. I called the on call an order for Zofran prn. This is effective. I will continue to monitor.   Azai Gaffin J. Conley Canal RN MSN

## 2014-05-03 NOTE — Progress Notes (Signed)
Dr. Candiss Norse made aware of patient's NPO status and need for beta blocker prior to surgery. Verbal okay to give scheduled daily dose of Coreg prior to with one sip of water. Will continue to monitor blood pressures.

## 2014-05-03 NOTE — Op Note (Signed)
[name] [id number] 06-Nov-1940 05/03/2014  Laparoscopic Cholecystectomy with IOC Procedure Note  Indications: This patient presents with symptomatic gallbladder disease and will undergo laparoscopic cholecystectomy with ioc. The patient had presented with pancreatitis and there was some radiological evidence of gallstone disease. There is also question of abnormal bile duct diameters. He underwent an MR CP this past weekend which did not show any evidence of choledocholithiasis however there was one small section of his common bile duct that had some mild limitation. It was read as possible choledochal cyst. I reviewed the radiology with the on-call radiologist today along with my hepatobiliary partner and the focal area of dilation of the common bile duct was not overtly impressive. An endoscopic ultrasound would not be able to be done later in the week. The patient had also been off his Plavix and needed to get back on his Plavix because of his drug-eluting cardiac stent. Because the MRCP was not that impressive with respect to the common bile duct, I thought the safest course of action was to proceed with cholecystectomy with intraoperative cholangiogram today. I have discussed extensively with the patient and his wife about the potential pros and cons of this. They elected to proceed with surgery. I have discussed the risk and benefits of cholecystectomy with cholangiogram and the potential need for additional imaging and/or procedures.  Pre-operative Diagnosis: gallstone pancreatitis  Post-operative Diagnosis: gallstone pancreatitis, chronic calculous cholecystitis.   Surgeon: Gayland Curry   Assistants: none  Anesthesia: General endotracheal anesthesia  ASA Class: 3  Procedure Details  The patient was seen again in the Holding Room. The risks, benefits, complications, treatment options, and expected outcomes were discussed with the patient. The possibilities of reaction to medication,  pulmonary aspiration, perforation of viscus, bleeding, recurrent infection, finding a normal gallbladder, the need for additional procedures, failure to diagnose a condition, the possible need to convert to an open procedure, and creating a complication requiring transfusion or operation were discussed with the patient. The likelihood of improving the patient's symptoms with return to their baseline status is good.  The patient and/or family concurred with the proposed plan, giving informed consent. The site of surgery properly noted. The patient was taken to Operating Room, identified as [name] and the procedure verified as Laparoscopic Cholecystectomy with Intraoperative Cholangiogram. A Time Out was held and the above information confirmed. Antibiotic prophylaxis was administered.   Prior to the induction of general anesthesia, antibiotic prophylaxis was administered. General endotracheal anesthesia was then administered and tolerated well. After the induction, the abdomen was prepped with Chloraprep and draped in the sterile fashion. The patient was positioned in the supine position.  Local anesthetic agent was injected into the skin near the umbilicus and an incision made. We dissected down to the abdominal fascia with blunt dissection.  The fascia was incised vertically and we entered the peritoneal cavity bluntly.  A pursestring suture of 0-Vicryl was placed around the fascial opening.  The Hasson cannula was inserted and secured with the stay suture.  Pneumoperitoneum was then created with CO2 and tolerated well without any adverse changes in the patient's vital signs. An 5-mm port was placed in the subxiphoid position.  Two 5-mm ports were placed in the right upper quadrant. All skin incisions were infiltrated with a local anesthetic agent before making the incision and placing the trocars.   We positioned the patient in reverse Trendelenburg, tilted slightly to the patient's left.  The  gallbladder was identified, the fundus grasped and  retracted cephalad. There were a fair amount of significant omental adhesions stuck to the body and dome of the gallbladder. Adhesions were lysed bluntly and with the electrocautery where indicated, taking care not to injure any adjacent organs or viscus. The infundibulum was grasped and retracted laterally, exposing the peritoneum overlying the triangle of Calot. The gallbladder had a mildly thickened peritoneum encasing it.This peritoneum over the triangle was then divided and exposed in a blunt fashion. A critical view of the cystic duct and cystic artery was obtained.  The cystic duct was clearly identified and bluntly dissected circumferentially. I had dissected out the cystic duct at the junction with the gallbladder. The gallbladder was quite long and I felt I could not do any more proximal dissection on the cystic duct due to some of the ongoing edema at the proximal biliary tree. The cystic duct was dilated. In order to get better length on the cystic duct I decided to go ahead and ligate the cystic artery. 2 clips were placed on the proximal aspect of the cystic artery and one distally as it entered the gallbladder it was then transected Endo Shears. There is nothing else entering the gallbladder other than the cystic duct. The cystic duct was ligated with a clip distally.   An incision was made in the cystic duct and the [facility] cholangiogram catheter introduced. The catheter was secured using a clip. A cholangiogram was then obtained which showed good visualization of the distal and proximal biliary tree with no sign of filling defects or obstruction.  Contrast flowed easily into the duodenum. The catheter was then removed.   The cystic duct was then ligated with 1 clips and divided. Because the cystic duct was dilated I placed 2 PDS Endoloops around the cystic duct stump.   The gallbladder was dissected from the liver bed in retrograde fashion with the  electrocautery. There was some spillage of bile from the gallbladder as I was mobilizing it. The gallbladder was removed and placed in an Endocatch sac.  The gallbladder and Endocatch sac were then removed through the umbilical port site. The liver bed was irrigated and inspected. Hemostasis was achieved with the electrocautery. Copious irrigation was utilized and was repeatedly aspirated until clear.  The pursestring suture was used to close the umbilical fascia.    We again inspected the right upper quadrant for hemostasis.  The umbilical closure was inspected and there was an air leak and nothing trapped within the closure. An additional figure-of-eight 0 Vicryl suture was used at the umbilical fascia and there was no air leak at this point. Pneumoperitoneum was released as we removed the trocars.  4-0 Monocryl was used to close the skin.   Benzoin, steri-strips, and clean dressings were applied. The patient was then extubated and brought to the recovery room in stable condition. Instrument, sponge, and needle counts were correct at closure and at the conclusion of the case.   Findings: Chronic Cholecystitis with Cholelithiasis; dilated cystic duct. No evidence of filling defect on cholangiogram. Dilated common hepatic, left & right hepatic duct and small dilation at beginning of CBD. Will review with radiology and GI  Estimated Blood Loss: Minimal         Drains: none         Specimens: Gallbladder           Complications: None; patient tolerated the procedure well.         Disposition: PACU - hemodynamically stable.  Condition: stable  Leighton Ruff. Redmond Pulling, MD, FACS General, Bariatric, & Minimally Invasive Surgery [facility], [place]

## 2014-05-03 NOTE — Progress Notes (Signed)
Daily Rounding Note  05/03/2014, 9:06 AM  LOS: 4 days   SUBJECTIVE:       Pain is better but still requiring prn narcotics.  No vomiting, appetite returning.   OBJECTIVE:         Vital signs in last 24 hours:    Temp:  [98.2 F (36.8 C)-99.4 F (37.4 C)] 98.2 F (36.8 C) (08/03 0458) Pulse Rate:  [63-77] 69 (08/03 0720) Resp:  [16-18] 16 (08/03 0458) BP: (144-162)/(66-73) 144/70 mmHg (08/03 0720) SpO2:  [92 %-93 %] 93 % (08/03 0458) Last BM Date: 05/02/14 General: pleasant, comfortable   Heart: RRR Chest: clear bil.  Abdomen: soft, slight tenderness upper abdomen  Extremities: no CCE Neuro/Psych:  Pleasant, relaxed, oriented x 3.   Intake/Output from previous day: 08/02 0701 - 08/03 0700 In: 1110.8 [I.V.:1010.8; IV Piggyback:100] Out: -   Intake/Output this shift:    Lab Results:  Recent Labs  05/01/14 0600 05/02/14 0445 05/03/14 0607  WBC 9.6 8.1 7.6  HGB 13.0 12.0* 12.4*  HCT 39.2 35.6* 36.5*  PLT 175 197 216   BMET  Recent Labs  05/01/14 0600 05/02/14 0830 05/03/14 0607  NA 137 138 141  K 4.0 3.5* 3.6*  CL 105 105 106  CO2 24 21 23   GLUCOSE 97 112* 107*  BUN 14 10 10   CREATININE 0.83 0.66 0.80  CALCIUM 8.9 8.9 9.2   LFT  Recent Labs  05/01/14 0600 05/02/14 0830 05/03/14 0607  PROT 5.8* 6.0 6.2  ALBUMIN 2.7* 2.5* 2.5*  AST 40* 28 46*  ALT 60* 42 39  ALKPHOS 127* 112 118*  BILITOT 1.3* 0.9 0.9   PT/INR  Recent Labs  04/30/14 0950  LABPROT 16.2*  INR 1.30   Hepatitis Panel No results found for this basename: HEPBSAG, HCVAB, HEPAIGM, HEPBIGM,  in the last 72 hours  Studies/Results: Mr 3d Recon At Scanner  05/01/2014   CLINICAL DATA:  Dilated CBD on CT and ultrasound, pancreatitis  EXAM: MRI ABDOMEN WITHOUT AND WITH CONTRAST (INCLUDING MRCP)  TECHNIQUE: Multiplanar multisequence MR imaging of the abdomen was performed both before and after the administration of  intravenous contrast. Heavily T2-weighted images of the biliary and pancreatic ducts were obtained, and three-dimensional MRCP images were rendered by post processing.  CONTRAST:  66mL MULTIHANCE GADOBENATE DIMEGLUMINE 529 MG/ML IV SOLN  COMPARISON:  [facility] abdominal ultrasound dated 04/29/2014 and CT abdomen pelvis dated 04/28/2014.  FINDINGS: Motion degraded images.  Small to moderate right and small left pleural effusions. Associated bilateral lower lobe atelectasis.  Liver is within normal limits. No suspicious/enhancing hepatic lesions. No hepatic steatosis.  Spleen and adrenal glands are within normal limits.  Peripancreatic inflammatory changes/fluid along the body/tail (series 10/ image 17), compatible with known acute pancreatitis. No associated dilatation of the main pancreatic duct. No peripancreatic fluid collection/pseudocyst.  Suspected sludge in the gallbladder neck (series 4/image 24). No gallstones, gallbladder wall thickening, or pericholecystic fluid.  Proximal common duct is dilated, measuring 11 mm (series 4/ image 22). Fusiform dilatation of the mid common duct, measuring up to 16 mm (series 4/image 22), suggesting a type 1 choledochal cyst. Smooth tapering of the distal common duct, measuring 8 mm (series 4/ image 23). No evidence of choledocholithiasis.  Tiny left lower pole renal cyst (series 6/ image 40). Kidneys are otherwise within normal limits. No hydronephrosis.  No suspicious abdominal lymphadenopathy.  No focal osseous lesions.  IMPRESSION: Motion degraded images.  Acute pancreatitis, without  evidence of complication.  Gallbladder sludge, without associated inflammatory changes.  Dilated common duct with possible type 1 choledochal cyst. No evidence of choledocholithiasis.   Electronically Signed   By: Julian Hy M.D.   On: 05/01/2014 17:19   Mr Abd W/wo Cm/mrcp  05/01/2014   CLINICAL DATA:  Dilated CBD on CT and ultrasound, pancreatitis  EXAM: MRI ABDOMEN WITHOUT AND WITH  CONTRAST (INCLUDING MRCP)  TECHNIQUE: Multiplanar multisequence MR imaging of the abdomen was performed both before and after the administration of intravenous contrast. Heavily T2-weighted images of the biliary and pancreatic ducts were obtained, and three-dimensional MRCP images were rendered by post processing.  CONTRAST:  93mL MULTIHANCE GADOBENATE DIMEGLUMINE 529 MG/ML IV SOLN  COMPARISON:  [facility] abdominal ultrasound dated 04/29/2014 and CT abdomen pelvis dated 04/28/2014.  FINDINGS: Motion degraded images.  Small to moderate right and small left pleural effusions. Associated bilateral lower lobe atelectasis.  Liver is within normal limits. No suspicious/enhancing hepatic lesions. No hepatic steatosis.  Spleen and adrenal glands are within normal limits.  Peripancreatic inflammatory changes/fluid along the body/tail (series 10/ image 17), compatible with known acute pancreatitis. No associated dilatation of the main pancreatic duct. No peripancreatic fluid collection/pseudocyst.  Suspected sludge in the gallbladder neck (series 4/image 24). No gallstones, gallbladder wall thickening, or pericholecystic fluid.  Proximal common duct is dilated, measuring 11 mm (series 4/ image 22). Fusiform dilatation of the mid common duct, measuring up to 16 mm (series 4/image 22), suggesting a type 1 choledochal cyst. Smooth tapering of the distal common duct, measuring 8 mm (series 4/ image 23). No evidence of choledocholithiasis.  Tiny left lower pole renal cyst (series 6/ image 40). Kidneys are otherwise within normal limits. No hydronephrosis.  No suspicious abdominal lymphadenopathy.  No focal osseous lesions.  IMPRESSION: Motion degraded images.  Acute pancreatitis, without evidence of complication.  Gallbladder sludge, without associated inflammatory changes.  Dilated common duct with possible type 1 choledochal cyst. No evidence of choledocholithiasis.   Electronically Signed   By: Julian Hy M.D.   On:  05/01/2014 17:19    ASSESMENT:   *  Acute pancreatitis, likely biliary.  Surgery timing for cholecystectomy on hold.  *  Choledochal cyst. If this is truly present pt will need hepatico-jejunostomy.  *  CAD, DES to LAD placed 08/2013.  Has been off Plavix since 7/22, with one dose on 7/30.    PLAN   *  Surgery interested in definitive diagnoses of the cyst so they can plan appropriate surgery. Messaged Dr Ardis Hughs. *  Note that po intake does not cause pancrease to become more inflamed, so I started clears.     Azucena Freed  05/03/2014, 9:06 AM Pager: [phone]

## 2014-05-03 NOTE — Progress Notes (Signed)
Patient notified staff that he wanted to take off his CPAP. Will continue to monitor.

## 2014-05-03 NOTE — Progress Notes (Signed)
[facility] Progress Note     Subjective: Pt doing well, pain significantly improved as well as lipase.  Pt was told by Dr. Carlean Purl yesterday that he may need ERCP/EUS because of CBD cyst which may have cancer potential.    Objective: Vital signs in last 24 hours: Temp:  [98.2 F (36.8 C)-99.4 F (37.4 C)] 98.2 F (36.8 C) (08/03 0458) Pulse Rate:  [63-77] 69 (08/03 0720) Resp:  [16-18] 16 (08/03 0458) BP: (144-162)/(66-73) 144/70 mmHg (08/03 0720) SpO2:  [92 %-93 %] 93 % (08/03 0458) Last BM Date: 05/02/14  Intake/Output from previous day: 08/02 0701 - 08/03 0700 In: 1110.8 [I.V.:1010.8; IV Piggyback:100] Out: -  Intake/Output this shift:    PE: Gen:  Alert, NAD, pleasant Abd: Soft, NT, mild distension, +BS, no HSM   Lab Results:   Recent Labs  05/02/14 0445 05/03/14 0607  WBC 8.1 7.6  HGB 12.0* 12.4*  HCT 35.6* 36.5*  PLT 197 216   BMET  Recent Labs  05/02/14 0830 05/03/14 0607  NA 138 141  K 3.5* 3.6*  CL 105 106  CO2 21 23  GLUCOSE 112* 107*  BUN 10 10  CREATININE 0.66 0.80  CALCIUM 8.9 9.2   PT/INR  Recent Labs  04/30/14 0950  LABPROT 16.2*  INR 1.30   CMP     Component Value Date/Time   NA 141 05/03/2014 0607   K 3.6* 05/03/2014 0607   CL 106 05/03/2014 0607   CO2 23 05/03/2014 0607   GLUCOSE 107* 05/03/2014 0607   BUN 10 05/03/2014 0607   CREATININE 0.80 05/03/2014 0607   CALCIUM 9.2 05/03/2014 0607   PROT 6.2 05/03/2014 0607   ALBUMIN 2.5* 05/03/2014 0607   AST 46* 05/03/2014 0607   ALT 39 05/03/2014 0607   ALKPHOS 118* 05/03/2014 0607   BILITOT 0.9 05/03/2014 0607   GFRNONAA 86* 05/03/2014 0607   GFRAA >90 05/03/2014 0607   Lipase     Component Value Date/Time   LIPASE 25 05/03/2014 0607       Studies/Results: Mr 3d Recon At Scanner  05/01/2014   CLINICAL DATA:  Dilated CBD on CT and ultrasound, pancreatitis  EXAM: MRI ABDOMEN WITHOUT AND WITH CONTRAST (INCLUDING MRCP)  TECHNIQUE: Multiplanar multisequence MR imaging of the abdomen  was performed both before and after the administration of intravenous contrast. Heavily T2-weighted images of the biliary and pancreatic ducts were obtained, and three-dimensional MRCP images were rendered by post processing.  CONTRAST:  66mL MULTIHANCE GADOBENATE DIMEGLUMINE 529 MG/ML IV SOLN  COMPARISON:  [facility] abdominal ultrasound dated 04/29/2014 and CT abdomen pelvis dated 04/28/2014.  FINDINGS: Motion degraded images.  Small to moderate right and small left pleural effusions. Associated bilateral lower lobe atelectasis.  Liver is within normal limits. No suspicious/enhancing hepatic lesions. No hepatic steatosis.  Spleen and adrenal glands are within normal limits.  Peripancreatic inflammatory changes/fluid along the body/tail (series 10/ image 17), compatible with known acute pancreatitis. No associated dilatation of the main pancreatic duct. No peripancreatic fluid collection/pseudocyst.  Suspected sludge in the gallbladder neck (series 4/image 24). No gallstones, gallbladder wall thickening, or pericholecystic fluid.  Proximal common duct is dilated, measuring 11 mm (series 4/ image 22). Fusiform dilatation of the mid common duct, measuring up to 16 mm (series 4/image 22), suggesting a type 1 choledochal cyst. Smooth tapering of the distal common duct, measuring 8 mm (series 4/ image 23). No evidence of choledocholithiasis.  Tiny left lower pole renal cyst (series 6/ image 40). Kidneys  are otherwise within normal limits. No hydronephrosis.  No suspicious abdominal lymphadenopathy.  No focal osseous lesions.  IMPRESSION: Motion degraded images.  Acute pancreatitis, without evidence of complication.  Gallbladder sludge, without associated inflammatory changes.  Dilated common duct with possible type 1 choledochal cyst. No evidence of choledocholithiasis.   Electronically Signed   By: Julian Hy M.D.   On: 05/01/2014 17:19   Mr Abd W/wo Cm/mrcp  05/01/2014   CLINICAL DATA:  Dilated CBD on CT and  ultrasound, pancreatitis  EXAM: MRI ABDOMEN WITHOUT AND WITH CONTRAST (INCLUDING MRCP)  TECHNIQUE: Multiplanar multisequence MR imaging of the abdomen was performed both before and after the administration of intravenous contrast. Heavily T2-weighted images of the biliary and pancreatic ducts were obtained, and three-dimensional MRCP images were rendered by post processing.  CONTRAST:  1mL MULTIHANCE GADOBENATE DIMEGLUMINE 529 MG/ML IV SOLN  COMPARISON:  [facility] abdominal ultrasound dated 04/29/2014 and CT abdomen pelvis dated 04/28/2014.  FINDINGS: Motion degraded images.  Small to moderate right and small left pleural effusions. Associated bilateral lower lobe atelectasis.  Liver is within normal limits. No suspicious/enhancing hepatic lesions. No hepatic steatosis.  Spleen and adrenal glands are within normal limits.  Peripancreatic inflammatory changes/fluid along the body/tail (series 10/ image 17), compatible with known acute pancreatitis. No associated dilatation of the main pancreatic duct. No peripancreatic fluid collection/pseudocyst.  Suspected sludge in the gallbladder neck (series 4/image 24). No gallstones, gallbladder wall thickening, or pericholecystic fluid.  Proximal common duct is dilated, measuring 11 mm (series 4/ image 22). Fusiform dilatation of the mid common duct, measuring up to 16 mm (series 4/image 22), suggesting a type 1 choledochal cyst. Smooth tapering of the distal common duct, measuring 8 mm (series 4/ image 23). No evidence of choledocholithiasis.  Tiny left lower pole renal cyst (series 6/ image 40). Kidneys are otherwise within normal limits. No hydronephrosis.  No suspicious abdominal lymphadenopathy.  No focal osseous lesions.  IMPRESSION: Motion degraded images.  Acute pancreatitis, without evidence of complication.  Gallbladder sludge, without associated inflammatory changes.  Dilated common duct with possible type 1 choledochal cyst. No evidence of choledocholithiasis.    Electronically Signed   By: Julian Hy M.D.   On: 05/01/2014 17:19    Anti-infectives: Anti-infectives   Start     Dose/Rate Route Frequency Ordered Stop   04/30/14 1500  Ampicillin-Sulbactam (UNASYN) 3 g in sodium chloride 0.9 % 100 mL IVPB     3 g 100 mL/hr over 60 Minutes Intravenous Every 6 hours 04/30/14 1424     04/29/14 2300  piperacillin-tazobactam (ZOSYN) IVPB 3.375 g  Status:  Discontinued     3.375 g 12.5 mL/hr over 240 Minutes Intravenous 3 times per day 04/29/14 2231 04/30/14 1251   04/29/14 2300  vancomycin (VANCOCIN) IVPB 1000 mg/200 mL premix  Status:  Discontinued     1,000 mg 200 mL/hr over 60 Minutes Intravenous Every 12 hours 04/29/14 2231 04/30/14 1301   04/29/14 2245  ampicillin-sulbactam (UNASYN) 1.5 g in sodium chloride 0.9 % 50 mL IVPB  Status:  Discontinued     1.5 g 100 mL/hr over 30 Minutes Intravenous Every 6 hours 04/29/14 2202 04/29/14 2214       Assessment/Plan Gallstone pancreatitis  Cholecystitis  Ileus   Plan:  1. Unasyn Day 4. 2. NPO, until we confirm endo procedure will not be done, bowel rest, IVF, pain control, antiemetics  3. SCD's and heparin for DVT proph  4. Ambulate and IS  5. Pain and lipase improved.  MRCP shows no stones, but shows choledochal cyst type 1. Dr Redmond Pulling does not want to operate on his gallbladder until anatomy is confirmed per GI workup.  DES complicates things with his surgery, and his plavix use.  GI recommends EUS/ERCP, but this could not likely be accomplished until thursday.  He is at risk for complications if he has anatomical variants (including choledochal cyst) these need to be further investigated prior to attempting lap chole.  He may require more invasive surgery and possibly bypass of bile duct which may require transfer to tertiary care facility.  He does need to resume Plavix ASAP. 6. Cont Dulcolax  7.  Dr. Redmond Pulling to see the patient and discuss this with the family.    LOS: 4 days    Coralie Keens 05/03/2014, 8:23 AM Pager: [phone]   Addendum: 11:30am  Dr. Redmond Pulling discussed with radiology, and he has decided to proceed with lap chole today.  He has had some breakfast, but anesthesia are okay with them proceeding with surgery later today.  Hold off on discharge and resuming plavix.

## 2014-05-03 NOTE — Progress Notes (Signed)
Report given to Short Stay RN in preporation for OR. RN made aware that pt is without IV access and IV team had been called.

## 2014-05-03 NOTE — Anesthesia Preprocedure Evaluation (Signed)
Anesthesia Evaluation  Patient identified by MRN, date of birth, ID band Patient awake    Reviewed: Allergy & Precautions, H&P , NPO status , Patient's Chart, lab work & pertinent test results  Airway Mallampati: II TM Distance: >3 FB Neck ROM: Full    Dental  (+) Edentulous Upper, Dental Advisory Given   Pulmonary former smoker,  breath sounds clear to auscultation        Cardiovascular hypertension, Rhythm:Regular Rate:Normal     Neuro/Psych    GI/Hepatic   Endo/Other    Renal/GU      Musculoskeletal   Abdominal   Peds  Hematology   Anesthesia Other Findings   Reproductive/Obstetrics                           Anesthesia Physical Anesthesia Plan  ASA: III  Anesthesia Plan: General   Post-op Pain Management:    Induction: Intravenous  Airway Management Planned: Oral ETT  Additional Equipment:   Intra-op Plan:   Post-operative Plan: Extubation in OR  Informed Consent: I have reviewed the patients History and Physical, chart, labs and discussed the procedure including the risks, benefits and alternatives for the proposed anesthesia with the patient or authorized representative who has indicated his/her understanding and acceptance.   Dental advisory given  Plan Discussed with: CRNA and Anesthesiologist  Anesthesia Plan Comments: (Symptomatic cholelithiasis CAD S/P PTCA with DES to proximal LAD 09/10/2013 no angina, off plavix x 5 days H/O Laryngeal Ca S/P XRT 2008 Hypertension  Sleep apnea on CPAP Former smoker quit [place] with oral ETT   Roberts Gaudy, MD)        Anesthesia Quick Evaluation

## 2014-05-03 NOTE — Care Management Note (Signed)
    Page 1 of 2   05/05/2014     3:38:24 PM CARE MANAGEMENT NOTE 05/05/2014  Patient:  [name], [name]   Account Number:  [id number]  Date Initiated:  05/03/2014  Documentation initiated by:  Tomi Bamberger  Subjective/Objective Assessment:   dx pancreatitis  admit- lives with spouse     Action/Plan:   8/3 lap chole with intra agiogram  pt eval- rec no pt f/u, but need rolling walker.   Anticipated DC Date:  05/05/2014   Anticipated DC Plan:  HOME/SELF CARE      DC Planning Services  CM consult      PAC Choice  DURABLE MEDICAL EQUIPMENT   Choice offered to / List presented to:  C-1 Patient   DME arranged  Vassie Moselle      DME agency  [place].        Status of service:  Completed, signed off Medicare Important Message given?  YES (If response is "NO", the following Medicare IM given date fields will be blank) Date Medicare IM given:  05/03/2014 Medicare IM given by:  Tomi Bamberger Date Additional Medicare IM given:   Additional Medicare IM given by:    Discharge Disposition:  HOME/SELF CARE  Per UR Regulation:  Reviewed for med. necessity/level of care/duration of stay  If discussed at [place] of Stay Meetings, dates discussed:    Comments:  05/05/14 [facility], BSN [phone] patient did not eat much lunch, wife is concerned that he is not eating, think he is afraid to eat, will see if he tolerates dinner if so will dc home with rolling walker. Jermaine with [facility] notiifed regarding rolling walker.  05/03/14 [place], BSN [phone] patient for lap chole today.

## 2014-05-03 NOTE — Progress Notes (Signed)
Pt refusing CPAP tonight, only wants to wear O2. Informed to have RN call if he changes his mind.

## 2014-05-03 NOTE — Progress Notes (Signed)
I agree with the above documentation, including the assessment and plan. Discussion between Dr. Ardis Hughs and Dr. Redmond Pulling (general surgery). Decision made to proceed with laparoscopic cholecystectomy today with IOC, after Dr. Redmond Pulling reviewed MRCP images with radiology Will not proceed to EUS or ERCP at present unless findings at cholecystectomy/IOC warrant change to this plan Call with questions

## 2014-05-04 ENCOUNTER — Other Ambulatory Visit (INDEPENDENT_AMBULATORY_CARE_PROVIDER_SITE_OTHER): Payer: Self-pay | Admitting: General Surgery

## 2014-05-04 ENCOUNTER — Telehealth (INDEPENDENT_AMBULATORY_CARE_PROVIDER_SITE_OTHER): Payer: Self-pay

## 2014-05-04 DIAGNOSIS — Q444 Choledochal cyst: Secondary | ICD-10-CM

## 2014-05-04 LAB — COMPREHENSIVE METABOLIC PANEL
ALK PHOS: 107 U/L (ref 39–117)
ALT: 46 U/L (ref 0–53)
AST: 66 U/L — ABNORMAL HIGH (ref 0–37)
Albumin: 2.3 g/dL — ABNORMAL LOW (ref 3.5–5.2)
Anion gap: 15 (ref 5–15)
BILIRUBIN TOTAL: 0.6 mg/dL (ref 0.3–1.2)
BUN: 16 mg/dL (ref 6–23)
CO2: 23 mEq/L (ref 19–32)
Calcium: 9 mg/dL (ref 8.4–10.5)
Chloride: 105 mEq/L (ref 96–112)
Creatinine, Ser: 0.82 mg/dL (ref 0.50–1.35)
GFR calc Af Amer: >90 mL/min (ref >90)
GFR calc non Af Amer: 85 mL/min — ABNORMAL LOW (ref >90)
Glucose, Bld: 131 mg/dL — ABNORMAL HIGH (ref 70–99)
POTASSIUM: 4.6 meq/L (ref 3.7–5.3)
SODIUM: 143 meq/L (ref 137–147)
Total Protein: 5.9 g/dL — ABNORMAL LOW (ref 6.0–8.3)

## 2014-05-04 LAB — CBC
HEMATOCRIT: 36.5 % — AB (ref 39.0–52.0)
Hemoglobin: 12.5 g/dL — ABNORMAL LOW (ref 13.0–17.0)
MCH: 29.8 pg (ref 26.0–34.0)
MCHC: 34.2 g/dL (ref 30.0–36.0)
MCV: 86.9 fL (ref 78.0–100.0)
Platelets: 228 10*3/uL (ref 150–400)
RBC: 4.2 MIL/uL — ABNORMAL LOW (ref 4.22–5.81)
RDW: 14.4 % (ref 11.5–15.5)
WBC: 7.7 10*3/uL (ref 4.0–10.5)

## 2014-05-04 LAB — MAGNESIUM: Magnesium: 1.9 mg/dL (ref 1.5–2.5)

## 2014-05-04 MED ORDER — ATORVASTATIN CALCIUM 80 MG PO TABS
80.0000 mg | ORAL_TABLET | ORAL | Status: DC
  Administered 2014-05-04: 80 mg via ORAL
  Filled 2014-05-04: qty 1

## 2014-05-04 MED ORDER — IRBESARTAN 75 MG PO TABS
75.0000 mg | ORAL_TABLET | Freq: Every day | ORAL | Status: DC
  Administered 2014-05-04 – 2014-05-05 (×2): 75 mg via ORAL
  Filled 2014-05-04 (×2): qty 1

## 2014-05-04 MED ORDER — NITROGLYCERIN 0.4 MG SL SUBL: 0.4000 mg | SUBLINGUAL_TABLET | SUBLINGUAL | Status: DC | PRN

## 2014-05-04 MED ORDER — DOCUSATE SODIUM 100 MG PO CAPS
100.0000 mg | ORAL_CAPSULE | Freq: Every day | ORAL | Status: DC
  Administered 2014-05-04 – 2014-05-05 (×2): 100 mg via ORAL
  Filled 2014-05-04 (×2): qty 1

## 2014-05-04 MED ORDER — ASPIRIN 81 MG PO CHEW
81.0000 mg | CHEWABLE_TABLET | Freq: Every day | ORAL | Status: DC
  Administered 2014-05-04 – 2014-05-05 (×2): 81 mg via ORAL
  Filled 2014-05-04 (×3): qty 1

## 2014-05-04 MED ORDER — ENSURE COMPLETE PO LIQD
237.0000 mL | Freq: Two times a day (BID) | ORAL | Status: DC
  Administered 2014-05-04 – 2014-05-05 (×3): 237 mL via ORAL

## 2014-05-04 NOTE — Progress Notes (Signed)
NUTRITION FOLLOW UP  Intervention:   - Ensure Complete po BID, each supplement provides 350 kcal and 13 grams of protein - Provided pt with handouts and brief education on a diet for pancreatitis. Used teach-back method.  Nutrition Dx:   Inadequate oral intake related to pancreatitis and abdominal pain as evidenced by energy intake < 75% for the past 3 months; ongoing  Goal:   Pt to meet >/= 90% of their estimated nutrition needs; not met  Monitor:   Weight trends, po intake, labs  Assessment:   PMH of HTN, CAD s/p stenting x2, laryngeal cancer s/p radiation therapy 2013, hyperlipidemia, presenting with pancreatitis, CBD dilatation. Pt transferred from [facility]. Awaiting improvement in lipase and pain prior to proceeding with surgical interventions.   7/31: Spoke with pt and his family. Pt reports he currently has abdominal pain, but it was gotten a little bit better. Pt reports he tried drinking apple juice earlier and reported he had immediate abdominal pain after just a couple of sips. Pt reports he has been having abdominal pain for the past 3 months especially following meals. Pt and wife report over the past 3 months he has been eating less and less due to his worsening abdominal pain. Pt reports he would eat 3 meals a day, but his meals would be about 25-50% less than his normal meals. He presented to the hospital Wednesday after having extreme stomach pains. He reports he has not eaten anything prior to coming into the hospital. Last full meal was lunch Wednesday. Pt reports currently not having an appetite. Pt reports having lost 12 lbs over the past 3 months with his usual body weight of 222 lbs. Pt with a 5% weight loss over the past 3 months.  Pt was educated that once his diet is advanced that an oral supplement is recommended to help him consume extra protein and calories. Pt expressed he may be willing to try. Will continue to monitor pt  8/4: - Pt underwent  laparoscopic cholecystectomy on 8/3 - Pt's diet advanced to full liquids.  - Expected to go home tomorrow. - Pt will be sent Ensure Complete to supplement calories and protein now that diet is advanced. - Pt had questions about foods that he should be eating and staying away from once at home, specifically at breakfast. Questions were answered.  Height: Ht Readings from Last 1 Encounters:  04/29/14 '5\' 8"'  (1.727 m)    Weight Status:   Wt Readings from Last 1 Encounters:  04/29/14 211 lb 11.2 oz (96.026 kg)    Re-estimated needs:  Kcal: 2000-2200 Protein: 120-130 g Fluid: 2.0-2.2 L/day  Skin: Closed incision on abdomen  Diet Order: Full Liquid   Intake/Output Summary (Last 24 hours) at 05/04/14 1310 Last data filed at 05/04/14 0830  Gross per 24 hour  Intake 2467.5 ml  Output    400 ml  Net 2067.5 ml    Last BM: 8/2   Labs:   Recent Labs Lab 05/02/14 0830 05/03/14 0607 05/04/14 0542  NA 138 141 143  K 3.5* 3.6* 4.6  CL 105 106 105  CO2 '21 23 23  ' BUN '10 10 16  ' CREATININE 0.66 0.80 0.82  CALCIUM 8.9 9.2 9.0  MG 1.7 1.9 1.9  GLUCOSE 112* 107* 131*    CBG (last 3)  No results found for this basename: GLUCAP,  in the last 72 hours  Scheduled Meds: . amLODipine  5 mg Oral Daily  . antiseptic oral rinse  7 mL Mouth Rinse BID  . aspirin  81 mg Oral Daily  . atorvastatin  80 mg Oral QODAY  . carvedilol  3.125 mg Oral BID WC  . chlorhexidine  15 mL Mouth Rinse BID  . clopidogrel  75 mg Oral Q breakfast  . docusate sodium  100 mg Oral Daily  . heparin subcutaneous  5,000 Units Subcutaneous 3 times per day  . irbesartan  75 mg Oral Daily  . pantoprazole  40 mg Oral Q0600    Continuous Infusions:   Terrace Arabia RD, LDN

## 2014-05-04 NOTE — Progress Notes (Signed)
Note findings of IOC.  No evidence of choledochal cyst.   Per Dr Ardis Hughs, pt does not require EUS or ERCP.   Mr Harrel can return to care of GI:  Dr Britta Mccreedy in [place].  If biliary expertise needed, Dr Ardis Hughs and Carlean Purl are happy to see pt  Azucena Freed PA-C.

## 2014-05-04 NOTE — Progress Notes (Signed)
Agree with above 

## 2014-05-04 NOTE — Progress Notes (Signed)
[facility] Progress Note  1 Day Post-Op  Subjective: Feels good, was a bit nauseated this am, but that's now resolved.  He tolerated clears.  +flatus, no BM.  Ambulating through the halls with a walker.  Incisional pain well controlled.  BM's and flatus.    Objective: Vital signs in last 24 hours: Temp:  [98 F (36.7 C)-98.9 F (37.2 C)] 98.4 F (36.9 C) (08/04 0429) Pulse Rate:  [62-77] 62 (08/04 0429) Resp:  [16-20] 18 (08/04 0429) BP: (109-166)/(56-75) 109/63 mmHg (08/04 0429) SpO2:  [87 %-93 %] 93 % (08/04 0429) Last BM Date: 05/02/14  Intake/Output from previous day: 08/03 0701 - 08/04 0700 In: 2654.2 [P.O.:480; I.V.:2174.2] Out: -  Intake/Output this shift:    PE: Gen:  Alert, NAD, pleasant Abd: Soft, Mild distension, mild tenderness over incision sites, +BS, no HSM, incisions C/D/I   Lab Results:   Recent Labs  05/03/14 0607 05/04/14 0542  WBC 7.6 7.7  HGB 12.4* 12.5*  HCT 36.5* 36.5*  PLT 216 228   BMET  Recent Labs  05/03/14 0607 05/04/14 0542  NA 141 143  K 3.6* 4.6  CL 106 105  CO2 23 23  GLUCOSE 107* 131*  BUN 10 16  CREATININE 0.80 0.82  CALCIUM 9.2 9.0   PT/INR No results found for this basename: LABPROT, INR,  in the last 72 hours CMP     Component Value Date/Time   NA 143 05/04/2014 0542   K 4.6 05/04/2014 0542   CL 105 05/04/2014 0542   CO2 23 05/04/2014 0542   GLUCOSE 131* 05/04/2014 0542   BUN 16 05/04/2014 0542   CREATININE 0.82 05/04/2014 0542   CALCIUM 9.0 05/04/2014 0542   PROT 5.9* 05/04/2014 0542   ALBUMIN 2.3* 05/04/2014 0542   AST 66* 05/04/2014 0542   ALT 46 05/04/2014 0542   ALKPHOS 107 05/04/2014 0542   BILITOT 0.6 05/04/2014 0542   GFRNONAA 85* 05/04/2014 0542   GFRAA >90 05/04/2014 0542   Lipase     Component Value Date/Time   LIPASE 25 05/03/2014 0607       Studies/Results: Dg Cholangiogram Operative  05/03/2014   CLINICAL DATA:  Cholecystectomy for cholelithiasis and pancreatitis.  EXAM: INTRAOPERATIVE  CHOLANGIOGRAM  TECHNIQUE: Cholangiographic images from the C-arm fluoroscopic device were submitted for interpretation post-operatively. Please see the procedural report for the amount of contrast and the fluoroscopy time utilized.  COMPARISON:  MRI and MRCP on 05/01/2014 as well as prior ultrasound and CT studies.  FINDINGS: Intraoperative imaging with a C-arm demonstrates a dilated common bile duct. There is some irregular filling defects in the distal common bile duct which may represent sludge. No discrete rounded filling defects are identified. Contrast does enter the duodenum. A small amount of contrast extravasation is seen at the injection site.  IMPRESSION: Dilated common bile duct with some irregular filling defects in the distal duct which may represent biliary sludge. No discrete rounded filling defects are identified   Electronically Signed   By: Aletta Edouard M.D.   On: 05/03/2014 17:35    Anti-infectives: Anti-infectives   Start     Dose/Rate Route Frequency Ordered Stop   05/03/14 2100  Ampicillin-Sulbactam (UNASYN) 3 g in sodium chloride 0.9 % 100 mL IVPB     3 g 100 mL/hr over 60 Minutes Intravenous Every 6 hours 05/03/14 1851 05/03/14 2138   04/30/14 1500  Ampicillin-Sulbactam (UNASYN) 3 g in sodium chloride 0.9 % 100 mL IVPB  Status:  Discontinued  3 g 100 mL/hr over 60 Minutes Intravenous Every 6 hours 04/30/14 1424 05/03/14 1851   04/29/14 2300  piperacillin-tazobactam (ZOSYN) IVPB 3.375 g  Status:  Discontinued     3.375 g 12.5 mL/hr over 240 Minutes Intravenous 3 times per day 04/29/14 2231 04/30/14 1251   04/29/14 2300  vancomycin (VANCOCIN) IVPB 1000 mg/200 mL premix  Status:  Discontinued     1,000 mg 200 mL/hr over 60 Minutes Intravenous Every 12 hours 04/29/14 2231 04/30/14 1301   04/29/14 2245  ampicillin-sulbactam (UNASYN) 1.5 g in sodium chloride 0.9 % 50 mL IVPB  Status:  Discontinued     1.5 g 100 mL/hr over 30 Minutes Intravenous Every 6 hours 04/29/14 2202  04/29/14 2214       Assessment/Plan Gallstone pancreatitis POD #1 s/p lap chole with IOC ?Choledochal cyst ?Dilated biliary ducts Chronic Cholecystitis  Ileus  CAD with DES to LAD  Plan:  1. Unasyn Day 5, d/c now 2. IVF, pain control, antiemetics prn 3. SCD's and heparin for DVT proph  4. Ambulate and IS  5. Advance diet as tolerated and if  6. Resumed on Plavix last night after surgery (but did not get it until this am) and resumed on Aspirin today 7. Will need to f/u with Dr. Redmond Pulling in 2-3 weeks and will need repeat MRCP in 4 weeks 8. GI may want to arrange ERCP and or EUS after discharge     LOS: 5 days    [name], [name] 05/04/2014, 7:59 AM Pager: [phone]

## 2014-05-04 NOTE — Progress Notes (Signed)
Patient complaining of shortness of breath and pain when he pulls up to a seated position with an onset over the past thirty minutes. Placed on 2L. VS stable. Patient does not appear to be in distress. MD notified and orders received.

## 2014-05-04 NOTE — Progress Notes (Addendum)
Patient Demographics  [name], is a 73 y.o. male, DOB - 07-12-1941, TKP:[id number]  Admit date - 04/29/2014   Admitting Physician Shanda Howells, MD  Outpatient Primary MD for the patient is VYAS,DHRUV B., MD  LOS - 5   No chief complaint on file.     Brief summary.  73 year old Caucasian male with history of CAD recent drug-eluting stent to RCA 6-1/2 months ago, came in with gallstone pancreatitis, nonspecific findings on MRCP, seen by GI and general surgery. After much deliberation he was operated and underwent laparoscopic cholecystectomy on 05/03/2014, GI has signed off. Patient was on aspirin Plavix due to recent stent which was held, this has been resumed today.  Offered him to be discharged today, patient and wife both want another day hospital stay as he has not had anything by mouth, feels weak and has developed mild urinary retention as well.    Subjective:   [name] today has, No headache, No chest pain, mild epigastric and RUQ Pain - No Nausea, No new weakness tingling or numbness, No Cough - SOB.    Assessment & Plan    1. Gastric abdominal pain with findings consistent of CBD dilation, gallbladder wall thickening, intrahepatic biliary dilation and evidence of pancreatitis on CT scan at [facility]. [facility] prior to admission and was due to undergo ERCP but family chose to get him transferred here.    Workup suggested gallstone acute pancreatitis, GI & CCS,  post MRCP with no CBD stone, ?CBD cyst, underwent laparoscopic cholecystectomy 05/03/2014, per GI no ERCP or further evaluation of CBD needed. We'll advance diet and activity and monitor.    2. CAD with RCA drug-eluting stent placed in December of 2014. Aspirin and Plavix been  resumed, continue Coreg and statin. Seen by cardiology here.    3. Dyslipidemia - resume statin.    4. Hypertension. Oral Norvasc and Coreg onboard. ARB reordered.    5. GERD. PO PPI.    6.  H/O laryngeal cancer, treated with radiation therapy - outpatient followup no acute issues.    7. Mild urinary retention. Much improved after ambulating and sitting up, has wanted 400 cc, residual 150. We'll continue to monitor closely.     Code Status: Full  Family Communication: Daughter  Disposition Plan: TBD   Procedures     Consults  GI, CCS, Cards   Medications  Scheduled Meds: . amLODipine  5 mg Oral Daily  . antiseptic oral rinse  7 mL Mouth Rinse BID  . aspirin  81 mg Oral Daily  . carvedilol  3.125 mg Oral BID WC  . chlorhexidine  15 mL Mouth Rinse BID  . clopidogrel  75 mg Oral Q breakfast  . heparin subcutaneous  5,000 Units Subcutaneous 3 times per day  . pantoprazole  40 mg Oral Q0600   Continuous Infusions:   PRN Meds:.acetaminophen, fluticasone, HYDROcodone-acetaminophen, HYDROmorphone (DILAUDID) injection, LORazepam, morphine injection, ondansetron (ZOFRAN) IV  DVT Prophylaxis   Heparin    Lab Results  Component Value Date   PLT 228 05/04/2014    Antibiotics     Anti-infectives   Start     Dose/Rate Route Frequency Ordered Stop   05/03/14 2100  Ampicillin-Sulbactam (UNASYN) 3 g in sodium chloride 0.9 % 100 mL IVPB     3 g 100 mL/hr over 60 Minutes Intravenous Every 6 hours 05/03/14 1851 05/03/14 2138   04/30/14 1500  Ampicillin-Sulbactam (UNASYN) 3 g in sodium chloride 0.9 % 100 mL IVPB  Status:  Discontinued     3 g 100 mL/hr over 60 Minutes Intravenous Every 6 hours 04/30/14 1424 05/03/14 1851   04/29/14 2300  piperacillin-tazobactam (ZOSYN) IVPB 3.375 g  Status:  Discontinued     3.375 g 12.5 mL/hr over 240 Minutes Intravenous 3 times per day 04/29/14 2231 04/30/14 1251   04/29/14 2300  vancomycin (VANCOCIN) IVPB 1000 mg/200 mL premix   Status:  Discontinued     1,000 mg 200 mL/hr over 60 Minutes Intravenous Every 12 hours 04/29/14 2231 04/30/14 1301   04/29/14 2245  ampicillin-sulbactam (UNASYN) 1.5 g in sodium chloride 0.9 % 50 mL IVPB  Status:  Discontinued     1.5 g 100 mL/hr over 30 Minutes Intravenous Every 6 hours 04/29/14 2202 04/29/14 2214          Objective:   Filed Vitals:   05/03/14 1855 05/03/14 2015 05/04/14 0429 05/04/14 0825  BP: 135/73 146/69 109/63 171/69  Pulse: 69 69 62 69  Temp: 98 F (36.7 C) 98 F (36.7 C) 98.4 F (36.9 C)   TempSrc: Oral Oral Oral   Resp: 18 18 18    Height:      Weight:      SpO2: 90% 92% 93%     Wt Readings from Last 3 Encounters:  04/29/14 96.026 kg (211 lb 11.2 oz)  04/29/14 96.026 kg (211 lb 11.2 oz)  02/12/14 96.616 kg (213 lb)     Intake/Output Summary (Last 24 hours) at 05/04/14 1102 Last data filed at 05/04/14 0830  Gross per 24 hour  Intake 2467.5 ml  Output    400 ml  Net 2067.5 ml     Physical Exam  Awake Alert, Oriented X 3, No new F.N deficits, Normal affect [place].AT,PERRAL Supple Neck,No JVD, No cervical lymphadenopathy appriciated.  Symmetrical Chest wall movement, Good air movement bilaterally, CTAB RRR,No Gallops,Rubs or new Murmurs, No Parasternal Heave +ve B.Sounds, Abd Soft, mild but positive epigastric tenderness, No organomegaly appriciated, No rebound - guarding or rigidity. No Cyanosis, Clubbing or edema, No new Rash or bruise      Data Review   Micro Results Recent Results (from the past 240 hour(s))  CULTURE, BLOOD (ROUTINE X 2)     Status: None   Collection Time    04/29/14 11:25 PM      Result Value Ref Range Status   Specimen Description BLOOD RIGHT ANTECUBITAL   Final   Special Requests     Final   Value: BOTTLES DRAWN AEROBIC AND ANAEROBIC AER 10CC ANA 5CC   Culture  Setup Time     Final   Value: 04/30/2014 03:39     Performed at [facility]   Culture     Final   Value:        BLOOD CULTURE RECEIVED NO  GROWTH TO DATE CULTURE WILL BE HELD FOR 5 DAYS BEFORE ISSUING A FINAL NEGATIVE REPORT     Performed at [facility]   Report Status PENDING   Incomplete  CULTURE, BLOOD (ROUTINE X 2)     Status: None   Collection Time    04/29/14 11:38 PM      Result Value Ref Range Status   Specimen  Description BLOOD RIGHT HAND   Final   Special Requests     Final   Value: BOTTLES DRAWN AEROBIC AND ANAEROBIC AER 10CC ANA [place]   Culture  Setup Time     Final   Value: 04/30/2014 03:39     Performed at [facility]   Culture     Final   Value:        BLOOD CULTURE RECEIVED NO GROWTH TO DATE CULTURE WILL BE HELD FOR 5 DAYS BEFORE ISSUING A FINAL NEGATIVE REPORT     Performed at [facility]   Report Status PENDING   Incomplete  SURGICAL PCR SCREEN     Status: None   Collection Time    05/02/14  1:10 PM      Result Value Ref Range Status   MRSA, PCR NEGATIVE  NEGATIVE Final   Staphylococcus aureus NEGATIVE  NEGATIVE Final   Comment:            The Xpert SA Assay (FDA     approved for NASAL specimens     in patients over 36 years of age),     is one component of     a comprehensive surveillance     program.  Test performance has     been validated by [employer] for patients greater     than or equal to 5 year old.     It is not intended     to diagnose infection nor to     guide or monitor treatment.    Radiology Reports No results found.  CBC  Recent Labs Lab 04/29/14 2325 05/01/14 0600 05/02/14 0445 05/03/14 0607 05/04/14 0542  WBC 9.1 9.6 8.1 7.6 7.7  HGB 13.2 13.0 12.0* 12.4* 12.5*  HCT 39.1 39.2 35.6* 36.5* 36.5*  PLT 191 175 197 216 228  MCV 86.5 88.3 85.6 84.5 86.9  MCH 29.2 29.3 28.8 28.7 29.8  MCHC 33.8 33.2 33.7 34.0 34.2  RDW 14.2 14.2 14.1 14.1 14.4    Chemistries   Recent Labs Lab 04/30/14 0950 05/01/14 0600 05/02/14 0830 05/03/14 0607 05/04/14 0542  NA 140 137 138 141 143  K 3.7 4.0 3.5* 3.6* 4.6  CL 104 105 105 106 105  CO2  23 24 21 23 23   GLUCOSE 87 97 112* 107* 131*  BUN 16 14 10 10 16   CREATININE 0.94 0.83 0.66 0.80 0.82  CALCIUM 8.9 8.9 8.9 9.2 9.0  MG  --   --  1.7 1.9 1.9  AST 74* 40* 28 46* 66*  ALT 97* 60* 42 39 46  ALKPHOS 156* 127* 112 118* 107  BILITOT 3.2* 1.3* 0.9 0.9 0.6   ------------------------------------------------------------------------------------------------------------------ estimated creatinine clearance is 88.8 ml/min (by C-G formula based on Cr of 0.82). ------------------------------------------------------------------------------------------------------------------ No results found for this basename: HGBA1C,  in the last 72 hours ------------------------------------------------------------------------------------------------------------------ No results found for this basename: CHOL, HDL, LDLCALC, TRIG, CHOLHDL, LDLDIRECT,  in the last 72 hours ------------------------------------------------------------------------------------------------------------------ No results found for this basename: TSH, T4TOTAL, FREET3, T3FREE, THYROIDAB,  in the last 72 hours ------------------------------------------------------------------------------------------------------------------ No results found for this basename: VITAMINB12, FOLATE, FERRITIN, TIBC, IRON, RETICCTPCT,  in the last 72 hours  Coagulation profile  Recent Labs Lab 04/30/14 0950  INR 1.30    No results found for this basename: DDIMER,  in the last 72 hours  Cardiac Enzymes No results found for this basename: CK, CKMB, TROPONINI, MYOGLOBIN,  in the last 168 hours ------------------------------------------------------------------------------------------------------------------ No  components found with this basename: POCBNP,      Time Spent in minutes   35   Sy Saintjean K M.D on 05/04/2014 at 11:02 AM  Between 7am to 7pm - Pager - [phone]  After 7pm go to [email or web address] - password [email or web address]  And look for the  night coverage person covering for me after hours  [facility] Office  [phone]   **Disclaimer: This note may have been dictated with voice recognition software. Similar sounding words can inadvertently be transcribed and this note may contain transcription errors which may not have been corrected upon publication of note.**

## 2014-05-04 NOTE — Progress Notes (Signed)
Looks good Ok for [employer] from my standpoint  Discussed surgical findings with pt and wife Will see in office in several weeks and discuss potential repeat MRCP  Leighton [name]. Redmond Pulling, MD, FACS General, Bariatric, & Minimally Invasive Surgery [facility], [place]

## 2014-05-04 NOTE — Telephone Encounter (Signed)
Pt is scheduled for post op appt with Dr. Redmond Pulling on 05/26/14 at 8:30 a.m.  He will need an MRCP scheduled two weeks after appointment.  Order in [place] and will contact patient when scheduled.

## 2014-05-04 NOTE — Progress Notes (Signed)
Patient refuses CPAP due to discomfort.  BBS were clear and diminished.

## 2014-05-05 ENCOUNTER — Inpatient Hospital Stay (HOSPITAL_COMMUNITY): Payer: Medicare Other

## 2014-05-05 LAB — COMPREHENSIVE METABOLIC PANEL
ALT: 50 U/L (ref 0–53)
AST: 56 U/L — AB (ref 0–37)
Albumin: 2.2 g/dL — ABNORMAL LOW (ref 3.5–5.2)
Alkaline Phosphatase: 91 U/L (ref 39–117)
Anion gap: 11 (ref 5–15)
BILIRUBIN TOTAL: 0.7 mg/dL (ref 0.3–1.2)
BUN: 18 mg/dL (ref 6–23)
CHLORIDE: 105 meq/L (ref 96–112)
CO2: 25 meq/L (ref 19–32)
Calcium: 8.7 mg/dL (ref 8.4–10.5)
Creatinine, Ser: 0.74 mg/dL (ref 0.50–1.35)
GFR calc non Af Amer: 89 mL/min — ABNORMAL LOW (ref >90)
Glucose, Bld: 108 mg/dL — ABNORMAL HIGH (ref 70–99)
Potassium: 4 mEq/L (ref 3.7–5.3)
Sodium: 141 mEq/L (ref 137–147)
Total Protein: 5.6 g/dL — ABNORMAL LOW (ref 6.0–8.3)

## 2014-05-05 LAB — CBC
HCT: 35.5 % — ABNORMAL LOW (ref 39.0–52.0)
Hemoglobin: 11.9 g/dL — ABNORMAL LOW (ref 13.0–17.0)
MCH: 29 pg (ref 26.0–34.0)
MCHC: 33.5 g/dL (ref 30.0–36.0)
MCV: 86.4 fL (ref 78.0–100.0)
PLATELETS: 280 10*3/uL (ref 150–400)
RBC: 4.11 MIL/uL — AB (ref 4.22–5.81)
RDW: 14.5 % (ref 11.5–15.5)
WBC: 7 10*3/uL (ref 4.0–10.5)

## 2014-05-05 LAB — MAGNESIUM: Magnesium: 2.1 mg/dL (ref 1.5–2.5)

## 2014-05-05 MED ORDER — BISACODYL 10 MG RE SUPP
10.0000 mg | Freq: Once | RECTAL | Status: AC
  Administered 2014-05-05: 10 mg via RECTAL
  Filled 2014-05-05: qty 1

## 2014-05-05 MED ORDER — SIMETHICONE 80 MG PO CHEW
80.0000 mg | CHEWABLE_TABLET | Freq: Four times a day (QID) | ORAL | Status: DC | PRN
  Filled 2014-05-05: qty 1

## 2014-05-05 MED ORDER — CARVEDILOL 3.125 MG PO TABS: 3.1250 mg | ORAL_TABLET | Freq: Two times a day (BID) | ORAL | Status: DC

## 2014-05-05 NOTE — Progress Notes (Signed)
[facility] Progress Note  2 Days Post-Op  Subjective: Feels good, was a bit nauseated and c/o pain last night and was kept overnight. He tolerated regular diet. +flatus, no BM. Ambulating through the halls with a walker. Incisional pain well controlled.    Objective: Vital signs in last 24 hours: Temp:  [97.9 F (36.6 C)-98.7 F (37.1 C)] 97.9 F (36.6 C) (08/05 0443) Pulse Rate:  [58-75] 58 (08/05 0443) Resp:  [16-18] 16 (08/05 0443) BP: (115-171)/(52-71) 123/66 mmHg (08/05 0443) SpO2:  [91 %-94 %] 92 % (08/05 0443) Last BM Date: 05/03/14  Intake/Output from previous day: 08/04 0701 - 08/05 0700 In: 677 [P.O.:677] Out: 400 [Urine:400] Intake/Output this shift:    PE: Gen: Alert, NAD, pleasant  Abd: Soft, Mild distension, mild tenderness over incision sites, +BS, no HSM, incisions C/D/I   Lab Results:   Recent Labs  05/04/14 0542 05/05/14 0608  WBC 7.7 7.0  HGB 12.5* 11.9*  HCT 36.5* 35.5*  PLT 228 280   BMET  Recent Labs  05/04/14 0542 05/05/14 0608  NA 143 141  K 4.6 4.0  CL 105 105  CO2 23 25  GLUCOSE 131* 108*  BUN 16 18  CREATININE 0.82 0.74  CALCIUM 9.0 8.7   PT/INR No results found for this basename: LABPROT, INR,  in the last 72 hours CMP     Component Value Date/Time   NA 141 05/05/2014 0608   K 4.0 05/05/2014 0608   CL 105 05/05/2014 0608   CO2 25 05/05/2014 0608   GLUCOSE 108* 05/05/2014 0608   BUN 18 05/05/2014 0608   CREATININE 0.74 05/05/2014 0608   CALCIUM 8.7 05/05/2014 0608   PROT 5.6* 05/05/2014 0608   ALBUMIN 2.2* 05/05/2014 0608   AST 56* 05/05/2014 0608   ALT 50 05/05/2014 0608   ALKPHOS 91 05/05/2014 0608   BILITOT 0.7 05/05/2014 0608   GFRNONAA 89* 05/05/2014 0608   GFRAA >90 05/05/2014 0608   Lipase     Component Value Date/Time   LIPASE 25 05/03/2014 0607       Studies/Results: Dg Cholangiogram Operative  05/03/2014   CLINICAL DATA:  Cholecystectomy for cholelithiasis and pancreatitis.  EXAM: INTRAOPERATIVE CHOLANGIOGRAM   TECHNIQUE: Cholangiographic images from the C-arm fluoroscopic device were submitted for interpretation post-operatively. Please see the procedural report for the amount of contrast and the fluoroscopy time utilized.  COMPARISON:  MRI and MRCP on 05/01/2014 as well as prior ultrasound and CT studies.  FINDINGS: Intraoperative imaging with a C-arm demonstrates a dilated common bile duct. There is some irregular filling defects in the distal common bile duct which may represent sludge. No discrete rounded filling defects are identified. Contrast does enter the duodenum. A small amount of contrast extravasation is seen at the injection site.  IMPRESSION: Dilated common bile duct with some irregular filling defects in the distal duct which may represent biliary sludge. No discrete rounded filling defects are identified   Electronically Signed   By: Aletta Edouard M.D.   On: 05/03/2014 17:35    Anti-infectives: Anti-infectives   Start     Dose/Rate Route Frequency Ordered Stop   05/03/14 2100  Ampicillin-Sulbactam (UNASYN) 3 g in sodium chloride 0.9 % 100 mL IVPB     3 g 100 mL/hr over 60 Minutes Intravenous Every 6 hours 05/03/14 1851 05/03/14 2138   04/30/14 1500  Ampicillin-Sulbactam (UNASYN) 3 g in sodium chloride 0.9 % 100 mL IVPB  Status:  Discontinued     3 g  100 mL/hr over 60 Minutes Intravenous Every 6 hours 04/30/14 1424 05/03/14 1851   04/29/14 2300  piperacillin-tazobactam (ZOSYN) IVPB 3.375 g  Status:  Discontinued     3.375 g 12.5 mL/hr over 240 Minutes Intravenous 3 times per day 04/29/14 2231 04/30/14 1251   04/29/14 2300  vancomycin (VANCOCIN) IVPB 1000 mg/200 mL premix  Status:  Discontinued     1,000 mg 200 mL/hr over 60 Minutes Intravenous Every 12 hours 04/29/14 2231 04/30/14 1301   04/29/14 2245  ampicillin-sulbactam (UNASYN) 1.5 g in sodium chloride 0.9 % 50 mL IVPB  Status:  Discontinued     1.5 g 100 mL/hr over 30 Minutes Intravenous Every 6 hours 04/29/14 2202 04/29/14 2214        Assessment/Plan Gallstone pancreatitis POD #2 s/p lap chole with IOC  ?Choledochal cyst  ?Dilated biliary ducts  Chronic Cholecystitis  Ileus  CAD with DES to LAD   Plan:  1. D/c'ed unasyn, WBC normal today and afebrile 2. IVF, pain control, antiemetics prn  3. SCD's and heparin for DVT proph  4. Ambulate and IS  5. Tolerating reg diet 6. Resumed on Plavix last night after surgery (but did not get it until the am after) and resumed on Aspirin yesterday 7. Will need to f/u with Dr. Redmond Pulling in 2-3 weeks and will need repeat MRCP in 4 weeks  8. GI may want to arrange ERCP and or EUS after discharge 9. D/c home from our perspective    LOS: 6 days    DORT, Jinny Blossom 05/05/2014, 7:59 AM Pager: [phone]

## 2014-05-05 NOTE — Evaluation (Signed)
Physical Therapy Evaluation and discharge Patient Details Name: [name] MRN: [id number] DOB: 03-05-1941 Today's Date: 05/05/2014   History of Present Illness  Pt is s/p lap-chole due to pancreatitis and gall stones.  Clinical Impression  Pt admitted with above.  He is MOD I with all aspects of mobility at this time with RW.  Recommend RW for home use initially due to complaints of chronic knee problems.  Do not feel he needs any follow up PT at home. Will d/c from acute PT services.    Follow Up Recommendations No PT follow up    Equipment Recommendations  Rolling walker with 5" wheels    Recommendations for Other Services       Precautions / Restrictions        Mobility  Bed Mobility               General bed mobility comments: Pt in bathroom upon arrival  Transfers Overall transfer level: Modified independent Equipment used: Rolling walker (2 wheeled)             General transfer comment: Pt MOD I with transfers in bathroom.  Pt reports toilet in his bathroom is taller.  Ambulation/Gait Ambulation/Gait assistance: Modified independent (Device/Increase time) Ambulation Distance (Feet): 550 Feet Assistive device: Rolling walker (2 wheeled) Gait Pattern/deviations: Decreased stance time - left;Antalgic     General Gait Details: Slight antalgic gait pattern due to chronic knee issues and feeling it is more swollen today.  Stairs            Wheelchair Mobility    Modified Rankin (Stroke Patients Only)       Balance Overall balance assessment: Modified Independent                                           Pertinent Vitals/Pain 1/10 gas and some chronic arthritis in knee    Home Living Family/patient expects to be discharged to:: Private residence Living Arrangements: Spouse/significant other Available Help at Discharge: Family Type of Home: House Home Access: Stairs to enter Entrance Stairs-Rails: Left;Right;Can  reach both Technical brewer of Steps: 5 Home Layout: One level Home Equipment: Cane - single point;Electric scooter      Prior Function Level of Independence: Independent with assistive device(s)         Comments: Amb with cane and uses scooter for long distances.     Hand Dominance        Extremity/Trunk Assessment   Upper Extremity Assessment: Overall WFL for tasks assessed           Lower Extremity Assessment: Overall WFL for tasks assessed      Cervical / Trunk Assessment: Normal  Communication   Communication: No difficulties  Cognition Arousal/Alertness: Awake/alert Behavior During Therapy: WFL for tasks assessed/performed Overall Cognitive Status: Within Functional Limits for tasks assessed                      General Comments General comments (skin integrity, edema, etc.): Pt MOD I with washing hands at sink with no UE support.    Exercises Other Exercises Other Exercises: reviewed LE HEP with pt including marching, hip abd, knee ext and ankle pumps      Assessment/Plan    PT Assessment Patent does not need any further PT services  PT Diagnosis     PT Problem List  PT Treatment Interventions     PT Goals (Current goals can be found in the Care Plan section) Acute Rehab PT Goals PT Goal Formulation: No goals set, d/c therapy    Frequency     Barriers to discharge        Co-evaluation               End of Session Equipment Utilized During Treatment: Gait belt Activity Tolerance: Patient tolerated treatment well Patient left: in chair Nurse Communication: Mobility status         Time: 5631-4970 PT Time Calculation (min): 20 min   Charges:   PT Evaluation $Initial PT Evaluation Tier I: 1 Procedure PT Treatments $Gait Training: 8-22 mins   PT G Codes:          [name] 05/05/2014, 1:25 PM

## 2014-05-05 NOTE — Progress Notes (Signed)
Nsg Discharge Note  Admit Date:  04/29/2014 Discharge date: 05/05/2014   AREN PRYDE to be D/C'd Home per MD order.  AVS completed.  Copy for chart, and copy for patient signed, and dated. Patient/caregiver able to verbalize understanding.  Discharge Medication:   Medication List         amLODipine 5 MG tablet  Commonly known as:  NORVASC  Take 1 tablet (5 mg total) by mouth daily.     aspirin EC 81 MG tablet  Take 1 tablet (81 mg total) by mouth daily.     atorvastatin 80 MG tablet  Commonly known as:  LIPITOR  Take 80 mg by mouth every other day.     carvedilol 3.125 MG tablet  Commonly known as:  COREG  Take 1 tablet (3.125 mg total) by mouth 2 (two) times daily with a meal.     cetirizine 10 MG tablet  Commonly known as:  ZYRTEC  Take 10 mg by mouth daily as needed for allergies.     clopidogrel 75 MG tablet  Commonly known as:  PLAVIX  Take 1 tablet (75 mg total) by mouth daily with breakfast.     dexlansoprazole 60 MG capsule  Commonly known as:  DEXILANT  Take 60 mg by mouth daily.     docusate sodium 100 MG capsule  Commonly known as:  COLACE  Take 100 mg by mouth daily.     fluticasone 50 MCG/ACT nasal spray  Commonly known as:  FLONASE  Place 2 sprays into the nose daily as needed for rhinitis.     LORazepam 1 MG tablet  Commonly known as:  ATIVAN  Take 1 mg by mouth at bedtime as needed for sleep.     nitroGLYCERIN 0.4 MG SL tablet  Commonly known as:  NITROSTAT  Place 0.4 mg under the tongue every 5 (five) minutes as needed for chest pain.     tetrahydrozoline 0.05 % ophthalmic solution  Place 1 drop into both eyes 2 (two) times daily as needed (dry eyes).     valsartan 40 MG tablet  Commonly known as:  DIOVAN  Take 20-40 mg by mouth daily. 40mg  in the am and 20mg  in the pm        Discharge Assessment: Filed Vitals:   05/05/14 1413  BP: 139/56  Pulse: 60  Temp: 97.8 F (36.6 C)  Resp: 16   Skin clean, dry and intact without  evidence of skin break down, no evidence of skin tears noted. May refer to doc flowsheets. IV catheter discontinued intact. Site without signs and symptoms of complications - no redness or edema noted at insertion site, patient denies c/o pain - only slight tenderness at site.  Dressing with slight pressure applied.  D/c Instructions-Education: Discharge instructions given to patient/family with verbalized understanding. D/c education completed with patient/family including follow up instructions, medication list, d/c activities limitations if indicated, with other d/c instructions as indicated by MD - patient able to verbalize understanding, all questions fully answered. Patient instructed to return to ED, call 911, or call MD for any changes in condition.  Patient escorted via [place], and D/C home via private auto.  Dayle Points, RN 05/05/2014 7:39 PM

## 2014-05-05 NOTE — Discharge Summary (Signed)
Physician Discharge Summary  [name] SWH:[id number] DOB: 27-Apr-1941 DOA: 04/29/2014  PCP: Glenda Chroman., MD  Admit date: 04/29/2014 Discharge date: 05/05/2014  Time spent: 30 minutes  Recommendations for Outpatient Follow-up:  1. Follow up with surgery as recommended.  Discharge Diagnoses:  Active Problems:   CAD (coronary artery disease), history of PTCA only of LCX in 2008 and 50% stenosis of RCA at that time and 50% LAD,    HTN (hypertension)   Hyperlipemia   H/O laryngeal cancer, treated with radiation therapy   Pancreatitis   Common bile duct dilatation   Preoperative clearance   Dilated bile duct   Discharge Condition: improved.   Diet recommendation: low fat diet.   Filed Weights   04/29/14 2201  Weight: 96.026 kg (211 lb 11.2 oz)    History of present illness:  73 year old Caucasian male with history of CAD recent drug-eluting stent to RCA 6-1/2 months ago, came in with gallstone pancreatitis, nonspecific findings on MRCP, seen by GI and general surgery. After much deliberation he was operated and underwent laparoscopic cholecystectomy on 05/03/2014, GI has signed off. Patient was on aspirin Plavix due to recent stent which was held, this has been resumed after surgery.      Hospital Course:  1. Gastric abdominal pain with findings consistent of CBD dilation, gallbladder wall thickening, intrahepatic biliary dilation and evidence of pancreatitis on CT scan at [facility]. Patient was at [facility] for the last 3 days prior to admission and was due to undergo ERCP but family chose to get him transferred here.  Workup suggested gallstone acute pancreatitis, GI & CCS, post MRCP with no CBD stone, ?CBD cyst, underwent laparoscopic cholecystectomy 05/03/2014, per GI no ERCP or further evaluation of CBD needed. He has tolerated diet.   2. CAD with RCA drug-eluting stent placed in December of 2014. Aspirin and Plavix been resumed, continue Coreg and statin. Seen by  cardiology here.  3. Dyslipidemia - resume statin.  4. Hypertension. Oral Norvasc and Coreg onboard. ARB reordered.  5. GERD. PO PPI.  6. H/O laryngeal cancer, treated with radiation therapy - outpatient followup no acute issues.  7. Mild urinary retention. RESOLVED.      Procedures:  S/P LAP CHOLE WITH IOC  Consultations:  Surgery  GI  Discharge Exam: Filed Vitals:   05/05/14 1413  BP: 139/56  Pulse: 60  Temp: 97.8 F (36.6 C)  Resp: 16    General: alert afebrile comfortable Cardiovascular: s1s2 Respiratory: ctab  Discharge Instructions You were cared for by a hospitalist during your hospital stay. If you have any questions about your discharge medications or the care you received while you were in the hospital after you are discharged, you can call the unit and asked to speak with the hospitalist on call if the hospitalist that took care of you is not available. Once you are discharged, your primary care physician will handle any further medical issues. Please note that NO REFILLS for any discharge medications will be authorized once you are discharged, as it is imperative that you return to your primary care physician (or establish a relationship with a primary care physician if you do not have one) for your aftercare needs so that they can reassess your need for medications and monitor your lab values.  Discharge Instructions   Discharge instructions    Complete by:  As directed   Follow up with PCP in one week.            Medication List  amLODipine 5 MG tablet  Commonly known as:  NORVASC  Take 1 tablet (5 mg total) by mouth daily.     aspirin EC 81 MG tablet  Take 1 tablet (81 mg total) by mouth daily.     atorvastatin 80 MG tablet  Commonly known as:  LIPITOR  Take 80 mg by mouth every other day.     carvedilol 3.125 MG tablet  Commonly known as:  COREG  Take 1 tablet (3.125 mg total) by mouth 2 (two) times daily with a meal.      cetirizine 10 MG tablet  Commonly known as:  ZYRTEC  Take 10 mg by mouth daily as needed for allergies.     clopidogrel 75 MG tablet  Commonly known as:  PLAVIX  Take 1 tablet (75 mg total) by mouth daily with breakfast.     dexlansoprazole 60 MG capsule  Commonly known as:  DEXILANT  Take 60 mg by mouth daily.     docusate sodium 100 MG capsule  Commonly known as:  COLACE  Take 100 mg by mouth daily.     fluticasone 50 MCG/ACT nasal spray  Commonly known as:  FLONASE  Place 2 sprays into the nose daily as needed for rhinitis.     LORazepam 1 MG tablet  Commonly known as:  ATIVAN  Take 1 mg by mouth at bedtime as needed for sleep.     nitroGLYCERIN 0.4 MG SL tablet  Commonly known as:  NITROSTAT  Place 0.4 mg under the tongue every 5 (five) minutes as needed for chest pain.     tetrahydrozoline 0.05 % ophthalmic solution  Place 1 drop into both eyes 2 (two) times daily as needed (dry eyes).     valsartan 40 MG tablet  Commonly known as:  DIOVAN  Take 20-40 mg by mouth daily. 40mg  in the am and 20mg  in the pm       No Known Allergies     Follow-up Information   Follow up with VYAS,DHRUV B., MD. Schedule an appointment as soon as possible for a visit in 1 week.   Specialty:  Internal Medicine   Contact information:   [place] [address] [phone]       Follow up with Gayland Curry, MD. Schedule an appointment as soon as possible for a visit in 2 weeks.   Specialty:  General Surgery   Contact information:   [place] [address] [phone]       Follow up with Cathe Mons, MD. (As needed)    Specialty:  Internal Medicine   Contact information:   [address] [place] [address] [phone]        The results of significant diagnostics from this hospitalization (including imaging, microbiology, ancillary and laboratory) are listed below for reference.    Significant Diagnostic Studies: Dg  Cholangiogram Operative  05/03/2014   CLINICAL DATA:  Cholecystectomy for cholelithiasis and pancreatitis.  EXAM: INTRAOPERATIVE CHOLANGIOGRAM  TECHNIQUE: Cholangiographic images from the C-arm fluoroscopic device were submitted for interpretation post-operatively. Please see the procedural report for the amount of contrast and the fluoroscopy time utilized.  COMPARISON:  MRI and MRCP on 05/01/2014 as well as prior ultrasound and CT studies.  FINDINGS: Intraoperative imaging with a C-arm demonstrates a dilated common bile duct. There is some irregular filling defects in the distal common bile duct which may represent sludge. No discrete rounded filling defects are identified. Contrast  does enter the duodenum. A small amount of contrast extravasation is seen at the injection site.  IMPRESSION: Dilated common bile duct with some irregular filling defects in the distal duct which may represent biliary sludge. No discrete rounded filling defects are identified   Electronically Signed   By: Aletta Edouard M.D.   On: 05/03/2014 17:35   Mr 3d Recon At Scanner  05/01/2014   CLINICAL DATA:  Dilated CBD on CT and ultrasound, pancreatitis  EXAM: MRI ABDOMEN WITHOUT AND WITH CONTRAST (INCLUDING MRCP)  TECHNIQUE: Multiplanar multisequence MR imaging of the abdomen was performed both before and after the administration of intravenous contrast. Heavily T2-weighted images of the biliary and pancreatic ducts were obtained, and three-dimensional MRCP images were rendered by post processing.  CONTRAST:  72mL MULTIHANCE GADOBENATE DIMEGLUMINE 529 MG/ML IV SOLN  COMPARISON:  [facility] abdominal ultrasound dated 04/29/2014 and CT abdomen pelvis dated 04/28/2014.  FINDINGS: Motion degraded images.  Small to moderate right and small left pleural effusions. Associated bilateral lower lobe atelectasis.  Liver is within normal limits. No suspicious/enhancing hepatic lesions. No hepatic steatosis.  Spleen and adrenal glands are within normal  limits.  Peripancreatic inflammatory changes/fluid along the body/tail (series 10/ image 17), compatible with known acute pancreatitis. No associated dilatation of the main pancreatic duct. No peripancreatic fluid collection/pseudocyst.  Suspected sludge in the gallbladder neck (series 4/image 24). No gallstones, gallbladder wall thickening, or pericholecystic fluid.  Proximal common duct is dilated, measuring 11 mm (series 4/ image 22). Fusiform dilatation of the mid common duct, measuring up to 16 mm (series 4/image 22), suggesting a type 1 choledochal cyst. Smooth tapering of the distal common duct, measuring 8 mm (series 4/ image 23). No evidence of choledocholithiasis.  Tiny left lower pole renal cyst (series 6/ image 40). Kidneys are otherwise within normal limits. No hydronephrosis.  No suspicious abdominal lymphadenopathy.  No focal osseous lesions.  IMPRESSION: Motion degraded images.  Acute pancreatitis, without evidence of complication.  Gallbladder sludge, without associated inflammatory changes.  Dilated common duct with possible type 1 choledochal cyst. No evidence of choledocholithiasis.   Electronically Signed   By: Julian Hy M.D.   On: 05/01/2014 17:19   Mr Abd W/wo Cm/mrcp  05/01/2014   CLINICAL DATA:  Dilated CBD on CT and ultrasound, pancreatitis  EXAM: MRI ABDOMEN WITHOUT AND WITH CONTRAST (INCLUDING MRCP)  TECHNIQUE: Multiplanar multisequence MR imaging of the abdomen was performed both before and after the administration of intravenous contrast. Heavily T2-weighted images of the biliary and pancreatic ducts were obtained, and three-dimensional MRCP images were rendered by post processing.  CONTRAST:  69mL MULTIHANCE GADOBENATE DIMEGLUMINE 529 MG/ML IV SOLN  COMPARISON:  [facility] abdominal ultrasound dated 04/29/2014 and CT abdomen pelvis dated 04/28/2014.  FINDINGS: Motion degraded images.  Small to moderate right and small left pleural effusions. Associated bilateral lower lobe  atelectasis.  Liver is within normal limits. No suspicious/enhancing hepatic lesions. No hepatic steatosis.  Spleen and adrenal glands are within normal limits.  Peripancreatic inflammatory changes/fluid along the body/tail (series 10/ image 17), compatible with known acute pancreatitis. No associated dilatation of the main pancreatic duct. No peripancreatic fluid collection/pseudocyst.  Suspected sludge in the gallbladder neck (series 4/image 24). No gallstones, gallbladder wall thickening, or pericholecystic fluid.  Proximal common duct is dilated, measuring 11 mm (series 4/ image 22). Fusiform dilatation of the mid common duct, measuring up to 16 mm (series 4/image 22), suggesting a type 1 choledochal cyst. Smooth tapering of the distal common duct, measuring  8 mm (series 4/ image 23). No evidence of choledocholithiasis.  Tiny left lower pole renal cyst (series 6/ image 40). Kidneys are otherwise within normal limits. No hydronephrosis.  No suspicious abdominal lymphadenopathy.  No focal osseous lesions.  IMPRESSION: Motion degraded images.  Acute pancreatitis, without evidence of complication.  Gallbladder sludge, without associated inflammatory changes.  Dilated common duct with possible type 1 choledochal cyst. No evidence of choledocholithiasis.   Electronically Signed   By: Julian Hy M.D.   On: 05/01/2014 17:19    Microbiology: Recent Results (from the past 240 hour(s))  CULTURE, BLOOD (ROUTINE X 2)     Status: None   Collection Time    04/29/14 11:25 PM      Result Value Ref Range Status   Specimen Description BLOOD RIGHT ANTECUBITAL   Final   Special Requests     Final   Value: BOTTLES DRAWN AEROBIC AND ANAEROBIC AER 10CC ANA 5CC   Culture  Setup Time     Final   Value: 04/30/2014 03:39     Performed at [facility]   Culture     Final   Value:        BLOOD CULTURE RECEIVED NO GROWTH TO DATE CULTURE WILL BE HELD FOR 5 DAYS BEFORE ISSUING A FINAL NEGATIVE REPORT      Performed at [facility]   Report Status PENDING   Incomplete  CULTURE, BLOOD (ROUTINE X 2)     Status: None   Collection Time    04/29/14 11:38 PM      Result Value Ref Range Status   Specimen Description BLOOD RIGHT HAND   Final   Special Requests     Final   Value: BOTTLES DRAWN AEROBIC AND ANAEROBIC AER 10CC ANA [place]   Culture  Setup Time     Final   Value: 04/30/2014 03:39     Performed at [facility]   Culture     Final   Value:        BLOOD CULTURE RECEIVED NO GROWTH TO DATE CULTURE WILL BE HELD FOR 5 DAYS BEFORE ISSUING A FINAL NEGATIVE REPORT     Performed at [facility]   Report Status PENDING   Incomplete  SURGICAL PCR SCREEN     Status: None   Collection Time    05/02/14  1:10 PM      Result Value Ref Range Status   MRSA, PCR NEGATIVE  NEGATIVE Final   Staphylococcus aureus NEGATIVE  NEGATIVE Final   Comment:            The Xpert SA Assay (FDA     approved for NASAL specimens     in patients over 55 years of age),     is one component of     a comprehensive surveillance     program.  Test performance has     been validated by [employer] for patients greater     than or equal to 16 year old.     It is not intended     to diagnose infection nor to     guide or monitor treatment.     Labs: Basic Metabolic Panel:  Recent Labs Lab 05/01/14 0600 05/02/14 0830 05/03/14 0607 05/04/14 0542 05/05/14 0608  NA 137 138 141 143 141  K 4.0 3.5* 3.6* 4.6 4.0  CL 105 105 106 105 105  CO2 24 21 23 23 25   GLUCOSE 97  112* 107* 131* 108*  BUN 14 10 10 16 18   CREATININE 0.83 0.66 0.80 0.82 0.74  CALCIUM 8.9 8.9 9.2 9.0 8.7  MG  --  1.7 1.9 1.9 2.1   Liver Function Tests:  Recent Labs Lab 05/01/14 0600 05/02/14 0830 05/03/14 0607 05/04/14 0542 05/05/14 0608  AST 40* 28 46* 66* 56*  ALT 60* 42 39 46 50  ALKPHOS 127* 112 118* 107 91  BILITOT 1.3* 0.9 0.9 0.6 0.7  PROT 5.8* 6.0 6.2 5.9* 5.6*  ALBUMIN 2.7* 2.5* 2.5* 2.3* 2.2*     Recent Labs Lab 04/29/14 2325 05/01/14 0600 05/03/14 0607  LIPASE 1406* 71* 25   No results found for this basename: AMMONIA,  in the last 168 hours CBC:  Recent Labs Lab 05/01/14 0600 05/02/14 0445 05/03/14 0607 05/04/14 0542 05/05/14 0608  WBC 9.6 8.1 7.6 7.7 7.0  HGB 13.0 12.0* 12.4* 12.5* 11.9*  HCT 39.2 35.6* 36.5* 36.5* 35.5*  MCV 88.3 85.6 84.5 86.9 86.4  PLT 175 197 216 228 280   Cardiac Enzymes: No results found for this basename: CKTOTAL, CKMB, CKMBINDEX, TROPONINI,  in the last 168 hours BNP: BNP (last 3 results)  Recent Labs  09/10/13 1540 09/25/13 1107 03/13/14 1753  PROBNP 170.9* 34.0 145.5*   CBG: No results found for this basename: GLUCAP,  in the last 168 hours     Signed:  Larissa Pegg  [facility] 05/05/2014, 5:46 PM

## 2014-05-05 NOTE — Progress Notes (Signed)
Pt seen around 6  Drank 2 ensures this pm +flatus Alert, nad Soft, nt, protuberant abd (baseline), incisions cdi  Expect appetite to be off for several days and not to have a BM this soon after surgery Explained he may need mild laxative (miralax) or MOM rec trying to eat several small meals &/or drinking meal replacement shake if didn't feel like eating much All discussed with pt, wife and daughter Believe pt is fine for discharge Will hold off on f/u MRCP until I see him in clinic.  Discussed with [facility]  Leighton Ruff. Redmond Pulling, MD, FACS General, Bariatric, & Minimally Invasive Surgery [facility], [place]

## 2014-05-06 ENCOUNTER — Encounter (HOSPITAL_COMMUNITY): Payer: Self-pay | Admitting: General Surgery

## 2014-05-06 LAB — CULTURE, BLOOD (ROUTINE X 2)
CULTURE: NO GROWTH
Culture: NO GROWTH

## 2014-05-13 NOTE — Telephone Encounter (Signed)
Pt's MRCP cancelled per Dr. Redmond Pulling.

## 2014-05-16 ENCOUNTER — Emergency Department (HOSPITAL_COMMUNITY)
Admission: EM | Admit: 2014-05-16 | Discharge: 2014-05-16 | Disposition: A | Discharge status: Home / Self Care | Payer: Non-veteran care | Attending: Emergency Medicine | Admitting: Emergency Medicine

## 2014-05-16 ENCOUNTER — Encounter (HOSPITAL_COMMUNITY): Payer: Self-pay | Admitting: Emergency Medicine

## 2014-05-16 ENCOUNTER — Emergency Department (HOSPITAL_COMMUNITY): Payer: Non-veteran care

## 2014-05-16 DIAGNOSIS — G4733 Obstructive sleep apnea (adult) (pediatric): Secondary | ICD-10-CM | POA: Insufficient documentation

## 2014-05-16 DIAGNOSIS — Z9981 Dependence on supplemental oxygen: Secondary | ICD-10-CM | POA: Insufficient documentation

## 2014-05-16 DIAGNOSIS — M2540 Effusion, unspecified joint: Secondary | ICD-10-CM | POA: Insufficient documentation

## 2014-05-16 DIAGNOSIS — Z9889 Other specified postprocedural states: Secondary | ICD-10-CM | POA: Insufficient documentation

## 2014-05-16 DIAGNOSIS — I10 Essential (primary) hypertension: Secondary | ICD-10-CM | POA: Insufficient documentation

## 2014-05-16 DIAGNOSIS — Z95810 Presence of automatic (implantable) cardiac defibrillator: Secondary | ICD-10-CM | POA: Insufficient documentation

## 2014-05-16 DIAGNOSIS — Z79899 Other long term (current) drug therapy: Secondary | ICD-10-CM | POA: Insufficient documentation

## 2014-05-16 DIAGNOSIS — E785 Hyperlipidemia, unspecified: Secondary | ICD-10-CM | POA: Insufficient documentation

## 2014-05-16 DIAGNOSIS — R52 Pain, unspecified: Secondary | ICD-10-CM | POA: Insufficient documentation

## 2014-05-16 DIAGNOSIS — IMO0002 Reserved for concepts with insufficient information to code with codable children: Secondary | ICD-10-CM | POA: Insufficient documentation

## 2014-05-16 DIAGNOSIS — K219 Gastro-esophageal reflux disease without esophagitis: Secondary | ICD-10-CM | POA: Insufficient documentation

## 2014-05-16 DIAGNOSIS — Z85819 Personal history of malignant neoplasm of unspecified site of lip, oral cavity, and pharynx: Secondary | ICD-10-CM | POA: Insufficient documentation

## 2014-05-16 DIAGNOSIS — M129 Arthropathy, unspecified: Secondary | ICD-10-CM | POA: Insufficient documentation

## 2014-05-16 DIAGNOSIS — Z7982 Long term (current) use of aspirin: Secondary | ICD-10-CM | POA: Insufficient documentation

## 2014-05-16 DIAGNOSIS — G8929 Other chronic pain: Secondary | ICD-10-CM | POA: Insufficient documentation

## 2014-05-16 DIAGNOSIS — R109 Unspecified abdominal pain: Secondary | ICD-10-CM | POA: Insufficient documentation

## 2014-05-16 DIAGNOSIS — I2510 Atherosclerotic heart disease of native coronary artery without angina pectoris: Secondary | ICD-10-CM | POA: Insufficient documentation

## 2014-05-16 DIAGNOSIS — Z7901 Long term (current) use of anticoagulants: Secondary | ICD-10-CM | POA: Insufficient documentation

## 2014-05-16 DIAGNOSIS — I252 Old myocardial infarction: Secondary | ICD-10-CM | POA: Insufficient documentation

## 2014-05-16 DIAGNOSIS — Z87891 Personal history of nicotine dependence: Secondary | ICD-10-CM | POA: Insufficient documentation

## 2014-05-16 LAB — URINALYSIS, ROUTINE W REFLEX MICROSCOPIC
Glucose, UA: NEGATIVE mg/dL
HGB URINE DIPSTICK: NEGATIVE
KETONES UR: NEGATIVE mg/dL
Leukocytes, UA: NEGATIVE
Nitrite: NEGATIVE
PROTEIN: NEGATIVE mg/dL
Specific Gravity, Urine: 1.007 (ref 1.005–1.030)
Urobilinogen, UA: 0.2 mg/dL (ref 0.0–1.0)
pH: 6 (ref 5.0–8.0)

## 2014-05-16 LAB — COMPREHENSIVE METABOLIC PANEL
ALK PHOS: 123 U/L — AB (ref 39–117)
ALT: 22 U/L (ref 0–53)
ANION GAP: 14 (ref 5–15)
AST: 22 U/L (ref 0–37)
Albumin: 3.3 g/dL — ABNORMAL LOW (ref 3.5–5.2)
BILIRUBIN TOTAL: 0.8 mg/dL (ref 0.3–1.2)
BUN: 9 mg/dL (ref 6–23)
CHLORIDE: 99 meq/L (ref 96–112)
CO2: 25 mEq/L (ref 19–32)
Calcium: 9.8 mg/dL (ref 8.4–10.5)
Creatinine, Ser: 0.71 mg/dL (ref 0.50–1.35)
GLUCOSE: 111 mg/dL — AB (ref 70–99)
POTASSIUM: 4.1 meq/L (ref 3.7–5.3)
Sodium: 138 mEq/L (ref 137–147)
TOTAL PROTEIN: 7.1 g/dL (ref 6.0–8.3)

## 2014-05-16 LAB — CBC WITH DIFFERENTIAL/PLATELET
Basophils Absolute: 0 10*3/uL (ref 0.0–0.1)
Basophils Relative: 0 % (ref 0–1)
EOS ABS: 0.1 10*3/uL (ref 0.0–0.7)
Eosinophils Relative: 1 % (ref 0–5)
HCT: 40.6 % (ref 39.0–52.0)
HEMOGLOBIN: 13.4 g/dL (ref 13.0–17.0)
Lymphocytes Relative: 21 % (ref 12–46)
Lymphs Abs: 1.8 10*3/uL (ref 0.7–4.0)
MCH: 28.8 pg (ref 26.0–34.0)
MCHC: 33 g/dL (ref 30.0–36.0)
MCV: 87.3 fL (ref 78.0–100.0)
Monocytes Absolute: 0.5 10*3/uL (ref 0.1–1.0)
Monocytes Relative: 6 % (ref 3–12)
NEUTROS PCT: 72 % (ref 43–77)
Neutro Abs: 6.4 10*3/uL (ref 1.7–7.7)
Platelets: 427 10*3/uL — ABNORMAL HIGH (ref 150–400)
RBC: 4.65 MIL/uL (ref 4.22–5.81)
RDW: 13.8 % (ref 11.5–15.5)
WBC: 8.9 10*3/uL (ref 4.0–10.5)

## 2014-05-16 LAB — LIPASE, BLOOD: Lipase: 35 U/L (ref 11–59)

## 2014-05-16 LAB — SEDIMENTATION RATE: Sed Rate: 35 mm/hr — ABNORMAL HIGH (ref 0–16)

## 2014-05-16 MED ORDER — SODIUM CHLORIDE 0.9 % IV SOLN
INTRAVENOUS | Status: DC
  Administered 2014-05-16: 12:00:00 via INTRAVENOUS

## 2014-05-16 MED ORDER — OXYCODONE-ACETAMINOPHEN 5-325 MG PO TABS
2.0000 | ORAL_TABLET | Freq: Once | ORAL | Status: AC
  Administered 2014-05-16: 2 via ORAL
  Filled 2014-05-16: qty 2

## 2014-05-16 MED ORDER — OXYCODONE-ACETAMINOPHEN 5-325 MG PO TABS: 1.0000 | ORAL_TABLET | ORAL | Status: DC | PRN
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
## 2014-05-16 NOTE — ED Notes (Signed)
Patient transported to X-ray 

## 2014-05-16 NOTE — ED Notes (Signed)
Patient returned from X-ray 

## 2014-05-16 NOTE — Discharge Instructions (Signed)
Followup with your Dr. this week and return here for any problems

## 2014-05-16 NOTE — ED Notes (Signed)
Pt reports went home from hospitalization x 1 week. Pt has had pain all over his body and swelling to left wrist since. Pt recently had his gallbladder removed.

## 2014-05-17 LAB — URINE CULTURE: Colony Count: 2000

## 2014-05-25 ENCOUNTER — Encounter: Payer: Non-veteran care | Admitting: Cardiovascular Disease

## 2014-05-25 NOTE — Progress Notes (Signed)
No show

## 2014-05-26 ENCOUNTER — Encounter (INDEPENDENT_AMBULATORY_CARE_PROVIDER_SITE_OTHER): Payer: Self-pay | Admitting: General Surgery

## 2014-05-26 ENCOUNTER — Ambulatory Visit (INDEPENDENT_AMBULATORY_CARE_PROVIDER_SITE_OTHER): Payer: Medicare Other | Admitting: General Surgery

## 2014-05-26 VITALS — BP 126/72 | HR 68 | Temp 97.4°F | Ht 67.0 in | Wt 189.0 lb

## 2014-05-26 DIAGNOSIS — Z09 Encounter for follow-up examination after completed treatment for conditions other than malignant neoplasm: Secondary | ICD-10-CM

## 2014-05-26 NOTE — Patient Instructions (Signed)
Your energy level will continue to improve Please get labs drawn - have them fax to me at [phone] Drink plenty of water We will hold off on repeating MRI/MRCP for now.

## 2014-05-28 NOTE — Progress Notes (Signed)
Subjective:     Patient ID: [name], male   DOB: Oct 11, 1940, 73 y.o.   MRN: [id number]  HPI 73 year old man comes in for followup after undergoing laparoscopic cholecystectomy with cholangiogramafter being in the hospital from July 30 through August 5 initially being admitted for gallstone pancreatitis. During his preoperative workup he was found to have a questionable choledocho cyst with MRCP. His interoperative angiogram this demonstrated dilated bile ducts. He denies any nausea or vomiting. He states that his energy level is slowly returning to normal. Reports daily bowel movements. He still little bit sore in his abdomen. His appetite is also slowly returning to normal. He denies any jaundice.  Review of Systems     Objective:   Physical Exam BP 126/72  Pulse 68  Temp(Src) 97.4 F (36.3 C)  Ht 5\' 7"  (1.702 m)  Wt 189 lb (85.73 kg)  BMI 29.59 kg/m2  Gen: alert, NAD, non-toxic appearing Pupils: equal, no scleral icterus Pulm: Lungs clear to auscultation, symmetric chest rise CV: regular rate and rhythm Abd: soft, nontender, nondistended. Well-healed trocar sites. No cellulitis. No incisional hernia Ext: no edema, no calf tenderness Skin: no rash, no jaundice     Assessment:     Status post laparoscopic cholecystectomy with cholangiogram Questionable type I choledochal cyst on preoperative MRCP     Plan:     Overall he is feeling as expected. I advised him that his energy level and appetite will continue to improve. We did need to repeat his liver function tests since they were still slightly elevated while in the hospital. His intraoperative cholangiogram did not reveal any formal true cyst. It just showed dilated bile ducts. I will wait to see what his LFTs are doing before I decide whether or not we need to repeat any imaging however I believe that the cholangiogram puts it to rest that he does not have a  choledochal cyst.his pathology report shows benign gallbladder  with chronic inflammation and no cholelithiasis. We will repeat his LFTs and I will see him in 6 weeks.  Note: This dictation was prepared with Dragon/digital dictation along with [employer]. Any transcriptional errors that result from this process are unintentional.  Leighton Ruff. Redmond Pulling, MD, FACS General, Bariatric, & Minimally Invasive Surgery [facility], [place]

## 2014-06-02 ENCOUNTER — Encounter (INDEPENDENT_AMBULATORY_CARE_PROVIDER_SITE_OTHER): Payer: Medicare Other | Admitting: General Surgery

## 2014-06-09 ENCOUNTER — Other Ambulatory Visit: Payer: Non-veteran care

## 2014-07-14 ENCOUNTER — Encounter (INDEPENDENT_AMBULATORY_CARE_PROVIDER_SITE_OTHER): Payer: Medicare Other | Admitting: General Surgery

## 2014-08-09 ENCOUNTER — Ambulatory Visit: Payer: Non-veteran care | Admitting: Cardiovascular Disease

## 2014-08-11 ENCOUNTER — Ambulatory Visit: Payer: Non-veteran care | Admitting: Cardiovascular Disease

## 2014-09-09 ENCOUNTER — Encounter (HOSPITAL_COMMUNITY): Payer: Self-pay | Admitting: Cardiology

## 2014-10-12 ENCOUNTER — Encounter: Payer: Non-veteran care | Admitting: Cardiovascular Disease

## 2014-10-12 NOTE — Progress Notes (Signed)
No show

## 2015-02-18 ENCOUNTER — Encounter: Payer: Self-pay | Admitting: Gastroenterology

## 2015-03-19 ENCOUNTER — Emergency Department (HOSPITAL_COMMUNITY)
Admission: EM | Admit: 2015-03-19 | Discharge: 2015-03-19 | Disposition: A | Discharge status: Home / Self Care | Payer: Medicare Other | Attending: Emergency Medicine | Admitting: Emergency Medicine

## 2015-03-19 ENCOUNTER — Encounter (HOSPITAL_COMMUNITY): Payer: Self-pay | Admitting: Emergency Medicine

## 2015-03-19 DIAGNOSIS — Z7902 Long term (current) use of antithrombotics/antiplatelets: Secondary | ICD-10-CM | POA: Diagnosis not present

## 2015-03-19 DIAGNOSIS — I10 Essential (primary) hypertension: Secondary | ICD-10-CM | POA: Insufficient documentation

## 2015-03-19 DIAGNOSIS — M1990 Unspecified osteoarthritis, unspecified site: Secondary | ICD-10-CM | POA: Diagnosis not present

## 2015-03-19 DIAGNOSIS — Z9049 Acquired absence of other specified parts of digestive tract: Secondary | ICD-10-CM | POA: Insufficient documentation

## 2015-03-19 DIAGNOSIS — I252 Old myocardial infarction: Secondary | ICD-10-CM | POA: Insufficient documentation

## 2015-03-19 DIAGNOSIS — R197 Diarrhea, unspecified: Secondary | ICD-10-CM | POA: Diagnosis not present

## 2015-03-19 DIAGNOSIS — R1030 Lower abdominal pain, unspecified: Secondary | ICD-10-CM | POA: Diagnosis present

## 2015-03-19 DIAGNOSIS — G8929 Other chronic pain: Secondary | ICD-10-CM | POA: Insufficient documentation

## 2015-03-19 DIAGNOSIS — Z9889 Other specified postprocedural states: Secondary | ICD-10-CM | POA: Diagnosis not present

## 2015-03-19 DIAGNOSIS — Z7982 Long term (current) use of aspirin: Secondary | ICD-10-CM | POA: Diagnosis not present

## 2015-03-19 DIAGNOSIS — K219 Gastro-esophageal reflux disease without esophagitis: Secondary | ICD-10-CM | POA: Insufficient documentation

## 2015-03-19 DIAGNOSIS — I2510 Atherosclerotic heart disease of native coronary artery without angina pectoris: Secondary | ICD-10-CM | POA: Insufficient documentation

## 2015-03-19 DIAGNOSIS — Z8521 Personal history of malignant neoplasm of larynx: Secondary | ICD-10-CM | POA: Diagnosis not present

## 2015-03-19 DIAGNOSIS — Z87891 Personal history of nicotine dependence: Secondary | ICD-10-CM | POA: Insufficient documentation

## 2015-03-19 DIAGNOSIS — Z79899 Other long term (current) drug therapy: Secondary | ICD-10-CM | POA: Insufficient documentation

## 2015-03-19 DIAGNOSIS — E785 Hyperlipidemia, unspecified: Secondary | ICD-10-CM | POA: Diagnosis not present

## 2015-03-19 LAB — URINALYSIS, ROUTINE W REFLEX MICROSCOPIC
GLUCOSE, UA: NEGATIVE mg/dL
KETONES UR: NEGATIVE mg/dL
LEUKOCYTES UA: NEGATIVE
Nitrite: NEGATIVE
Specific Gravity, Urine: >1.03 — ABNORMAL HIGH (ref 1.005–1.030)
Urobilinogen, UA: 0.2 mg/dL (ref 0.0–1.0)
pH: 5 (ref 5.0–8.0)

## 2015-03-19 LAB — CBC WITH DIFFERENTIAL/PLATELET
Basophils Absolute: 0 10*3/uL (ref 0.0–0.1)
Basophils Relative: 0 % (ref 0–1)
EOS ABS: 0 10*3/uL (ref 0.0–0.7)
Eosinophils Relative: 0 % (ref 0–5)
HCT: 46 % (ref 39.0–52.0)
Hemoglobin: 15.9 g/dL (ref 13.0–17.0)
Lymphocytes Relative: 6 % — ABNORMAL LOW (ref 12–46)
Lymphs Abs: 0.6 10*3/uL — ABNORMAL LOW (ref 0.7–4.0)
MCH: 29.6 pg (ref 26.0–34.0)
MCHC: 34.6 g/dL (ref 30.0–36.0)
MCV: 85.5 fL (ref 78.0–100.0)
MONO ABS: 0.3 10*3/uL (ref 0.1–1.0)
MONOS PCT: 3 % (ref 3–12)
Neutro Abs: 10.2 10*3/uL — ABNORMAL HIGH (ref 1.7–7.7)
Neutrophils Relative %: 91 % — ABNORMAL HIGH (ref 43–77)
PLATELETS: 255 10*3/uL (ref 150–400)
RBC: 5.38 MIL/uL (ref 4.22–5.81)
RDW: 13.8 % (ref 11.5–15.5)
WBC: 11.1 10*3/uL — AB (ref 4.0–10.5)

## 2015-03-19 LAB — COMPREHENSIVE METABOLIC PANEL
ALK PHOS: 88 U/L (ref 38–126)
ALT: 14 U/L — ABNORMAL LOW (ref 17–63)
ANION GAP: 11 (ref 5–15)
AST: 22 U/L (ref 15–41)
Albumin: 4.2 g/dL (ref 3.5–5.0)
BILIRUBIN TOTAL: 1.3 mg/dL — AB (ref 0.3–1.2)
BUN: 14 mg/dL (ref 6–20)
CHLORIDE: 103 mmol/L (ref 101–111)
CO2: 24 mmol/L (ref 22–32)
Calcium: 9.5 mg/dL (ref 8.9–10.3)
Creatinine, Ser: 0.97 mg/dL (ref 0.61–1.24)
GFR calc Af Amer: >60 mL/min (ref >60)
GFR calc non Af Amer: >60 mL/min (ref >60)
Glucose, Bld: 128 mg/dL — ABNORMAL HIGH (ref 65–99)
Potassium: 4 mmol/L (ref 3.5–5.1)
Sodium: 138 mmol/L (ref 135–145)
Total Protein: 7.5 g/dL (ref 6.5–8.1)

## 2015-03-19 LAB — URINE MICROSCOPIC-ADD ON

## 2015-03-19 NOTE — ED Notes (Signed)
Unable to obtain stool sample yet. Dr. Eulis Foster informed.
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
## 2015-03-19 NOTE — Discharge Instructions (Signed)

## 2015-03-19 NOTE — ED Notes (Signed)
Patient c/o generalized abd pain with nausea, vomiting, and diarrhea. Patient also reports chills but is unsure of any fevers. Patient reports taking imodium and drinking ginger ale with no relief.

## 2015-03-19 NOTE — ED Notes (Signed)
Discharge instructions given, pt demonstrated teach back and verbal understanding. No concerns voiced.  

## 2015-07-17 ENCOUNTER — Encounter (HOSPITAL_COMMUNITY): Payer: Self-pay | Admitting: *Deleted

## 2015-07-17 ENCOUNTER — Emergency Department (HOSPITAL_COMMUNITY)
Admission: EM | Admit: 2015-07-17 | Discharge: 2015-07-17 | Disposition: A | Discharge status: Home / Self Care | Payer: Medicare Other | Attending: Emergency Medicine | Admitting: Emergency Medicine

## 2015-07-17 ENCOUNTER — Ambulatory Visit (HOSPITAL_COMMUNITY)
Admit: 2015-07-17 | Discharge: 2015-07-17 | Disposition: A | Discharge status: Home / Self Care | Payer: Medicare Other | Attending: Emergency Medicine | Admitting: Emergency Medicine

## 2015-07-17 DIAGNOSIS — Z8521 Personal history of malignant neoplasm of larynx: Secondary | ICD-10-CM | POA: Insufficient documentation

## 2015-07-17 DIAGNOSIS — M25571 Pain in right ankle and joints of right foot: Secondary | ICD-10-CM | POA: Insufficient documentation

## 2015-07-17 DIAGNOSIS — G8929 Other chronic pain: Secondary | ICD-10-CM | POA: Insufficient documentation

## 2015-07-17 DIAGNOSIS — G4733 Obstructive sleep apnea (adult) (pediatric): Secondary | ICD-10-CM | POA: Insufficient documentation

## 2015-07-17 DIAGNOSIS — M7989 Other specified soft tissue disorders: Secondary | ICD-10-CM | POA: Diagnosis present

## 2015-07-17 DIAGNOSIS — I2510 Atherosclerotic heart disease of native coronary artery without angina pectoris: Secondary | ICD-10-CM | POA: Insufficient documentation

## 2015-07-17 DIAGNOSIS — R600 Localized edema: Secondary | ICD-10-CM

## 2015-07-17 DIAGNOSIS — K219 Gastro-esophageal reflux disease without esophagitis: Secondary | ICD-10-CM | POA: Diagnosis not present

## 2015-07-17 DIAGNOSIS — M1990 Unspecified osteoarthritis, unspecified site: Secondary | ICD-10-CM | POA: Diagnosis not present

## 2015-07-17 DIAGNOSIS — I10 Essential (primary) hypertension: Secondary | ICD-10-CM | POA: Insufficient documentation

## 2015-07-17 DIAGNOSIS — Z7951 Long term (current) use of inhaled steroids: Secondary | ICD-10-CM | POA: Diagnosis not present

## 2015-07-17 DIAGNOSIS — Z8619 Personal history of other infectious and parasitic diseases: Secondary | ICD-10-CM | POA: Insufficient documentation

## 2015-07-17 DIAGNOSIS — Z79899 Other long term (current) drug therapy: Secondary | ICD-10-CM | POA: Diagnosis not present

## 2015-07-17 DIAGNOSIS — Z7902 Long term (current) use of antithrombotics/antiplatelets: Secondary | ICD-10-CM | POA: Insufficient documentation

## 2015-07-17 DIAGNOSIS — I252 Old myocardial infarction: Secondary | ICD-10-CM | POA: Insufficient documentation

## 2015-07-17 DIAGNOSIS — Z9889 Other specified postprocedural states: Secondary | ICD-10-CM | POA: Insufficient documentation

## 2015-07-17 DIAGNOSIS — E785 Hyperlipidemia, unspecified: Secondary | ICD-10-CM | POA: Insufficient documentation

## 2015-07-17 DIAGNOSIS — M25561 Pain in right knee: Secondary | ICD-10-CM | POA: Diagnosis not present

## 2015-07-17 DIAGNOSIS — Z9861 Coronary angioplasty status: Secondary | ICD-10-CM | POA: Insufficient documentation

## 2015-07-17 DIAGNOSIS — R2241 Localized swelling, mass and lump, right lower limb: Secondary | ICD-10-CM | POA: Diagnosis not present

## 2015-07-17 DIAGNOSIS — M25551 Pain in right hip: Secondary | ICD-10-CM | POA: Insufficient documentation

## 2015-07-17 DIAGNOSIS — Z7982 Long term (current) use of aspirin: Secondary | ICD-10-CM | POA: Insufficient documentation

## 2015-07-17 DIAGNOSIS — Z87891 Personal history of nicotine dependence: Secondary | ICD-10-CM | POA: Insufficient documentation

## 2015-07-17 HISTORY — DX: Herpesviral infection, unspecified: B00.9

## 2015-07-17 LAB — I-STAT CHEM 8, ED
BUN: 9 mg/dL (ref 6–20)
CHLORIDE: 101 mmol/L (ref 101–111)
Calcium, Ion: 1.17 mmol/L (ref 1.13–1.30)
Creatinine, Ser: 0.9 mg/dL (ref 0.61–1.24)
GLUCOSE: 117 mg/dL — AB (ref 65–99)
HCT: 41 % (ref 39.0–52.0)
Hemoglobin: 13.9 g/dL (ref 13.0–17.0)
Potassium: 3 mmol/L — ABNORMAL LOW (ref 3.5–5.1)
Sodium: 140 mmol/L (ref 135–145)
TCO2: 24 mmol/L (ref 0–100)

## 2015-07-17 MED ORDER — OXYCODONE-ACETAMINOPHEN 5-325 MG PO TABS
ORAL_TABLET | ORAL | Status: AC
  Filled 2015-07-17: qty 1

## 2015-07-17 MED ORDER — OXYCODONE-ACETAMINOPHEN 5-325 MG PO TABS
1.0000 | ORAL_TABLET | ORAL | Status: DC | PRN
Start: 2015-07-17 — End: 2017-05-17

## 2015-07-17 MED ORDER — OXYCODONE-ACETAMINOPHEN 5-325 MG PO TABS
1.0000 | ORAL_TABLET | Freq: Once | ORAL | Status: AC
  Administered 2015-07-17: 1 via ORAL

## 2015-07-17 MED ORDER — ENOXAPARIN SODIUM 100 MG/ML ~~LOC~~ SOLN
1.0000 mg/kg | Freq: Once | SUBCUTANEOUS | Status: AC
  Administered 2015-07-17: 95 mg via SUBCUTANEOUS
  Filled 2015-07-17: qty 1
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
## 2015-07-17 NOTE — ED Notes (Addendum)
Pt c/o right ankle pain that radiates to his right knee and right hip. Right ankle is swollen.

## 2015-07-17 NOTE — Discharge Instructions (Signed)
PLEASE RETURN AT 10AM FOR THE ULTRASOUND OF YOUR LEG  Edema Edema is an abnormal buildup of fluids in your bodytissues. Edema is somewhatdependent on gravity to pull the fluid to the lowest place in your body. That makes the condition more common in the legs and thighs (lower extremities). Painless swelling of the feet and ankles is common and becomes more likely as you get older. It is also common in looser tissues, like around your eyes.  When the affected area is squeezed, the fluid may move out of that spot and leave a dent for a few moments. This dent is called pitting.  CAUSES  There are many possible causes of edema. Eating too much salt and being on your feet or sitting for a long time can cause edema in your legs and ankles. Hot weather may make edema worse. Common medical causes of edema include:  Heart failure.  Liver disease.  Kidney disease.  Weak blood vessels in your legs.  Cancer.  An injury.  Pregnancy.  Some medications.  Obesity. SYMPTOMS  Edema is usually painless.Your skin may look swollen or shiny.  DIAGNOSIS  Your health care provider may be able to diagnose edema by asking about your medical history and doing a physical exam. You may need to have tests such as X-rays, an electrocardiogram, or blood tests to check for medical conditions that may cause edema.  TREATMENT  Edema treatment depends on the cause. If you have heart, liver, or kidney disease, you need the treatment appropriate for these conditions. General treatment may include:  Elevation of the affected body part above the level of your heart.  Compression of the affected body part. Pressure from elastic bandages or support stockings squeezes the tissues and forces fluid back into the blood vessels. This keeps fluid from entering the tissues.  Restriction of fluid and salt intake.  Use of a water pill (diuretic). These medications are appropriate only for some types of edema. They pull fluid  out of your body and make you urinate more often. This gets rid of fluid and reduces swelling, but diuretics can have side effects. Only use diuretics as directed by your health care provider. HOME CARE INSTRUCTIONS   Keep the affected body part above the level of your heart when you are lying down.   Do not sit still or stand for prolonged periods.   Do not put anything directly under your knees when lying down.  Do not wear constricting clothing or garters on your upper legs.   Exercise your legs to work the fluid back into your blood vessels. This may help the swelling go down.   Wear elastic bandages or support stockings to reduce ankle swelling as directed by your health care provider.   Eat a low-salt diet to reduce fluid if your health care provider recommends it.   Only take medicines as directed by your health care provider. SEEK MEDICAL CARE IF:   Your edema is not responding to treatment.  You have heart, liver, or kidney disease and notice symptoms of edema.  You have edema in your legs that does not improve after elevating them.   You have sudden and unexplained weight gain. SEEK IMMEDIATE MEDICAL CARE IF:   You develop shortness of breath or chest pain.   You cannot breathe when you lie down.  You develop pain, redness, or warmth in the swollen areas.   You have heart, liver, or kidney disease and suddenly get edema.  You have  a fever and your symptoms suddenly get worse. MAKE SURE YOU:   Understand these instructions.  Will watch your condition.  Will get help right away if you are not doing well or get worse.   This information is not intended to replace advice given to you by your health care provider. Make sure you discuss any questions you have with your health care provider.   Document Released: 09/17/2005 Document Revised: 10/08/2014 Document Reviewed: 07/10/2013 Elsevier Interactive Patient Education [employer].

## 2015-10-07 ENCOUNTER — Ambulatory Visit: Payer: Medicare Other | Admitting: Nurse Practitioner

## 2016-04-19 ENCOUNTER — Observation Stay (HOSPITAL_COMMUNITY)
Admission: EM | Admit: 2016-04-19 | Discharge: 2016-04-22 | Disposition: A | Discharge status: Home / Self Care | Payer: Medicare Other | Attending: Internal Medicine | Admitting: Internal Medicine

## 2016-04-19 ENCOUNTER — Emergency Department (HOSPITAL_COMMUNITY): Payer: Medicare Other

## 2016-04-19 ENCOUNTER — Encounter (HOSPITAL_COMMUNITY): Payer: Self-pay | Admitting: Emergency Medicine

## 2016-04-19 DIAGNOSIS — G4733 Obstructive sleep apnea (adult) (pediatric): Secondary | ICD-10-CM | POA: Insufficient documentation

## 2016-04-19 DIAGNOSIS — K219 Gastro-esophageal reflux disease without esophagitis: Secondary | ICD-10-CM | POA: Insufficient documentation

## 2016-04-19 DIAGNOSIS — M8588 Other specified disorders of bone density and structure, other site: Secondary | ICD-10-CM | POA: Diagnosis not present

## 2016-04-19 DIAGNOSIS — G4700 Insomnia, unspecified: Secondary | ICD-10-CM | POA: Diagnosis not present

## 2016-04-19 DIAGNOSIS — Z87891 Personal history of nicotine dependence: Secondary | ICD-10-CM | POA: Insufficient documentation

## 2016-04-19 DIAGNOSIS — R079 Chest pain, unspecified: Secondary | ICD-10-CM | POA: Diagnosis not present

## 2016-04-19 DIAGNOSIS — Z7982 Long term (current) use of aspirin: Secondary | ICD-10-CM | POA: Insufficient documentation

## 2016-04-19 DIAGNOSIS — E785 Hyperlipidemia, unspecified: Secondary | ICD-10-CM | POA: Insufficient documentation

## 2016-04-19 DIAGNOSIS — Z6831 Body mass index (BMI) 31.0-31.9, adult: Secondary | ICD-10-CM | POA: Insufficient documentation

## 2016-04-19 DIAGNOSIS — I2510 Atherosclerotic heart disease of native coronary artery without angina pectoris: Secondary | ICD-10-CM

## 2016-04-19 DIAGNOSIS — E669 Obesity, unspecified: Secondary | ICD-10-CM | POA: Insufficient documentation

## 2016-04-19 DIAGNOSIS — R0602 Shortness of breath: Secondary | ICD-10-CM | POA: Insufficient documentation

## 2016-04-19 DIAGNOSIS — M5134 Other intervertebral disc degeneration, thoracic region: Secondary | ICD-10-CM | POA: Diagnosis not present

## 2016-04-19 DIAGNOSIS — E876 Hypokalemia: Secondary | ICD-10-CM | POA: Diagnosis not present

## 2016-04-19 DIAGNOSIS — Z8521 Personal history of malignant neoplasm of larynx: Secondary | ICD-10-CM | POA: Diagnosis not present

## 2016-04-19 DIAGNOSIS — Z7902 Long term (current) use of antithrombotics/antiplatelets: Secondary | ICD-10-CM | POA: Diagnosis not present

## 2016-04-19 DIAGNOSIS — Z923 Personal history of irradiation: Secondary | ICD-10-CM | POA: Insufficient documentation

## 2016-04-19 DIAGNOSIS — I252 Old myocardial infarction: Secondary | ICD-10-CM | POA: Insufficient documentation

## 2016-04-19 DIAGNOSIS — I080 Rheumatic disorders of both mitral and aortic valves: Secondary | ICD-10-CM | POA: Insufficient documentation

## 2016-04-19 DIAGNOSIS — N400 Enlarged prostate without lower urinary tract symptoms: Secondary | ICD-10-CM | POA: Diagnosis not present

## 2016-04-19 DIAGNOSIS — Z955 Presence of coronary angioplasty implant and graft: Secondary | ICD-10-CM

## 2016-04-19 DIAGNOSIS — I10 Essential (primary) hypertension: Secondary | ICD-10-CM | POA: Diagnosis not present

## 2016-04-19 DIAGNOSIS — R600 Localized edema: Secondary | ICD-10-CM | POA: Diagnosis not present

## 2016-04-19 DIAGNOSIS — R51 Headache: Secondary | ICD-10-CM | POA: Insufficient documentation

## 2016-04-19 DIAGNOSIS — M1389 Other specified arthritis, multiple sites: Secondary | ICD-10-CM | POA: Diagnosis not present

## 2016-04-19 DIAGNOSIS — I25119 Atherosclerotic heart disease of native coronary artery with unspecified angina pectoris: Secondary | ICD-10-CM

## 2016-04-19 LAB — CBC
HCT: 44.4 % (ref 39.0–52.0)
Hemoglobin: 14.7 g/dL (ref 13.0–17.0)
MCH: 28.8 pg (ref 26.0–34.0)
MCHC: 33.1 g/dL (ref 30.0–36.0)
MCV: 86.9 fL (ref 78.0–100.0)
PLATELETS: 280 10*3/uL (ref 150–400)
RBC: 5.11 MIL/uL (ref 4.22–5.81)
RDW: 13.9 % (ref 11.5–15.5)
WBC: 6.1 10*3/uL (ref 4.0–10.5)

## 2016-04-19 LAB — I-STAT TROPONIN, ED: Troponin i, poc: 0.01 ng/mL (ref 0.00–0.08)

## 2016-04-19 LAB — BASIC METABOLIC PANEL
Anion gap: 7 (ref 5–15)
BUN: 10 mg/dL (ref 6–20)
CALCIUM: 9.7 mg/dL (ref 8.9–10.3)
CO2: 26 mmol/L (ref 22–32)
CREATININE: 1.14 mg/dL (ref 0.61–1.24)
Chloride: 106 mmol/L (ref 101–111)
GFR calc non Af Amer: >60 mL/min (ref >60)
Glucose, Bld: 104 mg/dL — ABNORMAL HIGH (ref 65–99)
Potassium: 4.1 mmol/L (ref 3.5–5.1)
SODIUM: 139 mmol/L (ref 135–145)

## 2016-04-19 LAB — D-DIMER, QUANTITATIVE: D-Dimer, Quant: 0.61 ug/mL-FEU — ABNORMAL HIGH (ref 0.00–0.50)

## 2016-04-19 MED ORDER — ACETAMINOPHEN 325 MG PO TABS
650.0000 mg | ORAL_TABLET | ORAL | Status: DC | PRN
  Administered 2016-04-20 – 2016-04-21 (×2): 650 mg via ORAL
  Filled 2016-04-19 (×2): qty 2

## 2016-04-19 MED ORDER — ENOXAPARIN SODIUM 100 MG/ML ~~LOC~~ SOLN
1.0000 mg/kg | Freq: Two times a day (BID) | SUBCUTANEOUS | Status: DC
  Administered 2016-04-20: 100 mg via SUBCUTANEOUS
  Filled 2016-04-19 (×2): qty 1

## 2016-04-19 MED ORDER — PANTOPRAZOLE SODIUM 40 MG PO TBEC
40.0000 mg | DELAYED_RELEASE_TABLET | Freq: Two times a day (BID) | ORAL | Status: DC
  Administered 2016-04-19 – 2016-04-22 (×6): 40 mg via ORAL
  Filled 2016-04-19 (×6): qty 1

## 2016-04-19 MED ORDER — FLUTICASONE PROPIONATE 50 MCG/ACT NA SUSP: 2.0000 | Freq: Every day | NASAL | Status: DC | PRN

## 2016-04-19 MED ORDER — ENOXAPARIN SODIUM 40 MG/0.4ML ~~LOC~~ SOLN: 40.0000 mg | SUBCUTANEOUS | Status: DC

## 2016-04-19 MED ORDER — NITROGLYCERIN 0.4 MG SL SUBL: 0.4000 mg | SUBLINGUAL_TABLET | SUBLINGUAL | Status: DC | PRN

## 2016-04-19 MED ORDER — ASPIRIN EC 325 MG PO TBEC
325.0000 mg | DELAYED_RELEASE_TABLET | Freq: Once | ORAL | Status: AC
  Administered 2016-04-19: 325 mg via ORAL
  Filled 2016-04-19: qty 1

## 2016-04-19 MED ORDER — ONDANSETRON HCL 4 MG/2ML IJ SOLN: 4.0000 mg | Freq: Four times a day (QID) | INTRAMUSCULAR | Status: DC | PRN

## 2016-04-19 MED ORDER — LORATADINE 10 MG PO TABS
10.0000 mg | ORAL_TABLET | Freq: Every day | ORAL | Status: DC
  Administered 2016-04-20 – 2016-04-22 (×3): 10 mg via ORAL
  Filled 2016-04-19 (×3): qty 1

## 2016-04-19 MED ORDER — HYDRALAZINE HCL 20 MG/ML IJ SOLN
10.0000 mg | Freq: Once | INTRAMUSCULAR | Status: AC
  Administered 2016-04-19: 10 mg via INTRAVENOUS
  Filled 2016-04-19: qty 1

## 2016-04-19 MED ORDER — TAMSULOSIN HCL 0.4 MG PO CAPS
0.4000 mg | ORAL_CAPSULE | Freq: Every day | ORAL | Status: DC
  Administered 2016-04-20 – 2016-04-21 (×2): 0.4 mg via ORAL
  Filled 2016-04-19 (×2): qty 1

## 2016-04-19 MED ORDER — GI COCKTAIL ~~LOC~~: 30.0000 mL | Freq: Four times a day (QID) | ORAL | Status: DC | PRN

## 2016-04-19 MED ORDER — MORPHINE SULFATE (PF) 2 MG/ML IV SOLN
2.0000 mg | INTRAVENOUS | Status: DC | PRN
  Filled 2016-04-19: qty 1

## 2016-04-19 MED ORDER — AMLODIPINE BESYLATE 5 MG PO TABS
5.0000 mg | ORAL_TABLET | Freq: Every day | ORAL | Status: DC
  Administered 2016-04-20 – 2016-04-21 (×2): 5 mg via ORAL
  Filled 2016-04-19 (×2): qty 1

## 2016-04-19 MED ORDER — HYDRALAZINE HCL 25 MG PO TABS
25.0000 mg | ORAL_TABLET | Freq: Two times a day (BID) | ORAL | Status: DC
  Administered 2016-04-19 – 2016-04-22 (×6): 25 mg via ORAL
  Filled 2016-04-19 (×6): qty 1

## 2016-04-19 MED ORDER — FUROSEMIDE 20 MG PO TABS
20.0000 mg | ORAL_TABLET | Freq: Every day | ORAL | Status: DC
  Administered 2016-04-20 – 2016-04-22 (×3): 20 mg via ORAL
  Filled 2016-04-19 (×3): qty 1

## 2016-04-19 MED ORDER — LORAZEPAM 1 MG PO TABS
1.0000 mg | ORAL_TABLET | Freq: Every evening | ORAL | Status: DC | PRN
  Administered 2016-04-20 (×2): 1 mg via ORAL
  Filled 2016-04-19 (×2): qty 1

## 2016-04-19 NOTE — ED Provider Notes (Signed)
Pt seen and evaluated.  D.W Dr. Alcide Evener.  EKG without change.  Pt pain free, and first Trop negative. Pt with h.o coronary disease.  Agree with admit for rule out/serial enymes.  Tanna Furry, MD 04/19/16 2134

## 2016-04-19 NOTE — Progress Notes (Signed)
ANTICOAGULATION CONSULT NOTE - Initial Consult  Pharmacy Consult for Lovenox Indication: r/o VTE  No Known Allergies  Patient Measurements:    Vital Signs: Temp: 98 F (36.7 C) (07/20 1547) Temp Source: Oral (07/20 1547) BP: 188/68 mmHg (07/20 2045) Pulse Rate: 48 (07/20 2045)  Labs:  Recent Labs  04/19/16 1558  HGB 14.7  HCT 44.4  PLT 280  CREATININE 1.14    CrCl cannot be calculated (Unknown ideal weight.).   Medical History: Past Medical History  Diagnosis Date  . Hypertension   . GERD (gastroesophageal reflux disease)   . Shortness of breath   . Hyperlipemia    . Bradycardia, drug induced 11/04/2011  . Aortic root dilatation, history of in 2008    . CAD (coronary artery disease), native coronary artery      PTCA distal CFX 2008 Dr. Einar Gip; non-obstructive by cath July 2014; s/p DES to the proximal LAD 09/10/2013  . Laryngeal cancer ([place]) 1998    S/P radiation therapy  . NSTEMI (non-ST elevated myocardial infarction) ([place]) 03/2013    Archie Endo 04/17/2013  . OSA on CPAP     "not wearing mask for the last month or so" (04/30/2014)  . Daily headache   . Arthritis     left knee; back" (04/30/2014)  . Chronic back pain   . Herpes     Medications:  Scheduled:  . [START ON 04/20/2016] amLODipine  5 mg Oral Daily  . [START ON 04/20/2016] furosemide  20 mg Oral Daily  . hydrALAZINE  25 mg Oral BID  . [START ON 04/20/2016] loratadine  10 mg Oral Daily  . pantoprazole  40 mg Oral BID  . [START ON 04/20/2016] tamsulosin  0.4 mg Oral QPC supper   Infusions:    Assessment: 75yo male presents with CP and lower extremity edema. Pharmacy is consulted to dose lovenox for suspected VTE.   Goal of Therapy:  Monitor platelets by anticoagulation protocol: Yes   Plan:  Lovenox 1mg /kg subcutaneously q12h Monitor renal function and s/sx of bleeding F/u long term anticoag  Andrey Cota. Diona Foley, PharmD, [place] Clinical Pharmacist Pager [phone] 04/19/2016,10:07 PM

## 2016-04-19 NOTE — ED Notes (Signed)
Pt sts mid sternal CP into neck; pt sts started yesterday

## 2016-04-19 NOTE — ED Notes (Signed)
EMT student at bedside placing IV. RN at bedside as well.
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
## 2016-04-19 NOTE — ED Notes (Signed)
Attempted report x 2 
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
## 2016-04-19 NOTE — ED Notes (Signed)
Attempted report x1. 

## 2016-04-20 ENCOUNTER — Observation Stay (HOSPITAL_BASED_OUTPATIENT_CLINIC_OR_DEPARTMENT_OTHER): Payer: Medicare Other

## 2016-04-20 ENCOUNTER — Other Ambulatory Visit: Payer: Self-pay

## 2016-04-20 ENCOUNTER — Encounter (HOSPITAL_COMMUNITY): Payer: Self-pay | Admitting: Physician Assistant

## 2016-04-20 DIAGNOSIS — I2510 Atherosclerotic heart disease of native coronary artery without angina pectoris: Secondary | ICD-10-CM | POA: Diagnosis not present

## 2016-04-20 DIAGNOSIS — N400 Enlarged prostate without lower urinary tract symptoms: Secondary | ICD-10-CM | POA: Diagnosis present

## 2016-04-20 DIAGNOSIS — R0789 Other chest pain: Secondary | ICD-10-CM | POA: Diagnosis not present

## 2016-04-20 DIAGNOSIS — R079 Chest pain, unspecified: Secondary | ICD-10-CM

## 2016-04-20 DIAGNOSIS — G4700 Insomnia, unspecified: Secondary | ICD-10-CM | POA: Diagnosis present

## 2016-04-20 DIAGNOSIS — R600 Localized edema: Secondary | ICD-10-CM | POA: Diagnosis not present

## 2016-04-20 DIAGNOSIS — I10 Essential (primary) hypertension: Secondary | ICD-10-CM | POA: Diagnosis not present

## 2016-04-20 LAB — BASIC METABOLIC PANEL
ANION GAP: 6 (ref 5–15)
BUN: 10 mg/dL (ref 6–20)
CALCIUM: 9.3 mg/dL (ref 8.9–10.3)
CO2: 26 mmol/L (ref 22–32)
CREATININE: 0.96 mg/dL (ref 0.61–1.24)
Chloride: 105 mmol/L (ref 101–111)
GFR calc Af Amer: >60 mL/min (ref >60)
GLUCOSE: 107 mg/dL — AB (ref 65–99)
Potassium: 3.4 mmol/L — ABNORMAL LOW (ref 3.5–5.1)
Sodium: 137 mmol/L (ref 135–145)

## 2016-04-20 LAB — CBC WITH DIFFERENTIAL/PLATELET
Basophils Absolute: 0 10*3/uL (ref 0.0–0.1)
Basophils Relative: 0 %
EOS PCT: 1 %
Eosinophils Absolute: 0 10*3/uL (ref 0.0–0.7)
HEMATOCRIT: 42.8 % (ref 39.0–52.0)
Hemoglobin: 14.1 g/dL (ref 13.0–17.0)
LYMPHS ABS: 2.1 10*3/uL (ref 0.7–4.0)
LYMPHS PCT: 39 %
MCH: 28.4 pg (ref 26.0–34.0)
MCHC: 32.9 g/dL (ref 30.0–36.0)
MCV: 86.1 fL (ref 78.0–100.0)
MONO ABS: 0.4 10*3/uL (ref 0.1–1.0)
MONOS PCT: 8 %
NEUTROS ABS: 2.8 10*3/uL (ref 1.7–7.7)
Neutrophils Relative %: 52 %
PLATELETS: 242 10*3/uL (ref 150–400)
RBC: 4.97 MIL/uL (ref 4.22–5.81)
RDW: 14 % (ref 11.5–15.5)
WBC: 5.4 10*3/uL (ref 4.0–10.5)

## 2016-04-20 LAB — LIPID PANEL
Cholesterol: 172 mg/dL (ref 0–200)
HDL: 32 mg/dL — ABNORMAL LOW (ref >40)
LDL CALC: 106 mg/dL — AB (ref 0–99)
TRIGLYCERIDES: 171 mg/dL — AB (ref <150)
Total CHOL/HDL Ratio: 5.4 RATIO
VLDL: 34 mg/dL (ref 0–40)

## 2016-04-20 LAB — BRAIN NATRIURETIC PEPTIDE: B Natriuretic Peptide: 66.8 pg/mL (ref 0.0–100.0)

## 2016-04-20 LAB — TROPONIN I
TROPONIN I: 0.04 ng/mL — AB (ref <0.03)
Troponin I: 0.03 ng/mL (ref <0.03)

## 2016-04-20 LAB — ECHOCARDIOGRAM COMPLETE
HEIGHTINCHES: 68 in
WEIGHTICAEL: 3350.4 [oz_av]

## 2016-04-20 MED ORDER — POTASSIUM CHLORIDE CRYS ER 20 MEQ PO TBCR
40.0000 meq | EXTENDED_RELEASE_TABLET | Freq: Once | ORAL | Status: AC
  Administered 2016-04-20: 40 meq via ORAL
  Filled 2016-04-20: qty 2

## 2016-04-20 MED ORDER — POTASSIUM CHLORIDE CRYS ER 20 MEQ PO TBCR
40.0000 meq | EXTENDED_RELEASE_TABLET | ORAL | Status: AC
  Administered 2016-04-20: 40 meq via ORAL
  Filled 2016-04-20: qty 2

## 2016-04-20 MED ORDER — ENOXAPARIN SODIUM 100 MG/ML ~~LOC~~ SOLN
1.0000 mg/kg | Freq: Two times a day (BID) | SUBCUTANEOUS | Status: DC
  Administered 2016-04-20 – 2016-04-22 (×3): 95 mg via SUBCUTANEOUS
  Filled 2016-04-20 (×4): qty 1

## 2016-04-20 MED ORDER — ASPIRIN EC 81 MG PO TBEC
81.0000 mg | DELAYED_RELEASE_TABLET | Freq: Every day | ORAL | Status: DC
  Administered 2016-04-20 – 2016-04-22 (×3): 81 mg via ORAL
  Filled 2016-04-20 (×3): qty 1
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
## 2016-04-20 NOTE — Progress Notes (Signed)
  Echocardiogram 2D Echocardiogram has been performed.  Johny Chess 04/20/2016, 11:09 AM

## 2016-04-20 NOTE — Progress Notes (Signed)
ANTICOAGULATION CONSULT NOTE - Follow up [facility] for Lovenox Indication: Chest pain and  r/o VTE  No Known Allergies  Patient Measurements: Height: 5\' 8"  (172.7 cm) Weight: 209 lb 6.4 oz (94.983 kg) IBW/kg (Calculated) : 68.4  Vital Signs: Temp: 97.8 F (36.6 C) (07/21 0500) Temp Source: Oral (07/21 0500) BP: 131/55 mmHg (07/21 0500) Pulse Rate: 59 (07/21 0500)  Labs:  Recent Labs  04/19/16 1558 04/20/16 0107 04/20/16 0352  HGB 14.7  --  14.1  HCT 44.4  --  42.8  PLT 280  --  242  CREATININE 1.14  --  0.96  TROPONINI  --  0.03* 0.04*    Estimated Creatinine Clearance: 73.1 mL/min (by C-G formula based on Cr of 0.96).   Medical History: Past Medical History  Diagnosis Date  . Hypertension   . GERD (gastroesophageal reflux disease)   . Shortness of breath   . Hyperlipemia    . Bradycardia, drug induced 11/04/2011  . Aortic root dilatation, history of in 2008    . CAD (coronary artery disease), native coronary artery      PTCA distal CFX 2008 Dr. Einar Gip; non-obstructive by cath July 2014; s/p DES to the proximal LAD 09/10/2013  . Laryngeal cancer ([place]) 1998    S/P radiation therapy  . NSTEMI (non-ST elevated myocardial infarction) ([place]) 03/2013    Archie Endo 04/17/2013  . OSA on CPAP     "not wearing mask for the last month or so" (04/30/2014)  . Daily headache   . Arthritis     left knee; back" (04/30/2014)  . Chronic back pain   . Herpes     Medications:  Scheduled:  . amLODipine  5 mg Oral Daily  . aspirin EC  81 mg Oral Daily  . enoxaparin (LOVENOX) injection  1 mg/kg Subcutaneous Q12H  . furosemide  20 mg Oral Daily  . hydrALAZINE  25 mg Oral BID  . loratadine  10 mg Oral Daily  . pantoprazole  40 mg Oral BID  . tamsulosin  0.4 mg Oral QPC supper   Infusions:    Assessment: 75yo male presents with CP and lower extremity edema. Pharmacy is consulted to dose lovenox for suspected VTE. Troponin trending upward since admission.  SCr down  trending. Total body weight 95kg. No s/sx of bleeding noted. Hgb and Pltc stable and wnl.     Goal of Therapy:  Monitor platelets by anticoagulation protocol: Yes   Plan:  -Lovenox 1mg /kg subcutaneously q12h - decreased to 95 mg q12h as pt's weight = 95kg -Monitor renal function and s/sx of bleeding -F/u long term anticoag   Carlean Jews, Pharm.D.  PGY1 Pharmacy Resident  04/20/2016,9:34 AM

## 2016-04-20 NOTE — Progress Notes (Signed)
Preliminary results by tech - Venous Duplex Lower Ext. Completed. Negative for deep and superficial vein thrombosis in both legs.  Kailer Heindel, BS, RDMS, RVT  

## 2016-04-20 NOTE — Progress Notes (Signed)
Troponin 0.04, no s/s was 0.03, MD notified, will continue monitor, Thanks Arvella Nigh RN

## 2016-04-20 NOTE — Progress Notes (Signed)
Patient ID: [name], male   DOB: September 20, 1941, 75 y.o.   MRN: [id number]    PROGRESS NOTE    [name]  [id number] DOB: 05/10/1941 DOA: 04/19/2016  PCP: Glenda Chroman, MD   Brief Narrative:  75 y.o. male with medical history significant of HTN, HLD, CAD s/p stents, laryngeal cancer s/p radiation, OSA; who woke up this morning with chest pressure across his chest. Symptoms seemed to radiate up into his neck. Pain worsened with changes in position especially with leaning forward. Associated symptoms include recent weight gain( unknown amount), lower leg swelling worse on the right than the left, urinary frequency.  Assessment & Plan:  1. Chest pain - mildly elevated troponin, cardiology consulted for assistance, plan for Myoview in AM.   2. CAD - continue ASA. Not on BB due to h/o bradycardia. (Coreg listed on prior med list but not taking this.) Keep on telemetry for now.  3. HTN, essential  - SBP in 150's this AM, monitor   4. Hypokalemia - mild, supplement, BMP in AM  5. Obesity - Body mass index is 31.85 kg/(m^2).  DVT prophylaxis: Full  Code Status: Full  Family Communication: Patient and wife at bedside  Disposition Plan: Home in AM  Consultants:   Cardiology   Procedures:   None  Antimicrobials:   None   Subjective: No chest pain this AM  Objective: Filed Vitals:   04/19/16 2230 04/19/16 2334 04/20/16 0500 04/20/16 0957  BP: 156/64 166/60 131/55 157/54  Pulse: 63 68 59 58  Temp:  98 F (36.7 C) 97.8 F (36.6 C) 97.7 F (36.5 C)  TempSrc:  Oral Oral Oral  Resp: 17 18 18 18   Height:  5\' 8"  (1.727 m)    Weight:  95.301 kg (210 lb 1.6 oz) 94.983 kg (209 lb 6.4 oz)   SpO2: 93% 99% 96% 94%    Intake/Output Summary (Last 24 hours) at 04/20/16 1238 Last data filed at 04/20/16 0907  Gross per 24 hour  Intake    460 ml  Output    525 ml  Net    -65 ml   Filed Weights   04/19/16 2334 04/20/16 0500  Weight: 95.301 kg (210 lb 1.6 oz) 94.983  kg (209 lb 6.4 oz)    Examination:  General exam: Appears calm and comfortable  Respiratory system: Clear to auscultation. Respiratory effort normal. Cardiovascular system: S1 & S2 heard, RRR. No JVD, murmurs, rubs, gallops or clicks. No pedal edema. Gastrointestinal system: Abdomen is nondistended, soft and nontender. No organomegaly or masses felt.  Central nervous system: Alert and oriented. No focal neurological deficits. Extremities: Symmetric 5 x 5 power. Skin: No rashes, lesions or ulcers Psychiatry: Judgement and insight appear normal. Mood & affect appropriate.    Data Reviewed: I have personally reviewed following labs and imaging studies  CBC:  Recent Labs Lab 04/19/16 1558 04/20/16 0352  WBC 6.1 5.4  NEUTROABS  --  2.8  HGB 14.7 14.1  HCT 44.4 42.8  MCV 86.9 86.1  PLT 280 [id number]   Basic Metabolic Panel:  Recent Labs Lab 04/19/16 1558 04/20/16 0352  NA 139 137  K 4.1 3.4*  CL 106 105  CO2 26 26  GLUCOSE 104* 107*  BUN 10 10  CREATININE 1.14 0.96  CALCIUM 9.7 9.3   Cardiac Enzymes:  Recent Labs Lab 04/20/16 0107 04/20/16 0352  TROPONINI 0.03* 0.04*   Lipid Profile:  Recent Labs  04/20/16 0352  CHOL 172  HDL  32*  LDLCALC 106*  TRIG 171*  CHOLHDL 5.4   Urine analysis:    Component Value Date/Time   COLORURINE YELLOW 03/19/2015 1711   APPEARANCEUR CLEAR 03/19/2015 1711   LABSPEC >1.030* 03/19/2015 1711   PHURINE 5.0 03/19/2015 1711   GLUCOSEU NEGATIVE 03/19/2015 1711   HGBUR TRACE* 03/19/2015 1711   BILIRUBINUR SMALL* 03/19/2015 1711   KETONESUR NEGATIVE 03/19/2015 1711   PROTEINUR TRACE* 03/19/2015 1711   UROBILINOGEN 0.2 03/19/2015 1711   NITRITE NEGATIVE 03/19/2015 1711   LEUKOCYTESUR NEGATIVE 03/19/2015 1711    Radiology Studies: Dg Chest 2 View  04/19/2016  CLINICAL DATA:  Central chest pain, throat pain, headache, shortness of Breath EXAM: CHEST  2 VIEW COMPARISON:  05/16/2014 FINDINGS: Cardiomediastinal silhouette is  stable. No acute infiltrate or pleural effusion. No pulmonary edema. Stable left basilar atelectasis or scarring. Osteopenia and degenerative changes thoracic spine. IMPRESSION: No active cardiopulmonary disease. Electronically Signed   By: Lahoma Crocker M.D.   On: 04/19/2016 17:12      Scheduled Meds: . amLODipine  5 mg Oral Daily  . aspirin EC  81 mg Oral Daily  . enoxaparin (LOVENOX) injection  1 mg/kg Subcutaneous Q12H  . furosemide  20 mg Oral Daily  . hydrALAZINE  25 mg Oral BID  . loratadine  10 mg Oral Daily  . pantoprazole  40 mg Oral BID  . tamsulosin  0.4 mg Oral QPC supper   Continuous Infusions:       Time spent: 20 minutes    Faye Ramsay, MD [facility] Pager [phone]  If 7PM-7AM, please contact night-coverage [email or web address] Password [email or web address] 04/20/2016, 12:38 PM

## 2016-04-21 ENCOUNTER — Observation Stay (HOSPITAL_COMMUNITY): Payer: Medicare Other

## 2016-04-21 ENCOUNTER — Observation Stay (HOSPITAL_BASED_OUTPATIENT_CLINIC_OR_DEPARTMENT_OTHER): Payer: Medicare Other

## 2016-04-21 ENCOUNTER — Encounter (HOSPITAL_COMMUNITY): Payer: Self-pay | Admitting: *Deleted

## 2016-04-21 DIAGNOSIS — I25118 Atherosclerotic heart disease of native coronary artery with other forms of angina pectoris: Secondary | ICD-10-CM

## 2016-04-21 DIAGNOSIS — R079 Chest pain, unspecified: Secondary | ICD-10-CM

## 2016-04-21 DIAGNOSIS — I10 Essential (primary) hypertension: Secondary | ICD-10-CM

## 2016-04-21 LAB — BASIC METABOLIC PANEL
Anion gap: 6 (ref 5–15)
BUN: 12 mg/dL (ref 6–20)
CHLORIDE: 104 mmol/L (ref 101–111)
CO2: 28 mmol/L (ref 22–32)
Calcium: 9.8 mg/dL (ref 8.9–10.3)
Creatinine, Ser: 1.02 mg/dL (ref 0.61–1.24)
GFR calc Af Amer: >60 mL/min (ref >60)
GFR calc non Af Amer: >60 mL/min (ref >60)
GLUCOSE: 103 mg/dL — AB (ref 65–99)
POTASSIUM: 3.9 mmol/L (ref 3.5–5.1)
Sodium: 138 mmol/L (ref 135–145)

## 2016-04-21 LAB — NM MYOCAR MULTI W/SPECT W/WALL MOTION / EF
CHL CUP RESTING HR STRESS: 64 {beats}/min
CSEPED: 5 min
Estimated workload: 1 METS
MPHR: 144 {beats}/min
Peak HR: 82 {beats}/min
Percent HR: 56 %

## 2016-04-21 LAB — CBC
HCT: 43.9 % (ref 39.0–52.0)
HEMOGLOBIN: 14.6 g/dL (ref 13.0–17.0)
MCH: 28.9 pg (ref 26.0–34.0)
MCHC: 33.3 g/dL (ref 30.0–36.0)
MCV: 86.8 fL (ref 78.0–100.0)
Platelets: 253 10*3/uL (ref 150–400)
RBC: 5.06 MIL/uL (ref 4.22–5.81)
RDW: 14.2 % (ref 11.5–15.5)
WBC: 5.8 10*3/uL (ref 4.0–10.5)

## 2016-04-21 MED ORDER — TECHNETIUM TC 99M TETROFOSMIN IV KIT
30.0000 | PACK | Freq: Once | INTRAVENOUS | Status: AC | PRN
  Administered 2016-04-21: 30 via INTRAVENOUS

## 2016-04-21 MED ORDER — AMLODIPINE BESYLATE 10 MG PO TABS
10.0000 mg | ORAL_TABLET | Freq: Every day | ORAL | Status: DC
  Administered 2016-04-22: 10 mg via ORAL
  Filled 2016-04-21: qty 1

## 2016-04-21 MED ORDER — AMLODIPINE BESYLATE 10 MG PO TABS
10.0000 mg | ORAL_TABLET | Freq: Every day | ORAL | Status: DC
Start: 2016-04-21 — End: 2022-07-19

## 2016-04-21 MED ORDER — TECHNETIUM TC 99M TETROFOSMIN IV KIT
10.0000 | PACK | Freq: Once | INTRAVENOUS | Status: AC | PRN
  Administered 2016-04-21: 10 via INTRAVENOUS

## 2016-04-21 MED ORDER — REGADENOSON 0.4 MG/5ML IV SOLN
0.4000 mg | Freq: Once | INTRAVENOUS | Status: AC
  Administered 2016-04-21: 0.4 mg via INTRAVENOUS
  Filled 2016-04-21: qty 5

## 2016-04-21 MED ORDER — REGADENOSON 0.4 MG/5ML IV SOLN
INTRAVENOUS | Status: AC
Start: 2016-04-21 — End: 2016-04-21
  Filled 2016-04-21: qty 5

## 2016-04-21 NOTE — Discharge Summary (Signed)
Physician Discharge Summary  [name] [id number] DOB: 1940-11-25 DOA: 04/19/2016  PCP: Glenda Chroman, MD  Admit date: 04/19/2016 Discharge date: 04/21/2016  Recommendations for Outpatient Follow-up:  1. Pt will need to follow up with PCP in 2-3 weeks post discharge  Discharge Diagnoses:  Principal Problem:   Chest pain Active Problems:   CAD (coronary artery disease), history of PTCA only of LCX in 2008 and 50% stenosis of RCA at that time and 50% LAD,    HTN (hypertension)   Lower extremity edema   Insomnia   BPH (benign prostatic hyperplasia)  Discharge Condition: Stable  Diet recommendation: Heart healthy diet discussed in details   Brief Narrative:  75 y.o. male with medical history significant of HTN, HLD, CAD s/p stents, laryngeal cancer s/p radiation, OSA; who woke up this morning with chest pressure across his chest. Symptoms seemed to radiate up into his neck. Pain worsened with changes in position especially with leaning forward. Associated symptoms include recent weight gain( unknown amount), lower leg swelling worse on the right than the left, urinary frequency.  Assessment & Plan:  1. Chest pain, likely musculoskeletal - mildly elevated troponin, Myoview with no acute infarctions, low risk study  2. CAD - continue ASA. Not on BB due to h/o bradycardia. (Coreg listed on prior med list but not taking this.) No event on tele. If cardiology OK, can d/c home in AM  3. HTN, essential - SBP in 150's this AM, monitor   4. Hypokalemia - mild, supplemented and WNL this AM  5. Obesity - Body mass index is 31.85 kg/(m^2).  DVT prophylaxis: Full  Code Status: Full  Family Communication: Patient and wife at bedside  Disposition Plan: Home in AM  Consultants:   Cardiology  Procedures:   None  Antimicrobials:   None  Procedures/Studies: Dg Chest 2 View  04/19/2016  CLINICAL DATA:  Central chest pain, throat pain, headache, shortness of Breath EXAM:  CHEST  2 VIEW COMPARISON:  05/16/2014 FINDINGS: Cardiomediastinal silhouette is stable. No acute infiltrate or pleural effusion. No pulmonary edema. Stable left basilar atelectasis or scarring. Osteopenia and degenerative changes thoracic spine. IMPRESSION: No active cardiopulmonary disease. Electronically Signed   By: Lahoma Crocker M.D.   On: 04/19/2016 17:12   Discharge Exam: Filed Vitals:   04/21/16 0614 04/21/16 0915  BP: 133/84 147/72  Pulse: 66   Temp: 97.7 F (36.5 C)   Resp: 18    Filed Vitals:   04/20/16 1419 04/20/16 2156 04/21/16 0614 04/21/16 0915  BP: 124/55 122/66 133/84 147/72  Pulse: 69 68 66   Temp: 97.8 F (36.6 C) 98.4 F (36.9 C) 97.7 F (36.5 C)   TempSrc: Oral Oral Oral   Resp: 18  18   Height:      Weight:   93.26 kg (205 lb 9.6 oz)   SpO2: 93% 95% 94%     General: Pt is alert, follows commands appropriately, not in acute distress Cardiovascular: Regular rate and rhythm, S1/S2 +, no rubs, no gallops Respiratory: Clear to auscultation bilaterally, no wheezing, no crackles, no rhonchi Abdominal: Soft, non tender, non distended, bowel sounds +, no guarding  Discharge Instructions     Medication List    STOP taking these medications        carvedilol 3.125 MG tablet  Commonly known as:  COREG     clopidogrel 75 MG tablet  Commonly known as:  PLAVIX      TAKE these medications  acyclovir 200 MG capsule  Commonly known as:  ZOVIRAX  Take 200 mg by mouth 2 (two) times daily.     amLODipine 10 MG tablet  Commonly known as:  NORVASC  Take 1 tablet (10 mg total) by mouth daily.     aspirin EC 81 MG tablet  Take 1 tablet (81 mg total) by mouth daily.     cetirizine 10 MG tablet  Commonly known as:  ZYRTEC  Take 10 mg by mouth daily as needed for allergies.     dexlansoprazole 60 MG capsule  Commonly known as:  DEXILANT  Take 60 mg by mouth daily.     docusate sodium 100 MG capsule  Commonly known as:  COLACE  Take 100 mg by mouth 2  (two) times daily.     etodolac 400 MG 24 hr tablet  Commonly known as:  LODINE XL  Take 400 mg by mouth daily.     fluticasone 50 MCG/ACT nasal spray  Commonly known as:  FLONASE  Place 2 sprays into the nose daily as needed for rhinitis.     furosemide 20 MG tablet  Commonly known as:  LASIX  Take 20 mg by mouth.     hydrALAZINE 25 MG tablet  Commonly known as:  APRESOLINE  Take 25 mg by mouth 2 (two) times daily.     LORazepam 1 MG tablet  Commonly known as:  ATIVAN  Take 1 mg by mouth at bedtime as needed for sleep.     nitroGLYCERIN 0.4 MG SL tablet  Commonly known as:  NITROSTAT  Place 0.4 mg under the tongue every 5 (five) minutes as needed for chest pain.     oxyCODONE-acetaminophen 5-325 MG tablet  Commonly known as:  PERCOCET/ROXICET  Take 1 tablet by mouth every 4 (four) hours as needed for severe pain.     pantoprazole 40 MG tablet  Commonly known as:  PROTONIX  Take 40 mg by mouth 2 (two) times daily.     tamsulosin 0.4 MG Caps capsule  Commonly known as:  FLOMAX  Take 0.4 mg by mouth.            Follow-up Information    Follow up with Glenda Chroman, MD.   Specialty:  Internal Medicine   Contact information:   [address] [place] [address] [phone]        The results of significant diagnostics from this hospitalization (including imaging, microbiology, ancillary and laboratory) are listed below for reference.     Microbiology: No results found for this or any previous visit (from the past 240 hour(s)).   Labs: Basic Metabolic Panel:  Recent Labs Lab 04/19/16 1558 04/20/16 0352 04/21/16 0029  NA 139 137 138  K 4.1 3.4* 3.9  CL 106 105 104  CO2 26 26 28   GLUCOSE 104* 107* 103*  BUN 10 10 12   CREATININE 1.14 0.96 1.02  CALCIUM 9.7 9.3 9.8   CBC:  Recent Labs Lab 04/19/16 1558 04/20/16 0352 04/21/16 0029  WBC 6.1 5.4 5.8  NEUTROABS  --  2.8  --   HGB 14.7 14.1 14.6  HCT 44.4 42.8 43.9  MCV 86.9 86.1 86.8  PLT 280  242 253   Cardiac Enzymes:  Recent Labs Lab 04/20/16 0107 04/20/16 0352  TROPONINI 0.03* 0.04*   BNP: BNP (last 3 results)  Recent Labs  04/20/16 0107  BNP 66.8    SIGNED: Time coordinating discharge: 30 minutes  MAGICK-Latavious Bitter, MD  [facility] 04/21/2016, 9:23 AM Pager [phone]  If 7PM-7AM, please contact night-coverage [email or web address] Password [email or web address]

## 2016-04-21 NOTE — Progress Notes (Signed)
SUBJECTIVE: Denies CP and SOB. Stress test completed, awaiting interpretation by radiology.   ROS: Other than pertinent positives in "Subjective", all others were reviewed and found to be negative.   Intake/Output Summary (Last 24 hours) at 04/21/16 1211 Last data filed at 04/21/16 0802  Gross per 24 hour  Intake    720 ml  Output   1450 ml  Net   -730 ml    Current Facility-Administered Medications  Medication Dose Route Frequency Provider Last Rate Last Dose  . acetaminophen (TYLENOL) tablet 650 mg  650 mg Oral Q4H PRN Norval Morton, MD   650 mg at 04/20/16 2246  . amLODipine (NORVASC) tablet 5 mg  5 mg Oral Daily Norval Morton, MD   5 mg at 04/21/16 1059  . aspirin EC tablet 81 mg  81 mg Oral Daily Rondell Charmayne Sheer, MD   81 mg at 04/21/16 1059  . enoxaparin (LOVENOX) injection 95 mg  1 mg/kg Subcutaneous Q12H Carlean Jews, RPH   95 mg at 04/20/16 2135  . fluticasone (FLONASE) 50 MCG/ACT nasal spray 2 spray  2 spray Each Nare Daily PRN Norval Morton, MD      . furosemide (LASIX) tablet 20 mg  20 mg Oral Daily Norval Morton, MD   20 mg at 04/21/16 1059  . gi cocktail (Maalox,Lidocaine,Donnatal)  30 mL Oral QID PRN Norval Morton, MD      . hydrALAZINE (APRESOLINE) tablet 25 mg  25 mg Oral BID Norval Morton, MD   25 mg at 04/21/16 1059  . loratadine (CLARITIN) tablet 10 mg  10 mg Oral Daily Norval Morton, MD   10 mg at 04/21/16 1059  . LORazepam (ATIVAN) tablet 1 mg  1 mg Oral QHS PRN Norval Morton, MD   1 mg at 04/20/16 2244  . morphine 2 MG/ML injection 2 mg  2 mg Intravenous Q2H PRN Norval Morton, MD      . nitroGLYCERIN (NITROSTAT) SL tablet 0.4 mg  0.4 mg Sublingual Q5 min PRN Norval Morton, MD      . ondansetron (ZOFRAN) injection 4 mg  4 mg Intravenous Q6H PRN Rondell A Tamala Julian, MD      . pantoprazole (PROTONIX) EC tablet 40 mg  40 mg Oral BID Norval Morton, MD   40 mg at 04/21/16 1059  . regadenoson (LEXISCAN) 0.4 MG/5ML injection SOLN            . tamsulosin (FLOMAX) capsule 0.4 mg  0.4 mg Oral QPC supper Norval Morton, MD   0.4 mg at 04/20/16 1738    Filed Vitals:   04/21/16 0614 04/21/16 0915 04/21/16 1002 04/21/16 1005  BP: 133/84 147/72 149/68 155/69  Pulse: 66     Temp: 97.7 F (36.5 C)     TempSrc: Oral     Resp: 18  24 20   Height:      Weight: 205 lb 9.6 oz (93.26 kg)     SpO2: 94%       PHYSICAL EXAM General: NAD HEENT: Normal. Neck: No JVD, no thyromegaly.  Lungs: Clear to auscultation bilaterally with normal respiratory effort. CV: Nondisplaced PMI.  Regular rate and rhythm, normal S1/S2, no S3/S4, no murmur.  No pretibial edema. Normal pedal pulses.  Abdomen: Soft, nontender, no distention.  Neurologic: Alert and oriented x 3.  Psych: Normal affect. Musculoskeletal: Normal range of motion. No gross deformities. Extremities: No clubbing or cyanosis.  LABS: Basic Metabolic Panel:  Recent Labs  04/20/16 0352 04/21/16 0029  NA 137 138  K 3.4* 3.9  CL 105 104  CO2 26 28  GLUCOSE 107* 103*  BUN 10 12  CREATININE 0.96 1.02  CALCIUM 9.3 9.8   Liver Function Tests: No results for input(s): AST, ALT, ALKPHOS, BILITOT, PROT, ALBUMIN in the last 72 hours. No results for input(s): LIPASE, AMYLASE in the last 72 hours. CBC:  Recent Labs  04/20/16 0352 04/21/16 0029  WBC 5.4 5.8  NEUTROABS 2.8  --   HGB 14.1 14.6  HCT 42.8 43.9  MCV 86.1 86.8  PLT 242 253   Cardiac Enzymes:  Recent Labs  04/20/16 0107 04/20/16 0352  TROPONINI 0.03* 0.04*   BNP: Invalid input(s): POCBNP D-Dimer:  Recent Labs  04/19/16 1558  DDIMER 0.61*   Hemoglobin A1C: No results for input(s): HGBA1C in the last 72 hours. Fasting Lipid Panel:  Recent Labs  04/20/16 0352  CHOL 172  HDL 32*  LDLCALC 106*  TRIG 171*  CHOLHDL 5.4   Thyroid Function Tests: No results for input(s): TSH, T4TOTAL, T3FREE, THYROIDAB in the last 72 hours.  Invalid input(s): FREET3 Anemia Panel: No results for  input(s): VITAMINB12, FOLATE, FERRITIN, TIBC, IRON, RETICCTPCT in the last 72 hours.  RADIOLOGY: Dg Chest 2 View  04/19/2016  CLINICAL DATA:  Central chest pain, throat pain, headache, shortness of Breath EXAM: CHEST  2 VIEW COMPARISON:  05/16/2014 FINDINGS: Cardiomediastinal silhouette is stable. No acute infiltrate or pleural effusion. No pulmonary edema. Stable left basilar atelectasis or scarring. Osteopenia and degenerative changes thoracic spine. IMPRESSION: No active cardiopulmonary disease. Electronically Signed   By: Lahoma Crocker M.D.   On: 04/19/2016 17:12      ASSESSMENT AND PLAN: 20M with CAD (reported PTCA to Cx 2008 by Dr. Einar Gip, DES to LAD 2014 with residual 60-70% mRCA & 60% Cx treated medically), HTN, HLD, GERD, OSA, arthritis (limiting activity level), laryngeal CA s/p radiation presented with chest pain for several months, occurring in the morning, worse upon admission and lasting all day long.  1. Chest pain - mildly elevated troponin is nonspecific, flat trend despite persistent chest pain all day long upon admission. Symptoms atypical. Morning pattern raises question of arthritic component, more compatible with musculoskeletal etiology. Echocardiogram showed normal LV systolic function. Await nuclear stress test results.  2. CAD - continue ASA. Not on BB due to h/o bradycardia. (Coreg listed on prior med list but not taking this.) Can address statin in the OP setting (do not want to confuse picture with possible MSK/joint chest pain).  3. Essential HTN - BP running higher this admission. Will increase amlodipine to 10mg  daily.   Kate Sable, M.D., F.A.C.C.

## 2016-04-21 NOTE — Discharge Instructions (Signed)

## 2016-04-22 LAB — CBC
HEMATOCRIT: 46 % (ref 39.0–52.0)
Hemoglobin: 15.3 g/dL (ref 13.0–17.0)
MCH: 28.7 pg (ref 26.0–34.0)
MCHC: 33.3 g/dL (ref 30.0–36.0)
MCV: 86.1 fL (ref 78.0–100.0)
Platelets: 288 10*3/uL (ref 150–400)
RBC: 5.34 MIL/uL (ref 4.22–5.81)
RDW: 14.1 % (ref 11.5–15.5)
WBC: 7.3 10*3/uL (ref 4.0–10.5)

## 2016-04-22 LAB — BASIC METABOLIC PANEL
ANION GAP: 9 (ref 5–15)
BUN: 12 mg/dL (ref 6–20)
CALCIUM: 9.6 mg/dL (ref 8.9–10.3)
CO2: 25 mmol/L (ref 22–32)
Chloride: 103 mmol/L (ref 101–111)
Creatinine, Ser: 0.98 mg/dL (ref 0.61–1.24)
GFR calc Af Amer: >60 mL/min (ref >60)
GFR calc non Af Amer: >60 mL/min (ref >60)
GLUCOSE: 111 mg/dL — AB (ref 65–99)
Potassium: 3.5 mmol/L (ref 3.5–5.1)
Sodium: 137 mmol/L (ref 135–145)

## 2016-04-22 NOTE — Discharge Summary (Signed)
Physician Discharge Summary  [name] [id number] DOB: [birth year] DOA: 04/19/2016  PCP: Glenda Chroman, MD  Admit date: 04/19/2016 Discharge date: 04/22/2016  Recommendations for Outpatient Follow-up:  1. Pt will need to follow up with PCP in 2-3 weeks post discharge  Discharge Diagnoses:  Principal Problem:   Chest pain Active Problems:   CAD (coronary artery disease), history of PTCA only of LCX in 2008 and 50% stenosis of RCA at that time and 50% LAD,    HTN (hypertension)   Lower extremity edema   Insomnia   BPH (benign prostatic hyperplasia)   Pain in the chest  Discharge Condition: Stable  Diet recommendation: Heart healthy diet discussed in details   Brief Narrative:  75 y.o. male with medical history significant of HTN, HLD, CAD s/p stents, laryngeal cancer s/p radiation, OSA; who woke up this morning with chest pressure across his chest. Symptoms seemed to radiate up into his neck. Pain worsened with changes in position especially with leaning forward. Associated symptoms include recent weight gain( unknown amount), lower leg swelling worse on the right than the left, urinary frequency.  Assessment & Plan:  1. Chest pain, likely musculoskeletal - mildly elevated troponin, Myoview with no acute infarctions, low risk study  2. CAD - continue ASA. Not on BB due to h/o bradycardia. (Coreg listed on prior med list but not taking this.) No event on tele. If cardiology OK, can d/c home in AM  3. HTN, essential - SBP in 150's this AM, monitor   4. Hypokalemia - mild, supplemented and WNL this AM  5. Obesity - Body mass index is 31.85 kg/(m^2).  DVT prophylaxis: Full  Code Status: Full  Family Communication: Patient and wife at bedside  Disposition Plan: Home   Consultants:   Cardiology  Procedures:   None  Antimicrobials:   None  Procedures/Studies: Dg Chest 2 View  Result Date: 04/19/2016 CLINICAL DATA:  Central chest pain, throat pain, headache,  shortness of Breath EXAM: CHEST  2 VIEW COMPARISON:  05/16/2014 FINDINGS: Cardiomediastinal silhouette is stable. No acute infiltrate or pleural effusion. No pulmonary edema. Stable left basilar atelectasis or scarring. Osteopenia and degenerative changes thoracic spine. IMPRESSION: No active cardiopulmonary disease. Electronically Signed   By: Lahoma Crocker M.D.   On: 04/19/2016 17:12   Nm Myocar Multi W/spect W/wall Motion / Ef  Result Date: 04/21/2016 CLINICAL DATA:  75 year old male with chest pain for several months, bradycardia and mildly elevated cardiac enzymes EXAM: MYOCARDIAL IMAGING WITH SPECT (REST AND PHARMACOLOGIC-STRESS) GATED LEFT VENTRICULAR WALL MOTION STUDY LEFT VENTRICULAR EJECTION FRACTION TECHNIQUE: Standard myocardial SPECT imaging was performed after resting intravenous injection of 10 mCi Tc-90m tetrofosmin. Subsequently, intravenous infusion of Lexiscan was performed under the supervision of the Cardiology staff. At peak effect of the drug, 30 mCi Tc-29m tetrofosmin was injected intravenously and standard myocardial SPECT imaging was performed. Quantitative gated imaging was also performed to evaluate left ventricular wall motion, and estimate left ventricular ejection fraction. COMPARISON:  Chest x-ray 04/19/2016 FINDINGS: Perfusion: No decreased activity in the left ventricle on stress imaging to suggest reversible ischemia or infarction. Wall Motion: Normal left ventricular wall motion. No left ventricular dilation. Left Ventricular Ejection Fraction: 58 % End diastolic volume 123456 ml End systolic volume 44 ml IMPRESSION: 1. No reversible ischemia or infarction. 2. Normal left ventricular wall motion. 3. Left ventricular ejection fraction 58% 4. Non invasive risk stratification*: Low *2012 Appropriate Use Criteria for Coronary Revascularization Focused Update: J Am Coll Cardiol. N6492421. http://content.airportbarriers.com.aspx?articleid=[email or web address]  Electronically Signed   By:  Jacqulynn Cadet M.D.   On: 04/21/2016 12:10   Discharge Exam: Vitals:   04/22/16 0447 04/22/16 0831  BP: 129/62 123/67  Pulse: 62   Resp: 18   Temp: 98 F (36.7 C)    Vitals:   04/21/16 1300 04/21/16 2137 04/22/16 0447 04/22/16 0831  BP: (!) 124/59 137/67 129/62 123/67  Pulse: 72 72 62   Resp: 20 20 18    Temp: 98 F (36.7 C) 97.8 F (36.6 C) 98 F (36.7 C)   TempSrc: Oral Oral Oral   SpO2: 94% 96% 97% 95%  Weight:   93.2 kg (205 lb 8 oz)   Height:        General: Pt is alert, follows commands appropriately, not in acute distress Cardiovascular: Regular rate and rhythm, S1/S2 +, no rubs, no gallops Respiratory: Clear to auscultation bilaterally, no wheezing, no crackles, no rhonchi Abdominal: Soft, non tender, non distended, bowel sounds +, no guarding  Discharge Instructions  Discharge Instructions    Call MD for:  persistant nausea and vomiting    Complete by:  As directed   Call MD for:  severe uncontrolled pain    Complete by:  As directed   Call MD for:  temperature >100.4    Complete by:  As directed   Diet - low sodium heart healthy    Complete by:  As directed   Diet - low sodium heart healthy    Complete by:  As directed   Increase activity slowly    Complete by:  As directed   Increase activity slowly    Complete by:  As directed       Medication List    STOP taking these medications   carvedilol 3.125 MG tablet Commonly known as:  COREG   clopidogrel 75 MG tablet Commonly known as:  PLAVIX     TAKE these medications   acyclovir 200 MG capsule Commonly known as:  ZOVIRAX Take 200 mg by mouth 2 (two) times daily.   amLODipine 10 MG tablet Commonly known as:  NORVASC Take 1 tablet (10 mg total) by mouth daily. What changed:  medication strength  how much to take   aspirin EC 81 MG tablet Take 1 tablet (81 mg total) by mouth daily.   cetirizine 10 MG tablet Commonly known as:  ZYRTEC Take 10 mg by mouth daily as needed for allergies.    dexlansoprazole 60 MG capsule Commonly known as:  DEXILANT Take 60 mg by mouth daily.   docusate sodium 100 MG capsule Commonly known as:  COLACE Take 100 mg by mouth 2 (two) times daily.   etodolac 400 MG 24 hr tablet Commonly known as:  LODINE XL Take 400 mg by mouth daily.   fluticasone 50 MCG/ACT nasal spray Commonly known as:  FLONASE Place 2 sprays into the nose daily as needed for rhinitis.   furosemide 20 MG tablet Commonly known as:  LASIX Take 20 mg by mouth.   hydrALAZINE 25 MG tablet Commonly known as:  APRESOLINE Take 25 mg by mouth 2 (two) times daily.   LORazepam 1 MG tablet Commonly known as:  ATIVAN Take 1 mg by mouth at bedtime as needed for sleep.   nitroGLYCERIN 0.4 MG SL tablet Commonly known as:  NITROSTAT Place 0.4 mg under the tongue every 5 (five) minutes as needed for chest pain.   oxyCODONE-acetaminophen 5-325 MG tablet Commonly known as:  PERCOCET/ROXICET Take 1 tablet by mouth every 4 (  four) hours as needed for severe pain.   pantoprazole 40 MG tablet Commonly known as:  PROTONIX Take 40 mg by mouth 2 (two) times daily.   tamsulosin 0.4 MG Caps capsule Commonly known as:  FLOMAX Take 0.4 mg by mouth.       Follow-up Information    Glenda Chroman, MD.   Specialty:  Internal Medicine Contact information: [place] [address] [phone]        Call Faye Ramsay, MD.   Specialty:  Internal Medicine Why:  As needed [phone] Contact information: [address] [place] [address] [phone]            The results of significant diagnostics from this hospitalization (including imaging, microbiology, ancillary and laboratory) are listed below for reference.     Microbiology: No results found for this or any previous visit (from the past 240 hour(s)).   Labs: Basic Metabolic Panel:  Recent Labs Lab 04/19/16 1558 04/20/16 0352 04/21/16 0029 04/22/16 0326  NA 139 137  138 137  K 4.1 3.4* 3.9 3.5  CL 106 105 104 103  CO2 26 26 28 25   GLUCOSE 104* 107* 103* 111*  BUN 10 10 12 12   CREATININE 1.14 0.96 1.02 0.98  CALCIUM 9.7 9.3 9.8 9.6   CBC:  Recent Labs Lab 04/19/16 1558 04/20/16 0352 04/21/16 0029 04/22/16 0326  WBC 6.1 5.4 5.8 7.3  NEUTROABS  --  2.8  --   --   HGB 14.7 14.1 14.6 15.3  HCT 44.4 42.8 43.9 46.0  MCV 86.9 86.1 86.8 86.1  PLT 280 242 253 288   Cardiac Enzymes:  Recent Labs Lab 04/20/16 0107 04/20/16 0352  TROPONINI 0.03* 0.04*   BNP: BNP (last 3 results)  Recent Labs  04/20/16 0107  BNP 66.8    SIGNED: Time coordinating discharge: 30 minutes  Faye Ramsay, MD  [facility] 04/22/2016, 10:05 AM Pager [phone]  If 7PM-7AM, please contact night-coverage [email or web address] Password [email or web address]

## 2017-05-17 ENCOUNTER — Encounter: Payer: Self-pay | Admitting: Allergy

## 2017-05-17 ENCOUNTER — Ambulatory Visit (INDEPENDENT_AMBULATORY_CARE_PROVIDER_SITE_OTHER): Payer: Non-veteran care | Admitting: Allergy

## 2017-05-17 VITALS — BP 128/68 | HR 47 | Resp 16 | Ht 65.5 in | Wt 220.4 lb

## 2017-05-17 DIAGNOSIS — J3089 Other allergic rhinitis: Secondary | ICD-10-CM

## 2017-05-17 DIAGNOSIS — J310 Chronic rhinitis: Secondary | ICD-10-CM

## 2017-05-17 MED ORDER — AZELASTINE HCL 0.1 % NA SOLN: 2.0000 | Freq: Two times a day (BID) | NASAL | 5 refills | Status: DC
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
## 2017-05-17 NOTE — Patient Instructions (Addendum)
Allergic rhinitis and Gustatory rhinitis    - recommend use of Ipratriopium nasal spray prior to meals to help decrease nasal drainage/drip with eating    - recommend use of Astelin (antihistamine nasal spray) 2 sprays twice a day to help decrease overall nasal drainage and post-nasal drip.      - May try changing from Zyrtec to Xyzal to see if this is better control of your allergy symptoms    - if the above regimen does not help then may recommmend repeat allergy testing and possibly resuming allergy shots   Follow-up 4-6 months (may follow-up in [place] if more convenient-- we are open on Tuesdays there)

## 2017-10-14 ENCOUNTER — Encounter: Payer: Self-pay | Admitting: Gastroenterology

## 2017-10-28 ENCOUNTER — Telehealth: Payer: Self-pay

## 2017-10-28 NOTE — Telephone Encounter (Signed)
Opened in error

## 2017-10-30 ENCOUNTER — Other Ambulatory Visit: Payer: Self-pay | Admitting: *Deleted

## 2017-10-30 ENCOUNTER — Encounter: Payer: Self-pay | Admitting: *Deleted

## 2017-10-30 ENCOUNTER — Encounter: Payer: Self-pay | Admitting: Gastroenterology

## 2017-10-30 ENCOUNTER — Ambulatory Visit (INDEPENDENT_AMBULATORY_CARE_PROVIDER_SITE_OTHER): Payer: Non-veteran care | Admitting: Gastroenterology

## 2017-10-30 DIAGNOSIS — K219 Gastro-esophageal reflux disease without esophagitis: Secondary | ICD-10-CM | POA: Diagnosis not present

## 2017-10-30 DIAGNOSIS — R1013 Epigastric pain: Secondary | ICD-10-CM
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
## 2017-10-30 NOTE — Patient Instructions (Addendum)
[name]. MEATS SHOULD BE BAKED, BROILED, OR BOILED. AVOID FIRED FOODS. SEE INFO BELOW.   AVOID REFLUX TRIGGERS. SEE INFO BELOW.  CONTINUE PROTONIX. IT WORKS BEST IF YOU TAKE IT 30 MINUTES PRIOR TO MEALS TWICE DAILY.  COMPLETE UPPER ENDOSCOPY WITH POSSIBLE ESOPHAGEAL DILATION IN 2-3 WEEKS.   Zachary UP WITH ENT CONCERNING THE ANTERIOR NASAL DRAINAGE.  Zachary UP IN 4 MOS.    Lifestyle and home remedies TO MANAGE REFLUX/CHEST PAIN  You may eliminate or reduce the frequency of heartburn by making the following lifestyle changes:  . Control your weight. Being overweight is a major risk factor for heartburn and GERD. Excess pounds put pressure on your abdomen, pushing up your stomach and causing acid to back up into your esophagus.   . Eat smaller meals. 4 TO 6 MEALS A DAY. This reduces pressure on the lower esophageal sphincter, helping to prevent the valve from opening and acid from washing back into your esophagus.   Dolphus Jenny your belt. Clothes that fit tightly around your waist put pressure on your abdomen and the lower esophageal sphincter.   . Eliminate heartburn triggers. Everyone has specific triggers. Common triggers such as fatty or fried foods, spicy food, tomato sauce, carbonated beverages, alcohol, chocolate, mint, garlic, onion, caffeine and nicotine may make heartburn worse.   Marland Kitchen Avoid stooping or bending. Tying your shoes is OK. Bending over for longer periods to weed your garden isn't, especially soon after eating.   . Don't lie down after a meal. Wait at least three to four hours after eating before going to bed, and don't lie down right after eating.   Marland Kitchen PUT THE HEAD OF YOUR BED ON 6 INCH BLOCKS OR SLEEP WITH YOUR HEAD ABOVE YOUR HEART.   Alternative medicine . Several home remedies exist for treating GERD, but they provide only temporary relief. They include drinking baking soda (sodium bicarbonate) added to water or drinking other fluids such as baking  soda mixed with cream of tartar and water.  . Although these liquids create temporary relief by neutralizing, washing away or buffering acids, eventually they aggravate the situation by adding gas and fluid to your stomach, increasing pressure and causing more acid reflux. Further, adding more sodium to your Smith may increase your blood pressure and add stress to your heart, and excessive bicarbonate ingestion can alter the acid-base balance in your body.      Low-Fat Smith BREADS, CEREALS, PASTA, RICE, DRIED PEAS, AND BEANS These products are high in carbohydrates and most are low in fat. Therefore, they can be increased in the Smith as substitutes for fatty foods. They too, however, contain calories and should not be eaten in excess. Cereals can be eaten for snacks as well as for breakfast.   FRUITS AND VEGETABLES It is good to eat fruits and vegetables. Besides being sources of fiber, both are rich in vitamins and some minerals. They help you get the daily allowances of these nutrients. Fruits and vegetables can be used for snacks and desserts.  MEATS Limit lean meat, chicken, [place], and fish to no more than 6 ounces per day. Beef, Pork, and Lamb Use lean cuts of beef, pork, and lamb. Lean cuts include:  Extra-lean ground beef.  Arm roast.  Sirloin tip.  Center-cut ham.  Round steak.  Loin chops.  Rump roast.  Tenderloin.  Trim all fat off the outside of meats before cooking. It is not necessary to severely decrease the intake of red  meat, but lean choices should be made. Lean meat is rich in protein and contains a highly absorbable form of iron. Premenopausal women, in particular, should avoid reducing lean red meat because this could increase the risk for low red blood cells (iron-deficiency anemia).  Chicken and [place] These are good sources of protein. The fat of poultry can be reduced by removing the skin and underlying fat layers before cooking. Chicken and [place] can be  substituted for lean red meat in the Smith. Poultry should not be fried or covered with high-fat sauces. Fish and Shellfish Fish is a good source of protein. Shellfish contain cholesterol, but they usually are low in saturated fatty acids. The preparation of fish is important. Like chicken and [place], they should not be fried or covered with high-fat sauces. EGGS Egg whites contain no fat or cholesterol. They can be eaten often. Try 1 to 2 egg whites instead of whole eggs in recipes or use egg substitutes that do not contain yolk. MILK AND DAIRY PRODUCTS Use skim or 1% milk instead of 2% or whole milk. Decrease whole milk, natural, and processed cheeses. Use nonfat or low-fat (2%) cottage cheese or low-fat cheeses made from vegetable oils. Choose nonfat or low-fat (1 to 2%) yogurt. Experiment with evaporated skim milk in recipes that call for heavy cream. Substitute low-fat yogurt or low-fat cottage cheese for sour cream in dips and salad dressings. Have at least 2 servings of low-fat dairy products, such as 2 glasses of skim (or 1%) milk each day to help get your daily calcium intake. FATS AND OILS Reduce the total intake of fats, especially saturated fat. Butterfat, lard, and beef fats are high in saturated fat and cholesterol. These should be avoided as much as possible. Vegetable fats do not contain cholesterol, but certain vegetable fats, such as coconut oil, palm oil, and palm kernel oil are very high in saturated fats. These should be limited. These fats are often used in bakery goods, processed foods, popcorn, oils, and nondairy creamers. Vegetable shortenings and some peanut butters contain hydrogenated oils, which are also saturated fats. Read the labels on these foods and check for saturated vegetable oils. Unsaturated vegetable oils and fats do not raise blood cholesterol. However, they should be limited because they are fats and are high in calories. Total fat should still be limited to 30% of  your daily caloric intake. Desirable liquid vegetable oils are corn oil, cottonseed oil, olive oil, canola oil, safflower oil, soybean oil, and sunflower oil. Peanut oil is not as good, but small amounts are acceptable. Buy a heart-healthy tub margarine that has no partially hydrogenated oils in the ingredients. Mayonnaise and salad dressings often are made from unsaturated fats, but they should also be limited because of their high calorie and fat content. Seeds, nuts, peanut butter, olives, and avocados are high in fat, but the fat is mainly the unsaturated type. These foods should be limited mainly to avoid excess calories and fat. OTHER EATING TIPS Snacks  Most sweets should be limited as snacks. They tend to be rich in calories and fats, and their caloric content outweighs their nutritional value. Some good choices in snacks are graham crackers, melba toast, soda crackers, bagels (no egg), English muffins, fruits, and vegetables. These snacks are preferable to snack crackers, [place] fries, TORTILLA CHIPS, and POTATO chips. Popcorn should be air-popped or cooked in small amounts of liquid vegetable oil. Desserts Eat fruit, low-fat yogurt, and fruit ices instead of pastries, cake, and cookies.  Sherbet, angel food cake, gelatin dessert, frozen low-fat yogurt, or other frozen products that do not contain saturated fat (pure fruit juice bars, frozen ice pops) are also acceptable.  COOKING METHODS Choose those methods that use little or no fat. They include: Poaching.  Braising.  Steaming.  Grilling.  Baking.  Stir-frying.  Broiling.  Microwaving.  Foods can be cooked in a nonstick pan without added fat, or use a nonfat cooking spray in regular cookware. Limit fried foods and avoid frying in saturated fat. Add moisture to lean meats by using water, broth, cooking wines, and other nonfat or low-fat sauces along with the cooking methods mentioned above. Soups and stews should be chilled after cooking.  The fat that forms on top after a few hours in the refrigerator should be skimmed off. When preparing meals, avoid using excess salt. Salt can contribute to raising blood pressure in some people.  EATING AWAY FROM HOME Order entres, potatoes, and vegetables without sauces or butter. When meat exceeds the size of a deck of cards (3 to 4 ounces), the rest can be taken home for another meal. Choose vegetable or fruit salads and ask for low-calorie salad dressings to be served on the side. Use dressings sparingly. Limit high-fat toppings, such as bacon, crumbled eggs, cheese, sunflower seeds, and olives. Ask for heart-healthy tub margarine instead of butter.

## 2017-10-30 NOTE — Assessment & Plan Note (Signed)
SYMPTOMS NOT IDEALLY CONTROLLED AND MAY RELATED TO A HIATAL HERNIA OR ESOPHAGEAL STRICTURE, LESS LIKELY A MASS.  FOLLOW A LOW FAT DIET. MEATS SHOULD BE BAKED, BROILED, OR BOILED. AVOID FIRED FOODS.  HANDOUT GIVEN. AVOID REFLUX TRIGGERS.  HANDOUT GIVEN. CONTINUE PROTONIX. IT WORKS BEST IF YOU TAKE IT 30 MINUTES PRIOR TO MEALS TWICE DAILY. COMPLETE UPPER ENDOSCOPY WITH POSSIBLE ESOPHAGEAL DILATION IN 2-3 WEEKS. DISCUSSED PROCEDURE, BENEFITS, & RISKS: < 1% chance of medication reaction, bleeding, OR perforation. FOLLOW UP WITH ENT CONCERNING THE ANTERIOR NASAL DRAINAGE.  FOLLOW UP IN 4 MOS.

## 2017-10-31 NOTE — Progress Notes (Signed)
cc'ed to pcp °

## 2017-10-31 NOTE — Progress Notes (Signed)
ON RECALL  °

## 2017-11-20 ENCOUNTER — Telehealth: Payer: Self-pay

## 2017-11-20 NOTE — Telephone Encounter (Signed)
Called pt, EGD/-/+Dil time changed to 11:30am tomorrow. He will arrive at 10:30am. Advised him to be NPO after midnight. LMOVM and informed Endo scheduler.

## 2017-11-21 ENCOUNTER — Other Ambulatory Visit: Payer: Self-pay

## 2017-11-21 ENCOUNTER — Encounter (HOSPITAL_COMMUNITY)
Admission: RE | Disposition: A | Discharge status: Home / Self Care | Payer: Self-pay | Source: Ambulatory Visit | Attending: Gastroenterology

## 2017-11-21 ENCOUNTER — Encounter (HOSPITAL_COMMUNITY): Payer: Self-pay | Admitting: *Deleted

## 2017-11-21 ENCOUNTER — Ambulatory Visit (HOSPITAL_COMMUNITY)
Admission: RE | Admit: 2017-11-21 | Discharge: 2017-11-21 | Disposition: A | Discharge status: Home / Self Care | Payer: Non-veteran care | Source: Ambulatory Visit | Attending: Gastroenterology | Admitting: Gastroenterology

## 2017-11-21 DIAGNOSIS — R1310 Dysphagia, unspecified: Secondary | ICD-10-CM | POA: Diagnosis not present

## 2017-11-21 DIAGNOSIS — K219 Gastro-esophageal reflux disease without esophagitis: Secondary | ICD-10-CM | POA: Diagnosis not present

## 2017-11-21 DIAGNOSIS — G4733 Obstructive sleep apnea (adult) (pediatric): Secondary | ICD-10-CM | POA: Insufficient documentation

## 2017-11-21 DIAGNOSIS — K2950 Unspecified chronic gastritis without bleeding: Secondary | ICD-10-CM | POA: Insufficient documentation

## 2017-11-21 DIAGNOSIS — R490 Dysphonia: Secondary | ICD-10-CM | POA: Diagnosis not present

## 2017-11-21 DIAGNOSIS — K2970 Gastritis, unspecified, without bleeding: Secondary | ICD-10-CM | POA: Diagnosis not present

## 2017-11-21 DIAGNOSIS — Z7982 Long term (current) use of aspirin: Secondary | ICD-10-CM | POA: Insufficient documentation

## 2017-11-21 DIAGNOSIS — E785 Hyperlipidemia, unspecified: Secondary | ICD-10-CM | POA: Insufficient documentation

## 2017-11-21 DIAGNOSIS — I10 Essential (primary) hypertension: Secondary | ICD-10-CM | POA: Diagnosis not present

## 2017-11-21 DIAGNOSIS — Z923 Personal history of irradiation: Secondary | ICD-10-CM | POA: Insufficient documentation

## 2017-11-21 DIAGNOSIS — Z8521 Personal history of malignant neoplasm of larynx: Secondary | ICD-10-CM | POA: Insufficient documentation

## 2017-11-21 DIAGNOSIS — T39395A Adverse effect of other nonsteroidal anti-inflammatory drugs [NSAID], initial encounter: Secondary | ICD-10-CM | POA: Diagnosis not present

## 2017-11-21 DIAGNOSIS — Q394 Esophageal web: Secondary | ICD-10-CM | POA: Diagnosis not present

## 2017-11-21 DIAGNOSIS — R1013 Epigastric pain: Secondary | ICD-10-CM

## 2017-11-21 DIAGNOSIS — K222 Esophageal obstruction: Secondary | ICD-10-CM | POA: Diagnosis not present

## 2017-11-21 DIAGNOSIS — M1712 Unilateral primary osteoarthritis, left knee: Secondary | ICD-10-CM | POA: Diagnosis not present

## 2017-11-21 DIAGNOSIS — Z955 Presence of coronary angioplasty implant and graft: Secondary | ICD-10-CM | POA: Diagnosis not present

## 2017-11-21 DIAGNOSIS — I2510 Atherosclerotic heart disease of native coronary artery without angina pectoris: Secondary | ICD-10-CM | POA: Diagnosis not present

## 2017-11-21 DIAGNOSIS — K317 Polyp of stomach and duodenum: Secondary | ICD-10-CM | POA: Insufficient documentation

## 2017-11-21 DIAGNOSIS — R1319 Other dysphagia: Secondary | ICD-10-CM

## 2017-11-21 DIAGNOSIS — Z79899 Other long term (current) drug therapy: Secondary | ICD-10-CM | POA: Diagnosis not present

## 2017-11-21 DIAGNOSIS — M479 Spondylosis, unspecified: Secondary | ICD-10-CM | POA: Insufficient documentation

## 2017-11-21 HISTORY — PX: SAVORY DILATION: SHX5439

## 2017-11-21 HISTORY — PX: ESOPHAGOGASTRODUODENOSCOPY: SHX5428

## 2017-11-21 SURGERY — EGD (ESOPHAGOGASTRODUODENOSCOPY)
Anesthesia: Moderate Sedation

## 2017-11-21 MED ORDER — LIDOCAINE VISCOUS 2 % MT SOLN
OROMUCOSAL | Status: DC | PRN
  Administered 2017-11-21: 1 via OROMUCOSAL

## 2017-11-21 MED ORDER — MIDAZOLAM HCL 5 MG/5ML IJ SOLN
INTRAMUSCULAR | Status: DC | PRN
  Administered 2017-11-21 (×2): 2 mg via INTRAVENOUS

## 2017-11-21 MED ORDER — SODIUM CHLORIDE 0.9 % IV SOLN
INTRAVENOUS | Status: DC
  Administered 2017-11-21: 11:00:00 via INTRAVENOUS

## 2017-11-21 MED ORDER — MEPERIDINE HCL 100 MG/ML IJ SOLN
INTRAMUSCULAR | Status: AC
  Filled 2017-11-21: qty 1

## 2017-11-21 MED ORDER — MINERAL OIL PO OIL
TOPICAL_OIL | ORAL | Status: AC
  Filled 2017-11-21: qty 30

## 2017-11-21 MED ORDER — LIDOCAINE VISCOUS 2 % MT SOLN
OROMUCOSAL | Status: AC
  Filled 2017-11-21: qty 15

## 2017-11-21 MED ORDER — MEPERIDINE HCL 100 MG/ML IJ SOLN
INTRAMUSCULAR | Status: DC | PRN
Start: 2017-11-21 — End: 2017-11-21
  Administered 2017-11-21 (×2): 25 mg

## 2017-11-21 MED ORDER — MIDAZOLAM HCL 5 MG/5ML IJ SOLN
INTRAMUSCULAR | Status: AC
  Filled 2017-11-21: qty 5

## 2017-11-21 NOTE — Discharge Instructions (Signed)
I dilated your esophagus. You have a stricture near the base of your esophagus. You have gastritis DUE TO ASPIRIN & ETODOLAC AND BENIGN APPEARING STOMACH POLYPS. I biopsied your stomach.   DRINK WATER TO KEEP YOUR URINE LIGHT YELLOW.  CONTINUE YOUR WEIGHT LOSS EFFORTS.  WHILE I DO NOT WANT TO ALARM YOU, YOUR BODY MASS INDEX IS OVER 30 WHICH MEANS YOU ARE OBESE. OBESITY IS ASSOCIATED WITH AN INCREASED RISK FOR ALL CANCERS, INCLUDING ESOPHAGEAL AND COLON CANCER.  SEE ENT FOR YOUR DRY MOUTH.  AVOID REFLUX TRIGGERS. SEE INFO BELOW.  FOLLOW A LOW FAT DIET. SEE INFO BELOW.  YOUR BIOPSY RESULTS WILL BE AVAILABLE IN MY CHART AFTER FEB 25 AND MY OFFICE WILL CONTACT YOU IN 10-14 DAYS WITH YOUR RESULTS.   FOLLOW UP IN 3 MOS. UPPER ENDOSCOPY AFTER CARE Read the instructions outlined below and refer to this sheet in the next week. These discharge instructions provide you with general information on caring for yourself after you leave the hospital. While your treatment has been planned according to the most current medical practices available, unavoidable complications occasionally occur. If you have any problems or questions after discharge, call DR. Cammy Sanjurjo, [phone].  ACTIVITY  You may resume your regular activity, but move at a slower pace for the next 24 hours.   Take frequent rest periods for the next 24 hours.   Walking will help get rid of the air and reduce the bloated feeling in your belly (abdomen).   No driving for 24 hours (because of the medicine (anesthesia) used during the test).   You may shower.   Do not sign any important legal documents or operate any machinery for 24 hours (because of the anesthesia used during the test).    NUTRITION  Drink plenty of fluids.   You may resume your normal diet as instructed by your doctor.   Begin with a light meal and progress to your normal diet. Heavy or fried foods are harder to digest and may make you feel sick to your stomach  (nauseated).   Avoid alcoholic beverages for 24 hours or as instructed.    MEDICATIONS  You may resume your normal medications.   WHAT YOU CAN EXPECT TODAY  Some feelings of bloating in the abdomen.   Passage of more gas than usual.    IF YOU HAD A BIOPSY TAKEN DURING THE UPPER ENDOSCOPY:    Eat a soft diet IF YOU HAVE NAUSEA, BLOATING, ABDOMINAL PAIN, OR VOMITING.    FINDING OUT THE RESULTS OF YOUR TEST Not all test results are available during your visit. DR. Oneida Alar WILL CALL YOU WITHIN 14 DAYS OF YOUR PROCEDUE WITH YOUR RESULTS. Do not assume everything is normal if you have not heard from DR. Sontee Desena, CALL HER OFFICE AT [phone].  SEEK IMMEDIATE MEDICAL ATTENTION AND CALL THE OFFICE: [phone] IF:  You have more than a spotting of blood in your stool.   Your belly is swollen (abdominal distention).   You are nauseated or vomiting.   You have a temperature over 101F.   You have abdominal pain or discomfort that is severe or gets worse throughout the day.  Gastritis  Gastritis is an inflammation (the body's way of reacting to injury and/or infection) of the stomach. It is often caused by viral or bacterial (germ) infections. It can also be caused BY ASPIRIN, BC/GOODY POWDER'S, (IBUPROFEN) MOTRIN, OR ALEVE (NAPROXEN), chemicals (including alcohol), SPICY FOODS, and medications. This illness may be associated with generalized malaise (  feeling tired, not well), UPPER ABDOMINAL STOMACH cramps, and fever. One common bacterial cause of gastritis is an organism known as H. Pylori. This can be treated with antibiotics.    ESOPHAGEAL STRICTURE  Esophageal strictures can be caused by stomach acid backing up into the tube that carries food from the mouth down to the stomach (lower esophagus).  TREATMENT There are a number of medicines used to treat reflux/stricture, including: Antacids.  Proton-pump inhibitors: PANTOPRAZOLE ZANTAC OR PEPCID    Lifestyle and home  remedies TO CONTROL ACID REFLUX You may eliminate or reduce the frequency of heartburn by making the following lifestyle changes:   Control your weight. Being overweight is a major risk factor for heartburn and GERD. Excess pounds put pressure on your abdomen, pushing up your stomach and causing acid to back up into your esophagus.    Eat smaller meals. 4 TO 6 MEALS A DAY. This reduces pressure on the lower esophageal sphincter, helping to prevent the valve from opening and acid from washing back into your esophagus.    Loosen your belt. Clothes that fit tightly around your waist put pressure on your abdomen and the lower esophageal sphincter.     Eliminate heartburn triggers. Everyone has specific triggers. Common triggers such as fatty or fried foods, spicy food, tomato sauce, carbonated beverages, alcohol, chocolate, mint, garlic, onion, caffeine and nicotine may make heartburn worse.    Avoid stooping or bending. Tying your shoes is OK. Bending over for longer periods to weed your garden isn't, especially soon after eating.    Don't lie down after a meal. Wait at least three to four hours after eating before going to bed, and don't lie down right after eating.   Alternative medicine  Several home remedies exist for treating GERD, but they provide only temporary relief. They include drinking baking soda (sodium bicarbonate) added to water or drinking other fluids such as baking soda mixed with cream of tartar and water.  Although these liquids create temporary relief by neutralizing, washing away or buffering acids, eventually they aggravate the situation by adding gas and fluid to your stomach, increasing pressure and causing more acid reflux. Further, adding more sodium to your diet may increase your blood pressure and add stress to your heart, and excessive bicarbonate ingestion can alter the acid-base balance in your body.    Marland Kitchen

## 2017-11-21 NOTE — Op Note (Signed)
[facility] Patient Name: [name] Procedure Date: 11/21/2017 11:22 AM MRN: [id number] Date of Birth: 1941-06-05 Attending MD: Barney Drain MD, MD CSN: [id number] Age: 77 Admit Type: Outpatient Procedure:                Upper GI endoscopy WITH COLD FORCEPS                            BIOPSY/ESOPHAGEAL DILATION Indications:              Dysphagia Providers:                Barney Drain MD, MD, Janeece Riggers, RN, Aram Candela Referring MD:             Glenda Chroman Medicines:                Meperidine 50 mg IV, Midazolam 4 mg IV Complications:            No immediate complications. Estimated Blood Loss:     Estimated blood loss was minimal. Procedure:                Pre-Anesthesia Assessment:                           - Prior to the procedure, a History and Physical                            was performed, and patient medications and                            allergies were reviewed. The patient's tolerance of                            previous anesthesia was also reviewed. The risks                            and benefits of the procedure and the sedation                            options and risks were discussed with the patient.                            All questions were answered, and informed consent                            was obtained. Prior Anticoagulants: The patient has                            taken aspirin, last dose was day of procedure. ASA                            Grade Assessment: II - A patient with mild systemic                            disease. After reviewing the risks and benefits,  the patient was deemed in satisfactory condition to                            undergo the procedure. After obtaining informed                            consent, the endoscope was passed under direct                            vision. Throughout the procedure, the patient's                            blood pressure, pulse, and oxygen  saturations were                            monitored continuously. The [id number] ([id number])                            scope was introduced through the mouth, and                            advanced to the second part of duodenum. The upper                            GI endoscopy was somewhat difficult due to the                            patient's agitation. The patient tolerated the                            procedure fairly well. Scope In: 11:50:42 AM Scope Out: 12:00:53 PM Total Procedure Duration: 0 hours 10 minutes 11 seconds  Findings:      A web was found in the distal esophagus. A guidewire was placed and the       scope was withdrawn. Dilation was performed with a Savary dilator with       moderate resistance at 14 mm, 15 mm and 16 mm. Estimated blood loss:       none.      Patchy mild inflammation characterized by congestion (edema) and       erythema was found in the gastric antrum. Biopsies were taken with a       cold forceps for Helicobacter pylori testing.      Multiple small sessile polyps with no bleeding and no stigmata of recent       bleeding were found in the cardia, in the gastric fundus and in the       gastric body. The polyp was removed with a cold biopsy forceps.       Resection and retrieval were complete.      The examined duodenum was normal. Impression:               - DYSPHAGIA DUE TO Web in the distal esophagus.                            Dilated.                           -  MILD Gastritis DUE TO NSAIDs                           - Multiple gastric polyps. Moderate Sedation:      Moderate (conscious) sedation was administered by the endoscopy nurse       and supervised by the endoscopist. The following parameters were       monitored: oxygen saturation, heart rate, blood pressure, and response       to care. Total physician intraservice time was 19 minutes. Recommendation:           - Await pathology results.                           - Continue  present medications.                           - Low fat diet.                           - Return to my office in 3 months.                           - Patient has a contact number available for                            emergencies. The signs and symptoms of potential                            delayed complications were discussed with the                            patient. Return to normal activities tomorrow.                            Written discharge instructions were provided to the                            patient. Procedure Code(s):        --- Professional ---                           [phone], Esophagogastroduodenoscopy, flexible,                            transoral; with insertion of guide wire followed by                            passage of dilator(s) through esophagus over guide                            wire                           43239, Esophagogastroduodenoscopy, flexible,                            transoral; with biopsy, single  or multiple                           99152, Moderate sedation services provided by the                            same physician or other qualified health care                            professional performing the diagnostic or                            therapeutic service that the sedation supports,                            requiring the presence of an independent trained                            observer to assist in the monitoring of the                            patient's level of consciousness and physiological                            status; initial 15 minutes of intraservice time,                            patient age 9 years or older Diagnosis Code(s):        --- Professional ---                           Q39.4, Esophageal web                           K29.70, Gastritis, unspecified, without bleeding                           K31.7, Polyp of stomach and duodenum                           R13.10, Dysphagia,  unspecified CPT copyright 2016 American Medical Association. All rights reserved. The codes documented in this report are preliminary and upon coder review may  be revised to meet current compliance requirements. Barney Drain, MD Barney Drain MD, MD 11/21/2017 12:13:42 PM This report has been signed electronically. Number of Addenda: 0

## 2017-11-21 NOTE — Interval H&P Note (Signed)
History and Physical Interval Note:  11/21/2017 11:35 AM  [name]  has presented today for surgery, with the diagnosis of regurgitation/dyspepsia  The various methods of treatment have been discussed with the patient and family. After consideration of risks, benefits and other options for treatment, the patient has consented to  Procedure(s) with comments: ESOPHAGOGASTRODUODENOSCOPY (EGD) (N/A) - 2:30pm SAVORY DILATION (N/A) as a surgical intervention .  The patient's history has been reviewed, patient examined, no change in status, stable for surgery.  I have reviewed the patient's chart and labs.  Questions were answered to the patient's satisfaction.     [employer]

## 2017-11-25 ENCOUNTER — Encounter (HOSPITAL_COMMUNITY): Payer: Self-pay | Admitting: Gastroenterology

## 2017-11-25 ENCOUNTER — Telehealth: Payer: Self-pay | Admitting: Gastroenterology

## 2017-11-25 NOTE — Telephone Encounter (Signed)
On recall for ov in 01/2018

## 2017-11-25 NOTE — Telephone Encounter (Signed)
  Please call pt. His stomach Bx shows mild gastritis DUE TO ASA AND ETODOLAC. HE HAD BENIGN POLYPS REMOVED FROM HIS STOMACH.    DRINK WATER TO KEEP YOUR URINE LIGHT YELLOW. CONTINUE YOUR WEIGHT LOSS EFFORTS. A WEIGHT OF 195 LBS WILL GET YOUR BODY MASS INDEX(BMI) UNDER 30.  SEE ENT FOR YOUR DRY MOUTH . AVOID REFLUX TRIGGERS.  FOLLOW A LOW FAT DIET.   FOLLOW UP IN MAY 2019 E30 DYSPHAGIA/GASTRITIS/GERD.

## 2017-11-25 NOTE — Telephone Encounter (Signed)
LMOM to call.

## 2017-11-26 NOTE — Telephone Encounter (Signed)
Pt's wife, Bertram Millard, returned call and was made aware of results and plan.

## 2017-12-24 ENCOUNTER — Encounter: Payer: Self-pay | Admitting: Gastroenterology

## 2017-12-25 ENCOUNTER — Telehealth: Payer: Self-pay | Admitting: General Practice

## 2017-12-25 NOTE — Telephone Encounter (Signed)
-----   Message from Theadora Rama sent at 12/25/2017  7:45 AM EDT ----- Regarding: billing question Pt's wife [facility] for me yesterday after I had left that the patient had been sent a bill, but the [facility] had referred him. The [facility] authorization is scanned into his chart and valid from 10/30/2017 to 04/29/2018.  Can you call the patient back at [phone] and help them with this?  Thanks

## 2017-12-25 NOTE — Telephone Encounter (Signed)
I called and spoke with Shanita with [facility] and emailed her a copy of the [place].  She is going to file the claim with the [place] and take the statement out of patient's responsibility.   I called and made the patient aware.

## 2018-05-01 ENCOUNTER — Telehealth: Payer: Self-pay

## 2018-05-01 NOTE — Telephone Encounter (Signed)
Patient has an appt with the ENT on 05/12/18. He would like to wait till he has his appt there before scheduling again.

## 2018-05-20 ENCOUNTER — Encounter: Payer: Self-pay | Admitting: Gastroenterology

## 2018-07-23 ENCOUNTER — Encounter: Payer: Self-pay | Admitting: Gastroenterology

## 2018-07-23 ENCOUNTER — Ambulatory Visit (INDEPENDENT_AMBULATORY_CARE_PROVIDER_SITE_OTHER): Payer: No Typology Code available for payment source | Admitting: Gastroenterology

## 2018-07-23 DIAGNOSIS — R1310 Dysphagia, unspecified: Secondary | ICD-10-CM

## 2018-07-23 DIAGNOSIS — K219 Gastro-esophageal reflux disease without esophagitis: Secondary | ICD-10-CM | POA: Diagnosis not present

## 2018-07-23 DIAGNOSIS — R1319 Other dysphagia: Secondary | ICD-10-CM

## 2018-07-23 DIAGNOSIS — D126 Benign neoplasm of colon, unspecified: Secondary | ICD-10-CM
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
## 2018-07-23 NOTE — Patient Instructions (Signed)
I WILL OBTAIN YOUR PRIOR COLONOSCOPY REPORT AND PATHOLOGY REPORTS FROMTHE LAST 5 YEARS. I WILL LET YOU KNOW IF AND WHEN YOU NEED ANOTHER TCS.  TO PREVENT CONSTIPATION:    1. DRINK WATER TO KEEP YOUR URINE LIGHT YELLOW.    2. FOLLOW A HIGH FIBER DIET. AVOID ITEMS THAT CAUSE BLOATING & GAS.    3. CONTINUE DOCUSATE 2 PILLS DAILY.  FOLLOW UP IN 1 YEAR.

## 2018-07-23 NOTE — Assessment & Plan Note (Addendum)
1 CM-REMOVED IN 2016 AT AGE 77. ACTUAL REPORTS NOT AVAILABLE TODAY.  We do not routinely screen for polyps after the age of 10  I WILL OBTAIN PRIOR COLONOSCOPY REPORT AND PATHOLOGY REPORTS FROM THE LAST 5 YEARS & WILL LET HIM KNOW IF AND WHEN HE NEEDS ANOTHER TCS.

## 2018-07-23 NOTE — Assessment & Plan Note (Signed)
SYMPTOMS FAIRLY WELL CONTROLLED.  CONTINUE PROTONIX. TAKE 30 MINUTES PRIOR TO MEALS TWICE DAILY. AVOID TRIGGERS. FOLLOW UP IN 1 YEAR. Marland Kitchen

## 2018-07-23 NOTE — Assessment & Plan Note (Signed)
SYMPTOMS CONTROLLED/RESOLVED.  CONTINUE TO MONITOR SYMPTOMS. 

## 2018-07-24 NOTE — Progress Notes (Signed)
CC'D TO PCP °

## 2018-07-24 NOTE — Progress Notes (Signed)
ON RECALL  °

## 2018-08-15 ENCOUNTER — Telehealth: Payer: Self-pay

## 2018-08-15 NOTE — Telephone Encounter (Signed)
S/w AV in our scheduling dept who will call the pt and schedule a New Pt appt as pt has not been seen since 2015, previous [place] pt. Since pt has not been seen since 2015 not sure if pt has seen another cardiologist elsewhere. If so per Cecilie Kicks, NP pt may be able to be cleared there with other cardiologist. If not pt needs a new pt appt in our office, possibly may need records as well. AV in scheduling will call pt.

## 2018-08-15 NOTE — Telephone Encounter (Signed)
   Primary Cardiologist:No primary care provider on file.  Chart reviewed as part of pre-operative protocol coverage. Because of [name]'s past medical history and time since last visit, he/she will require a follow-up visit in order to better assess preoperative cardiovascular risk.  Pre-op covering staff:  Check with patient he may have another cardiologist.  But if not he will need appt.  - Please schedule appointment and call patient to inform them. - Please contact requesting surgeon's office via preferred method (i.e, phone, fax) to inform them of need for appointment prior to surgery.  If applicable, this message will also be routed to pharmacy pool and/or primary cardiologist for input on holding anticoagulant/antiplatelet agent as requested below so that this information is available at time of patient's appointment.   Cecilie Kicks, NP  08/15/2018, 3:37 PM

## 2018-08-15 NOTE — Telephone Encounter (Signed)
    [facility] Pre-operative Risk Assessment    Request for surgical clearance:  1. What type of surgery is being performed? RIGHT CARPAL TUNNEL RELEASE, RIGHT CUBITAL TUNNEL RELEASE   2. When is this surgery scheduled? 09/01/2018   3. Are there any medications that need to be held prior to surgery and how long? ASA  4. Practice name and name of physician performing surgery? [facility]; DR Burney Gauze   5. What is your office phone and fax number? P# [phone] F# [phone]   6. Anesthesia type (None, local, MAC, general) ? GENERAL   [name] 08/15/2018, 2:32 PM  _________________________________________________________________   (provider comments below)

## 2018-08-15 NOTE — Telephone Encounter (Signed)
Pt has been scheduled to see Dr. Stanford Breed in the [facility] office as new pt for pre op clearance as well. Scheduler stats pt states he has not seen another cardiology office since 2015. I will remove from the call back pool.

## 2018-08-19 ENCOUNTER — Encounter: Payer: Self-pay | Admitting: Cardiology

## 2018-08-19 ENCOUNTER — Ambulatory Visit (INDEPENDENT_AMBULATORY_CARE_PROVIDER_SITE_OTHER): Payer: Medicare HMO | Admitting: Cardiology

## 2018-08-19 VITALS — BP 134/56 | HR 61 | Ht 68.0 in | Wt 216.0 lb

## 2018-08-19 DIAGNOSIS — I2510 Atherosclerotic heart disease of native coronary artery without angina pectoris: Secondary | ICD-10-CM | POA: Diagnosis not present

## 2018-08-19 DIAGNOSIS — I714 Abdominal aortic aneurysm, without rupture, unspecified: Secondary | ICD-10-CM

## 2018-08-19 DIAGNOSIS — I10 Essential (primary) hypertension: Secondary | ICD-10-CM

## 2018-08-19 DIAGNOSIS — R072 Precordial pain: Secondary | ICD-10-CM

## 2018-08-19 DIAGNOSIS — I712 Thoracic aortic aneurysm, without rupture, unspecified: Secondary | ICD-10-CM

## 2018-08-19 DIAGNOSIS — E7800 Pure hypercholesterolemia, unspecified: Secondary | ICD-10-CM

## 2018-08-19 LAB — BASIC METABOLIC PANEL WITH GFR
BUN/Creatinine Ratio: 19 (ref 10–24)
BUN: 17 mg/dL (ref 8–27)
CO2: 28 mmol/L (ref 20–29)
Calcium: 9.4 mg/dL (ref 8.6–10.2)
Chloride: 99 mmol/L (ref 96–106)
Creatinine, Ser: 0.9 mg/dL (ref 0.76–1.27)
GFR calc Af Amer: 94 mL/min/1.73
GFR calc non Af Amer: 81 mL/min/1.73
Glucose: 93 mg/dL (ref 65–99)
Potassium: 4.3 mmol/L (ref 3.5–5.2)
Sodium: 136 mmol/L (ref 134–144)
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
## 2018-08-19 NOTE — Patient Instructions (Signed)
Medication Instructions:  NO CHANGE If you need a refill on your cardiac medications before your next appointment, please call your pharmacy.   Lab work: Your physician recommends that you HAVE LAB WORK TODAY If you have labs (blood work) drawn today and your tests are completely normal, you will receive your results only by: Marland Kitchen MyChart Message (if you have MyChart) OR . A paper copy in the mail If you have any lab test that is abnormal or we need to change your treatment, we will call you to review the results.  Testing/Procedures: CTA OF THE CHEST AND ABDOMEN/PELVIS W/WO TO FOLLOW UP THORACIC ANEURYSM AND AAA  Your physician has requested that you have a lexiscan myoview. For further information please visit [email or web address]. Please follow instruction sheet, as given.    Follow-Up: At [facility], you and your health needs are our priority.  As part of our continuing mission to provide you with exceptional heart care, we have created designated Provider Care Teams.  These Care Teams include your primary Cardiologist (physician) and Advanced Practice Providers (APPs -  Physician Assistants and Nurse Practitioners) who all work together to provide you with the care you need, when you need it. You will need a follow up appointment in 12 months.  Please call our office 2 months in advance to schedule this appointment.  You may see Kirk Ruths MD or one of the following Advanced Practice Providers on your designated Care Team:   Kerin Ransom, PA-C Roby Lofts, [place] . Sande Rives, PA-C

## 2018-08-20 ENCOUNTER — Ambulatory Visit (INDEPENDENT_AMBULATORY_CARE_PROVIDER_SITE_OTHER)
Admission: RE | Admit: 2018-08-20 | Discharge: 2018-08-20 | Disposition: A | Discharge status: Home / Self Care | Payer: No Typology Code available for payment source | Source: Ambulatory Visit | Attending: Cardiology | Admitting: Cardiology

## 2018-08-20 ENCOUNTER — Telehealth (HOSPITAL_COMMUNITY): Payer: Self-pay

## 2018-08-20 DIAGNOSIS — I714 Abdominal aortic aneurysm, without rupture, unspecified: Secondary | ICD-10-CM

## 2018-08-20 DIAGNOSIS — I712 Thoracic aortic aneurysm, without rupture, unspecified: Secondary | ICD-10-CM

## 2018-08-20 MED ORDER — IOPAMIDOL (ISOVUE-370) INJECTION 76%
100.0000 mL | Freq: Once | INTRAVENOUS | Status: AC | PRN
  Administered 2018-08-20: 100 mL via INTRAVENOUS

## 2018-08-20 NOTE — Telephone Encounter (Signed)
Encounter complete. 

## 2018-08-22 ENCOUNTER — Ambulatory Visit (HOSPITAL_COMMUNITY)
Admission: RE | Admit: 2018-08-22 | Discharge: 2018-08-22 | Disposition: A | Discharge status: Home / Self Care | Payer: Medicare HMO | Source: Ambulatory Visit | Attending: Cardiology | Admitting: Cardiology

## 2018-08-22 DIAGNOSIS — R072 Precordial pain: Secondary | ICD-10-CM | POA: Diagnosis present

## 2018-08-22 LAB — MYOCARDIAL PERFUSION IMAGING
CHL CUP NUCLEAR SRS: 3
CHL CUP NUCLEAR SSS: 3
CSEPPHR: 69 {beats}/min
LV sys vol: 72 mL
LVDIAVOL: 158 mL (ref 62–150)
Rest HR: 50 {beats}/min
SDS: 0
TID: 1.03

## 2018-08-22 MED ORDER — REGADENOSON 0.4 MG/5ML IV SOLN
0.4000 mg | Freq: Once | INTRAVENOUS | Status: AC
  Administered 2018-08-22: 0.4 mg via INTRAVENOUS

## 2018-08-22 MED ORDER — TECHNETIUM TC 99M TETROFOSMIN IV KIT
11.0000 | PACK | Freq: Once | INTRAVENOUS | Status: AC | PRN
  Administered 2018-08-22: 11 via INTRAVENOUS
  Filled 2018-08-22: qty 11

## 2018-08-22 MED ORDER — TECHNETIUM TC 99M TETROFOSMIN IV KIT
31.8000 | PACK | Freq: Once | INTRAVENOUS | Status: AC | PRN
Start: 2018-08-22 — End: 2018-08-22
  Administered 2018-08-22: 31.8 via INTRAVENOUS
  Filled 2018-08-22: qty 32

## 2018-08-26 ENCOUNTER — Ambulatory Visit: Payer: Medicare HMO | Admitting: Cardiovascular Disease

## 2018-09-15 ENCOUNTER — Telehealth: Payer: Self-pay | Admitting: Gastroenterology

## 2018-09-15 NOTE — Telephone Encounter (Signed)
[name] is checking to see if we received any records.

## 2018-09-15 NOTE — Telephone Encounter (Signed)
Pt's wife called to say that patient was seen by [place] a few months ago and SF was going to let him know if he really needed a colonoscopy or not. Pt is wanting to have one done but if SF didn't want to do it he was going to let the [place] do the colonoscopy. She said they haven't heard anything and wanted to know what SF recommends. Please advise [phone] or [phone]

## 2018-09-16 ENCOUNTER — Telehealth: Payer: Self-pay | Admitting: Gastroenterology

## 2018-09-16 NOTE — Telephone Encounter (Signed)
PT's wife is aware we are waiting on the results and will let them know when we receive it and Dr. Oneida Alar reviews it. Pt's wife said that the pt is wanting to have a colonoscopy, said he cannot rest until he knows his colon has been checked good.

## 2018-09-16 NOTE — Telephone Encounter (Signed)
PATIENT WIFE CALLED AGAIN TODAY BECAUSE SHE HAS NOT HEARD FROM [place] ABOUT HER HUSBANDS TCS.  DR. FIELDS HAD A CONCERN ABOUT DOING HIS AND WANTED TO SPEAK TO THE [employer] ABOUT IT.  PATIENT WIFE STATED THAT WAS A WHILE AGO AND SHE IS CONCERNED ABOUT HIM NOT HAVING A COLONOSCOPY DUE TO PAST POLYPS.

## 2018-09-16 NOTE — Telephone Encounter (Signed)
See note from 09/15/2018.

## 2018-09-16 NOTE — Telephone Encounter (Signed)
REVIEWED-NO ADDITIONAL RECOMMENDATIONS. 

## 2018-09-16 NOTE — Telephone Encounter (Signed)
I had faxed the release to [facility] on [address]. I called the [facility] this morning and they are going to send last colonoscopy/path from 2016

## 2018-09-22 NOTE — Telephone Encounter (Signed)
Received the report and it is on Dr. Nona Dell chair for review and recommendations.

## 2018-09-23 NOTE — Telephone Encounter (Signed)
PT's wife is aware.

## 2018-09-23 NOTE — Telephone Encounter (Signed)
PLEASE CALL PT. HE HAD SIX POLYPS REMOVED AT AGE 77: ONE SIMPLE ADENOMA, AND FIVE HYPERPLASTIC POLYPS. We do not routinely screen for polyps after the age of 26. HE DOES NOT NEED ANOTHER COLONOSCOPY AT THIS TIME.

## 2019-01-06 ENCOUNTER — Other Ambulatory Visit: Payer: Self-pay | Admitting: Cardiology

## 2019-01-06 DIAGNOSIS — I2510 Atherosclerotic heart disease of native coronary artery without angina pectoris: Secondary | ICD-10-CM

## 2019-01-06 NOTE — Telephone Encounter (Signed)
Triage routed call.   I returned call to patient and his wife.  Bilateral swelling in feet and legs as well as increased abdominal girth. They called the PCP 3 weeks ago with similar complaints. The PCP increased lasix to 40 mg for 2 days and then skip a day and repeat. This regimen helped for a while, but he now has worsening lower extremity swelling, increased abdominal girth. His chronic SOB with exertion is stable. He wears CPAP at night. Both feet are warm and red. No fever. No recent illness and no sick contacts. The swelling decreases when he sleeps at night with his feet elevated.  I advised him to take 40 mg lasix in the morning and another 20 mg of lasix at lunch on Wed, Thurs, and Fri. I would like a BMP to be drawn on Friday at [facility].   Ms. Lamm expressed understanding of the plan. I will route this to Dr. Stanford Breed and to Dr. Martin[place] who is DOD at [facility] on Friday - I advised them to call back if the swelling was not improved.

## 2019-01-06 NOTE — Telephone Encounter (Signed)
Called patient, spoke with wife. She states that the patient has been having swelling in his legs - they contacted primary care doctor and they suggest to increase Lasix 20 mg, he has been taking 2 tablets one day, 2 tablets the next day, skip the following day, etc. It is bilateral swelling, that has turned his legs red, patient wife also states sister in law is a Therapist, sports and she came to look at his legs and stated he had pitting edema and they seen prints in the patients leg after touching.  Denies chest pain, SOB, weight gain, abdominal swelling, and no increase in diet or salt intake and only mentions slight hand/arm swelling at times. Patient had his BP checked while on the phone with me and it was 119/52 HR 68. No other changes to any other medications at this time.  Please advise.

## 2019-01-06 NOTE — Telephone Encounter (Signed)
Pt c/o swelling: STAT is pt has developed SOB within 24 hours  1) How much weight have you gained and in what time span? No weight gain  2) If swelling, where is the swelling located? Feet and legs  3) Are you currently taking a fluid pill? yes  4) Are you currently SOB? yes  5) Do you have a log of your daily weights (if so, list)? 210, patient has lost weight according to his wife.   6) Have you gained 3 pounds in a day or 5 pounds in a week? no  7) Have you traveled recently? No  Wife states he has swelling in his feet and legs and they are red.

## 2019-01-07 NOTE — Telephone Encounter (Signed)
° °  Spouse calling to request Lasix prescription be faxed to [facility] for refill [facility] ATTN:Cindy Fax [phone]

## 2019-01-07 NOTE — Addendum Note (Signed)
Addended by: Waylan Rocher on: 01/07/2019 11:16 AM   Modules accepted: Orders

## 2019-01-07 NOTE — Telephone Encounter (Signed)
  [name] gave the wrong fax number before. Please fax to [phone] [facility]

## 2019-01-07 NOTE — Telephone Encounter (Signed)
Unable to contact to verify pharmacy the phone rings and then hangs up will have to CB again at a later time. Which [facility]?

## 2019-01-09 ENCOUNTER — Telehealth: Payer: Self-pay

## 2019-01-09 ENCOUNTER — Other Ambulatory Visit: Payer: Self-pay

## 2019-01-09 DIAGNOSIS — I2510 Atherosclerotic heart disease of native coronary artery without angina pectoris: Secondary | ICD-10-CM

## 2019-01-09 LAB — BASIC METABOLIC PANEL
BUN/Creatinine Ratio: 16 (ref 10–24)
BUN: 17 mg/dL (ref 8–27)
CO2: 29 mmol/L (ref 20–29)
Calcium: 9.7 mg/dL (ref 8.6–10.2)
Chloride: 94 mmol/L — ABNORMAL LOW (ref 96–106)
Creatinine, Ser: 1.06 mg/dL (ref 0.76–1.27)
GFR calc Af Amer: 77 mL/min/{1.73_m2} (ref >59)
GFR calc non Af Amer: 66 mL/min/{1.73_m2} (ref >59)
Glucose: 102 mg/dL — ABNORMAL HIGH (ref 65–99)
Potassium: 3.2 mmol/L — ABNORMAL LOW (ref 3.5–5.2)
Sodium: 140 mmol/L (ref 134–144)

## 2019-01-09 NOTE — Telephone Encounter (Signed)
Patient walked in office.Stated he spoke to someone 2 days ago about swelling in both lower legs and feet.Stated he was told to increase lasix to 60 mg daily.He was told to come to office today he would be seeing DOD Dr.Jordan and having a bmet done.Advised he is not on Dr.Jordan's schedule and no documentation in chart about recent phone call.Patient stated swelling in lower legs and feet better since he increased lasix to 60 mg.No sob.No chest pain.Virtual visit scheduled with Dr.Crenshaw Tue 4/14 at 9:00 am.Bmet was done today.

## 2019-01-09 NOTE — Telephone Encounter (Signed)
Please refill lasix for what he is presently taking at home (can contact pt to verify) Kirk Ruths

## 2019-01-10 ENCOUNTER — Other Ambulatory Visit: Payer: Self-pay | Admitting: Physician Assistant

## 2019-01-10 DIAGNOSIS — I25118 Atherosclerotic heart disease of native coronary artery with other forms of angina pectoris: Secondary | ICD-10-CM

## 2019-01-10 MED ORDER — POTASSIUM CHLORIDE ER 20 MEQ PO TBCR: EXTENDED_RELEASE_TABLET | ORAL | 0 refills | Status: DC

## 2019-01-10 NOTE — Progress Notes (Signed)
Patient states he was taking 20 mg lasix previously. He had been on this dose for several years. He called his PCP at the [place] in [place] who increased his lasix to 60 mg to [place], Tues, and Friday. He was instructed to call his cardiologist. He has been taking 60 mg since at least Tues 01/06/19. He repeated a BMP on 01/09/19 which showed a K of 3.2. I sent a script for Kdur to his pharmacy (40 mEq x 2 days, then 20 mEq until follow up). The swelling in his legs and feet is resolved. He wants to stay on this dose of lasix until he talks to Dr. Stanford Breed next week.

## 2019-01-12 ENCOUNTER — Telehealth: Payer: Self-pay | Admitting: *Deleted

## 2019-01-12 ENCOUNTER — Telehealth: Payer: Self-pay | Admitting: Cardiology

## 2019-01-12 NOTE — Telephone Encounter (Addendum)
Left message for pt to call   ----- Message from Lelon Perla, MD sent at 01/12/2019  7:05 AM EDT ----- Recheck bmet 2 weeks Kirk Ruths

## 2019-01-12 NOTE — Telephone Encounter (Signed)
Home phone/ declined my chart/ pre reg completed

## 2019-01-12 NOTE — Telephone Encounter (Signed)
Home phone/declined my chart/ consented to virtual visit/ pre reg completed

## 2019-01-13 ENCOUNTER — Telehealth: Payer: Self-pay | Admitting: *Deleted

## 2019-01-13 ENCOUNTER — Encounter: Payer: Self-pay | Admitting: Cardiology

## 2019-01-13 ENCOUNTER — Telehealth (INDEPENDENT_AMBULATORY_CARE_PROVIDER_SITE_OTHER): Payer: Medicare HMO | Admitting: Cardiology

## 2019-01-13 VITALS — BP 129/56 | HR 55 | Ht 68.0 in | Wt 208.0 lb

## 2019-01-13 DIAGNOSIS — I77810 Thoracic aortic ectasia: Secondary | ICD-10-CM | POA: Diagnosis not present

## 2019-01-13 DIAGNOSIS — I2510 Atherosclerotic heart disease of native coronary artery without angina pectoris: Secondary | ICD-10-CM | POA: Diagnosis not present

## 2019-01-13 DIAGNOSIS — E7800 Pure hypercholesterolemia, unspecified: Secondary | ICD-10-CM | POA: Diagnosis not present

## 2019-01-13 DIAGNOSIS — I10 Essential (primary) hypertension: Secondary | ICD-10-CM | POA: Diagnosis not present

## 2019-01-13 DIAGNOSIS — I25118 Atherosclerotic heart disease of native coronary artery with other forms of angina pectoris: Secondary | ICD-10-CM

## 2019-01-13 MED ORDER — POTASSIUM CHLORIDE ER 20 MEQ PO TBCR: 20.0000 meq | EXTENDED_RELEASE_TABLET | Freq: Every day | ORAL | 3 refills | Status: DC

## 2019-01-13 MED ORDER — FUROSEMIDE 20 MG PO TABS: ORAL_TABLET | ORAL | Status: DC

## 2019-01-13 NOTE — Telephone Encounter (Signed)
Spoke with pt wife, Aware of dr crenshaw's recommendations.  New script sent to the pharmacy  

## 2019-01-13 NOTE — Telephone Encounter (Signed)
Spoke with pt wife to follow up with e visit this morning, she reports the patient was only given 7 potassium pills and he only has 1 left. Does he continue to take or wait until labs are completed in one week. Will forward for dr Stanford Breed review
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
## 2019-01-13 NOTE — Patient Instructions (Signed)
Medication Instructions:  TAKE 40 MG OF FUROSEMIDE ONCE DAILY AND THEN TAKE ANOTHER 20 MG IN THE AFTERNOON AS NEEDED FOR SWELLING= 2 TABLETS IN THE MORNING ONCE DAILY AND 1 TABLET IN THE AFTERNOON AS NEEDED FOR SWELLING If you need a refill on your cardiac medications before your next appointment, please call your pharmacy.   Lab work: Your physician recommends that you return for lab work in: [place] If you have labs (blood work) drawn today and your tests are completely normal, you will receive your results only by: Marland Kitchen MyChart Message (if you have MyChart) OR . A paper copy in the mail If you have any lab test that is abnormal or we need to change your treatment, we will call you to review the results.  Follow-Up: At [facility], you and your health needs are our priority.  As part of our continuing mission to provide you with exceptional heart care, we have created designated Provider Care Teams.  These Care Teams include your primary Cardiologist (physician) and Advanced Practice Providers (APPs -  Physician Assistants and Nurse Practitioners) who all work together to provide you with the care you need, when you need it. You will need a follow up appointment in 6 months.  Please call our office 2 months in advance to schedule this appointment.  You may see Kirk Ruths MD or one of the following Advanced Practice Providers on your designated Care Team:   Kerin Ransom, PA-C [place], [place] . Sande Rives, PA-C

## 2019-01-13 NOTE — Telephone Encounter (Signed)
Continue [name]

## 2019-01-20 ENCOUNTER — Telehealth: Payer: Self-pay | Admitting: *Deleted

## 2019-01-20 ENCOUNTER — Other Ambulatory Visit (HOSPITAL_COMMUNITY)
Admission: RE | Admit: 2019-01-20 | Discharge: 2019-01-20 | Disposition: A | Discharge status: Home / Self Care | Payer: Medicare HMO | Source: Ambulatory Visit | Attending: Cardiology | Admitting: Cardiology

## 2019-01-20 ENCOUNTER — Other Ambulatory Visit: Payer: Self-pay

## 2019-01-20 DIAGNOSIS — I25118 Atherosclerotic heart disease of native coronary artery with other forms of angina pectoris: Secondary | ICD-10-CM

## 2019-01-20 DIAGNOSIS — I10 Essential (primary) hypertension: Secondary | ICD-10-CM | POA: Insufficient documentation

## 2019-01-20 DIAGNOSIS — E876 Hypokalemia: Secondary | ICD-10-CM

## 2019-01-20 DIAGNOSIS — E7800 Pure hypercholesterolemia, unspecified: Secondary | ICD-10-CM | POA: Insufficient documentation

## 2019-01-20 LAB — LIPID PANEL
Cholesterol: 156 mg/dL (ref 0–200)
HDL: 45 mg/dL (ref >40)
LDL Cholesterol: 85 mg/dL (ref 0–99)
Total CHOL/HDL Ratio: 3.5 RATIO
Triglycerides: 131 mg/dL (ref <150)
VLDL: 26 mg/dL (ref 0–40)

## 2019-01-20 LAB — COMPREHENSIVE METABOLIC PANEL
ALT: 15 U/L (ref 0–44)
AST: 18 U/L (ref 15–41)
Albumin: 4.2 g/dL (ref 3.5–5.0)
Alkaline Phosphatase: 100 U/L (ref 38–126)
Anion gap: 10 (ref 5–15)
BUN: 17 mg/dL (ref 8–23)
CO2: 30 mmol/L (ref 22–32)
Calcium: 9.5 mg/dL (ref 8.9–10.3)
Chloride: 98 mmol/L (ref 98–111)
Creatinine, Ser: 0.99 mg/dL (ref 0.61–1.24)
GFR calc Af Amer: >60 mL/min (ref >60)
GFR calc non Af Amer: >60 mL/min (ref >60)
Glucose, Bld: 114 mg/dL — ABNORMAL HIGH (ref 70–99)
Potassium: 3.3 mmol/L — ABNORMAL LOW (ref 3.5–5.1)
Sodium: 138 mmol/L (ref 135–145)
Total Bilirubin: 0.9 mg/dL (ref 0.3–1.2)
Total Protein: 7.3 g/dL (ref 6.5–8.1)

## 2019-01-20 MED ORDER — POTASSIUM CHLORIDE ER 20 MEQ PO TBCR: 20.0000 meq | EXTENDED_RELEASE_TABLET | Freq: Two times a day (BID) | ORAL | 3 refills | Status: AC

## 2019-01-20 NOTE — Telephone Encounter (Signed)
Spoke with pt wife, aware to increase potassium. New script sent to the pharmacy and Lab orders mailed to the pt

## 2019-01-20 NOTE — Telephone Encounter (Signed)
-----   Message from Lelon Perla, MD sent at 01/20/2019 11:25 AM EDT ----- If taking kdur 20 meq daily increase to 40 meq daily; bmet 2 weeks Kirk Ruths

## 2019-02-03 ENCOUNTER — Other Ambulatory Visit: Payer: Self-pay

## 2019-02-03 ENCOUNTER — Other Ambulatory Visit (HOSPITAL_COMMUNITY)
Admission: RE | Admit: 2019-02-03 | Discharge: 2019-02-03 | Disposition: A | Discharge status: Home / Self Care | Payer: Medicare HMO | Source: Ambulatory Visit | Attending: Cardiology | Admitting: Cardiology

## 2019-02-03 DIAGNOSIS — E876 Hypokalemia: Secondary | ICD-10-CM | POA: Diagnosis present

## 2019-02-03 LAB — BASIC METABOLIC PANEL
Anion gap: 9 (ref 5–15)
BUN: 16 mg/dL (ref 8–23)
CO2: 28 mmol/L (ref 22–32)
Calcium: 9.4 mg/dL (ref 8.9–10.3)
Chloride: 100 mmol/L (ref 98–111)
Creatinine, Ser: 1.04 mg/dL (ref 0.61–1.24)
GFR calc Af Amer: >60 mL/min (ref >60)
GFR calc non Af Amer: >60 mL/min (ref >60)
Glucose, Bld: 108 mg/dL — ABNORMAL HIGH (ref 70–99)
Potassium: 3.6 mmol/L (ref 3.5–5.1)
Sodium: 137 mmol/L (ref 135–145)

## 2019-05-28 ENCOUNTER — Other Ambulatory Visit: Payer: Self-pay

## 2019-05-28 DIAGNOSIS — Z20822 Contact with and (suspected) exposure to covid-19: Secondary | ICD-10-CM

## 2019-05-29 LAB — NOVEL CORONAVIRUS, NAA: SARS-CoV-2, NAA: NOT DETECTED

## 2019-06-01 ENCOUNTER — Telehealth: Payer: Self-pay | Admitting: Internal Medicine

## 2019-06-01 NOTE — Telephone Encounter (Signed)
Negative COVID results given. Patient results "NOT Detected." Caller expressed understanding.   Pt's wife Ammanuel Friebel given pt's results.

## 2019-06-30 ENCOUNTER — Encounter: Payer: Self-pay | Admitting: Gastroenterology
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
## 2019-09-10 ENCOUNTER — Encounter: Payer: Self-pay | Admitting: Cardiology

## 2019-09-10 ENCOUNTER — Other Ambulatory Visit: Payer: Self-pay

## 2019-09-10 ENCOUNTER — Ambulatory Visit (INDEPENDENT_AMBULATORY_CARE_PROVIDER_SITE_OTHER): Payer: Medicare HMO | Admitting: Cardiology

## 2019-09-10 VITALS — BP 132/60 | HR 58 | Temp 97.7°F | Ht 68.0 in | Wt 221.0 lb

## 2019-09-10 DIAGNOSIS — I714 Abdominal aortic aneurysm, without rupture, unspecified: Secondary | ICD-10-CM

## 2019-09-10 DIAGNOSIS — E7800 Pure hypercholesterolemia, unspecified: Secondary | ICD-10-CM

## 2019-09-10 DIAGNOSIS — I10 Essential (primary) hypertension: Secondary | ICD-10-CM

## 2019-09-10 DIAGNOSIS — I77810 Thoracic aortic ectasia: Secondary | ICD-10-CM

## 2019-09-10 DIAGNOSIS — I712 Thoracic aortic aneurysm, without rupture, unspecified: Secondary | ICD-10-CM

## 2019-09-10 DIAGNOSIS — I25118 Atherosclerotic heart disease of native coronary artery with other forms of angina pectoris: Secondary | ICD-10-CM

## 2019-09-10 LAB — COMPREHENSIVE METABOLIC PANEL
ALT: 11 IU/L (ref 0–44)
AST: 16 IU/L (ref 0–40)
Albumin/Globulin Ratio: 2.2 (ref 1.2–2.2)
Albumin: 4.2 g/dL (ref 3.7–4.7)
Alkaline Phosphatase: 107 IU/L (ref 39–117)
BUN/Creatinine Ratio: 17 (ref 10–24)
BUN: 13 mg/dL (ref 8–27)
Bilirubin Total: 0.8 mg/dL (ref 0.0–1.2)
CO2: 23 mmol/L (ref 20–29)
Calcium: 9.7 mg/dL (ref 8.6–10.2)
Chloride: 101 mmol/L (ref 96–106)
Creatinine, Ser: 0.75 mg/dL — ABNORMAL LOW (ref 0.76–1.27)
GFR calc Af Amer: 100 mL/min/{1.73_m2} (ref >59)
GFR calc non Af Amer: 87 mL/min/{1.73_m2} (ref >59)
Globulin, Total: 1.9 g/dL (ref 1.5–4.5)
Glucose: 100 mg/dL — ABNORMAL HIGH (ref 65–99)
Potassium: 3.9 mmol/L (ref 3.5–5.2)
Sodium: 139 mmol/L (ref 134–144)
Total Protein: 6.1 g/dL (ref 6.0–8.5)

## 2019-09-10 LAB — LIPID PANEL
Chol/HDL Ratio: 2.9 ratio (ref 0.0–5.0)
Cholesterol, Total: 130 mg/dL (ref 100–199)
HDL: 45 mg/dL (ref >39)
LDL Chol Calc (NIH): 64 mg/dL (ref 0–99)
Triglycerides: 117 mg/dL (ref 0–149)
VLDL Cholesterol Cal: 21 mg/dL (ref 5–40)

## 2019-09-10 NOTE — Patient Instructions (Signed)
Medication Instructions:  NO CHANGE *If you need a refill on your cardiac medications before your next appointment, please call your pharmacy*  Lab Work: Your physician recommends that you HAVE LAB WORK TODAY  If you have labs (blood work) drawn today and your tests are completely normal, you will receive your results only by: Marland Kitchen MyChart Message (if you have MyChart) OR . A paper copy in the mail If you have any lab test that is abnormal or we need to change your treatment, we will call you to review the results.  Testing/Procedures: CTA OF THE CHEST TO FOLLOW UP THORACIC ANEURYSM AT [address] [facility]  Follow-Up: At [facility], you and your health needs are our priority.  As part of our continuing mission to provide you with exceptional heart care, we have created designated Provider Care Teams.  These Care Teams include your primary Cardiologist (physician) and Advanced Practice Providers (APPs -  Physician Assistants and Nurse Practitioners) who all work together to provide you with the care you need, when you need it.  Your next appointment:   12 month(s)  The format for your next appointment:   Either In Person or Virtual  Provider:   You may see Kirk Ruths MD or one of the following Advanced Practice Providers on your designated Care Team:    Kerin Ransom, PA-C  [place], [place]  Coletta Memos, [place]

## 2019-09-11 ENCOUNTER — Encounter: Payer: Self-pay | Admitting: *Deleted

## 2019-09-21 ENCOUNTER — Other Ambulatory Visit: Payer: Medicare HMO

## 2019-09-28 ENCOUNTER — Ambulatory Visit
Admission: RE | Admit: 2019-09-28 | Discharge: 2019-09-28 | Disposition: A | Discharge status: Home / Self Care | Payer: Medicare HMO | Source: Ambulatory Visit | Attending: Cardiology | Admitting: Cardiology

## 2019-09-28 DIAGNOSIS — I712 Thoracic aortic aneurysm, without rupture, unspecified: Secondary | ICD-10-CM

## 2019-09-28 MED ORDER — IOPAMIDOL (ISOVUE-370) INJECTION 76%
75.0000 mL | Freq: Once | INTRAVENOUS | Status: AC | PRN
  Administered 2019-09-28: 75 mL via INTRAVENOUS

## 2019-09-29 ENCOUNTER — Telehealth: Payer: Self-pay

## 2019-09-29 NOTE — Telephone Encounter (Signed)
Left message for patient to call back for results of CT Angio Chest.

## 2019-09-29 NOTE — Telephone Encounter (Signed)
-----   Message from Lelon Perla, MD sent at 09/29/2019 10:36 AM EST ----- Aortic root unchanged; repeat study one year Kirk Ruths

## 2019-09-30 NOTE — Telephone Encounter (Signed)
Results reviewed, verbalized understanding.

## 2019-11-06 ENCOUNTER — Emergency Department (HOSPITAL_COMMUNITY): Payer: No Typology Code available for payment source

## 2019-11-06 ENCOUNTER — Encounter (HOSPITAL_COMMUNITY): Payer: Self-pay | Admitting: Emergency Medicine

## 2019-11-06 ENCOUNTER — Other Ambulatory Visit: Payer: Self-pay

## 2019-11-06 ENCOUNTER — Emergency Department (HOSPITAL_COMMUNITY)
Admission: EM | Admit: 2019-11-06 | Discharge: 2019-11-06 | Disposition: A | Discharge status: Home / Self Care | Payer: No Typology Code available for payment source | Attending: Emergency Medicine | Admitting: Emergency Medicine

## 2019-11-06 ENCOUNTER — Telehealth: Payer: Self-pay

## 2019-11-06 DIAGNOSIS — Z8521 Personal history of malignant neoplasm of larynx: Secondary | ICD-10-CM | POA: Diagnosis not present

## 2019-11-06 DIAGNOSIS — R778 Other specified abnormalities of plasma proteins: Secondary | ICD-10-CM | POA: Diagnosis not present

## 2019-11-06 DIAGNOSIS — I2510 Atherosclerotic heart disease of native coronary artery without angina pectoris: Secondary | ICD-10-CM | POA: Diagnosis not present

## 2019-11-06 DIAGNOSIS — Z7982 Long term (current) use of aspirin: Secondary | ICD-10-CM | POA: Diagnosis not present

## 2019-11-06 DIAGNOSIS — M542 Cervicalgia: Secondary | ICD-10-CM | POA: Diagnosis present

## 2019-11-06 DIAGNOSIS — J449 Chronic obstructive pulmonary disease, unspecified: Secondary | ICD-10-CM | POA: Insufficient documentation

## 2019-11-06 DIAGNOSIS — I10 Essential (primary) hypertension: Secondary | ICD-10-CM | POA: Insufficient documentation

## 2019-11-06 DIAGNOSIS — Z87891 Personal history of nicotine dependence: Secondary | ICD-10-CM | POA: Insufficient documentation

## 2019-11-06 LAB — BASIC METABOLIC PANEL
Anion gap: 9 (ref 5–15)
BUN: 10 mg/dL (ref 8–23)
CO2: 27 mmol/L (ref 22–32)
Calcium: 9.4 mg/dL (ref 8.9–10.3)
Chloride: 103 mmol/L (ref 98–111)
Creatinine, Ser: 1.2 mg/dL (ref 0.61–1.24)
GFR calc Af Amer: >60 mL/min (ref >60)
GFR calc non Af Amer: 57 mL/min — ABNORMAL LOW (ref >60)
Glucose, Bld: 102 mg/dL — ABNORMAL HIGH (ref 70–99)
Potassium: 3.6 mmol/L (ref 3.5–5.1)
Sodium: 139 mmol/L (ref 135–145)

## 2019-11-06 LAB — CBC
HCT: 42.3 % (ref 39.0–52.0)
Hemoglobin: 14.2 g/dL (ref 13.0–17.0)
MCH: 30.5 pg (ref 26.0–34.0)
MCHC: 33.6 g/dL (ref 30.0–36.0)
MCV: 91 fL (ref 80.0–100.0)
Platelets: 256 10*3/uL (ref 150–400)
RBC: 4.65 MIL/uL (ref 4.22–5.81)
RDW: 13.7 % (ref 11.5–15.5)
WBC: 5.5 10*3/uL (ref 4.0–10.5)
nRBC: 0 % (ref 0.0–0.2)

## 2019-11-06 LAB — TROPONIN I (HIGH SENSITIVITY)
Troponin I (High Sensitivity): 137 ng/L (ref <18)
Troponin I (High Sensitivity): 126 ng/L (ref <18)

## 2019-11-06 MED ORDER — SODIUM CHLORIDE 0.9% FLUSH
3.0000 mL | Freq: Once | INTRAVENOUS | Status: AC
  Administered 2019-11-06: 3 mL via INTRAVENOUS

## 2019-11-06 MED ORDER — IOHEXOL 300 MG/ML  SOLN
75.0000 mL | Freq: Once | INTRAMUSCULAR | Status: AC | PRN
  Administered 2019-11-06: 20:00:00 75 mL via INTRAVENOUS

## 2019-11-06 NOTE — ED Notes (Signed)
[place], with the pts wife they are in the parking lot no update has been given

## 2019-11-06 NOTE — ED Triage Notes (Signed)
Pt c/o intermittent chest pain, shortness of breath, throat pain and left shoulder pain. Pt reports hx of throat cancer and cardac stent placement x 2. Symptoms started one week ago.

## 2019-11-06 NOTE — ED Notes (Signed)
Patient verbalizes understanding of discharge instructions. Opportunity for questioning and answers were provided. Armband removed by staff, pt discharged from ED to home via POV  

## 2019-11-06 NOTE — ED Notes (Signed)
[id number] Imre Guerrieri wife looking for an update, stated no update has been given

## 2019-11-06 NOTE — Discharge Instructions (Signed)
The pain in her neck appears to be from the muscles.  This does not appear to be her heart although the heart enzymes are mildly elevated.  They have been stable over the time here.  Follow-up with Dr. Stanford Breed .  He should be being notified that you will be needing follow-up.  Return to the ER if he began to have exertional pain.
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
## 2019-11-06 NOTE — ED Notes (Signed)
Ruby Buser the wife would like an update when you have information  [phone]

## 2019-11-06 NOTE — Telephone Encounter (Signed)
Incoming call from pt wife. She states pt has been having pain to left side from neck radiating to left arm, CP, SOB, lightheadedness 'for one day'. She states he has Hx throat cancer so she did not know if neck pain was from this, but he does have Hx of stent placement. Advised her that pt should report to ED d/t symptoms. She is apprehensive about going to ED d/t possible exposure to COVID-19. Informed pt wife that nurse can call ahead to let ED charge nurse know pt will be presenting to ED so they are already aware of his symptoms. Pt wife agreeable and will drive pt to [facility] ED.  Contacted ED charge nurse at [facility] and informed that pt will be presenting d/t the above-mentioned symptoms. Provided pt name and DOB.  Pt wife returning call to triage nurse. She states pt needs to complete registration prior to being seen and she plans on waiting in her car with pt until she can be seen. Pt handed phone to registration personnel at [facility]. Spoke with personnel who states pt wife under the impression that pt does not need to be pre-registered prior to being seen. She states she has informed pt wife that if pt leaves ED his place in queue can not be held. Requested that she stress this to pt wife and encourage her to have pt wait in ED to be seen. She verbalized understanding
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
## 2019-11-10 ENCOUNTER — Ambulatory Visit: Payer: No Typology Code available for payment source | Admitting: General Practice
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
## 2019-11-11 ENCOUNTER — Ambulatory Visit (INDEPENDENT_AMBULATORY_CARE_PROVIDER_SITE_OTHER): Payer: Medicare Other | Admitting: General Practice

## 2019-11-11 ENCOUNTER — Encounter: Payer: Self-pay | Admitting: General Practice

## 2019-11-11 ENCOUNTER — Other Ambulatory Visit: Payer: Self-pay

## 2019-11-11 ENCOUNTER — Encounter (INDEPENDENT_AMBULATORY_CARE_PROVIDER_SITE_OTHER): Payer: Self-pay

## 2019-11-11 VITALS — BP 146/60 | HR 71 | Ht 68.0 in | Wt 223.0 lb

## 2019-11-11 DIAGNOSIS — I714 Abdominal aortic aneurysm, without rupture, unspecified: Secondary | ICD-10-CM

## 2019-11-11 DIAGNOSIS — S46812A Strain of other muscles, fascia and tendons at shoulder and upper arm level, left arm, initial encounter: Secondary | ICD-10-CM | POA: Diagnosis not present

## 2019-11-11 DIAGNOSIS — I5032 Chronic diastolic (congestive) heart failure: Secondary | ICD-10-CM

## 2019-11-11 DIAGNOSIS — I10 Essential (primary) hypertension: Secondary | ICD-10-CM | POA: Diagnosis not present

## 2019-11-11 DIAGNOSIS — E7800 Pure hypercholesterolemia, unspecified: Secondary | ICD-10-CM

## 2019-11-11 DIAGNOSIS — I712 Thoracic aortic aneurysm, without rupture, unspecified: Secondary | ICD-10-CM

## 2019-11-11 DIAGNOSIS — I25118 Atherosclerotic heart disease of native coronary artery with other forms of angina pectoris: Secondary | ICD-10-CM | POA: Diagnosis not present

## 2019-11-11 NOTE — Patient Instructions (Signed)
Special Instructions: PLEASE REST AND ALTERNATE APPLYING HEAT AND ICE FOR 20 MINUTES THREE TIMES A DAY  PLEASE PURCHASE AND WEAR COMPRESSION STOCKINGS DAILY AND OFF AT BEDTIME. Compression stockings are elastic socks that squeeze the legs. They help to increase blood flow to the legs and to decrease swelling in the legs from fluid retention, and reduce the chance of developing blood clots in the lower legs.   PLEASE READ AND FOLLOW SALTY 6 ATTACHED  Reduce your risk of getting COVID-19 With your heart disease it is especially important for people at increased risk of severe illness from COVID-19, and those who live with them, to protect themselves from getting COVID-19. The best way to protect yourself and to help reduce the spread of the virus that causes COVID-19 is to: Marland Kitchen Limit your interactions with other people as much as possible. . Take precautions to prevent getting COVID-19 when you do interact with others. If you start feeling sick and think you may have COVID-19, get in touch with your healthcare provider within 24 hours.  Follow-Up: 12 months Please call our office 2 months in advance, [place] to schedule this FEB 2021 appointment. In Person You may see Kirk Ruths, MD or one of the following Advanced Practice Providers on your designated Care Team: Coletta Memos, [place], PA-C  [place], [place].    At [facility], you and your health needs are our priority.  As part of our continuing mission to provide you with exceptional heart care, we have created designated Provider Care Teams.  These Care Teams include your primary Cardiologist (physician) and Advanced Practice Providers (APPs -  Physician Assistants and Nurse Practitioners) who all work together to provide you with the care you need, when you need it.  Thank you for choosing [facility] at [facility]!!

## 2020-01-05 DIAGNOSIS — M25511 Pain in right shoulder: Secondary | ICD-10-CM | POA: Diagnosis not present

## 2020-04-20 ENCOUNTER — Telehealth: Payer: Self-pay | Admitting: Cardiology

## 2020-04-20 NOTE — Telephone Encounter (Signed)
Patients wife, Bertram Millard, states patient is cold all the time, no matter the temp in the house. Ruby stated that they are concerned the patients blood is not circulating. Both of the patients hands and arms numbness, Ruby states this has been going on for about 3 weeks. The numbness comes and goes. He has headaches pretty frequently, and sob.    Pt c/o Shortness Of Breath: STAT if SOB developed within the last 24 hours or pt is noticeably SOB on the phone  1. Are you currently SOB (can you hear that pt is SOB on the phone)? No   2. How long have you been experiencing SOB? 4-5 months   3. Are you SOB when sitting or when up moving around? DOE   4. Are you currently experiencing any other symptoms? No    Made patient an appt on 10/18 @ 10:20am, wife is hoping for something sooner.

## 2020-04-20 NOTE — Telephone Encounter (Signed)
Called back- number was out of service, unable to reach anyone.  Will try again.

## 2020-04-21 NOTE — Telephone Encounter (Signed)
Patient's wife calling back with the patient. 

## 2020-04-21 NOTE — Telephone Encounter (Signed)
Spoke with pt wife, she reports he is cold all time. She reports SOB when the patient walks to the car. This has been going on for about 2 weeks now. He has swelling in his feet and legs. His compression hose does help some. His weight is the same. He has CPAP but she reports he gets up during the night and reports he is smothering. The patient is currently not at home and she is unsure how he is taking the furosemide. Was able to schedule appointment to see dr Stanford Breed Monday 04-25-20. Ask the patient to call once he gets home regarding his medications.

## 2020-04-21 NOTE — Telephone Encounter (Signed)
Pt calling back to make MD and nurse aware that he is taking Lasix 40 mg daily and additional 1/2 tablet for swelling.

## 2020-04-22 NOTE — Telephone Encounter (Signed)
Spoke with pt, he reports he thinks he will be fine with the current dosage of the furosemide until follow up appointment.

## 2020-04-25 ENCOUNTER — Ambulatory Visit (INDEPENDENT_AMBULATORY_CARE_PROVIDER_SITE_OTHER): Payer: Medicare Other | Admitting: Cardiology

## 2020-04-25 ENCOUNTER — Encounter: Payer: Self-pay | Admitting: *Deleted

## 2020-04-25 ENCOUNTER — Encounter: Payer: Self-pay | Admitting: Cardiology

## 2020-04-25 ENCOUNTER — Other Ambulatory Visit: Payer: Self-pay

## 2020-04-25 VITALS — BP 122/58 | HR 59 | Ht 68.0 in | Wt 214.2 lb

## 2020-04-25 DIAGNOSIS — E7800 Pure hypercholesterolemia, unspecified: Secondary | ICD-10-CM

## 2020-04-25 DIAGNOSIS — I5032 Chronic diastolic (congestive) heart failure: Secondary | ICD-10-CM | POA: Diagnosis not present

## 2020-04-25 DIAGNOSIS — R0602 Shortness of breath: Secondary | ICD-10-CM

## 2020-04-25 NOTE — Patient Instructions (Signed)
Medication Instructions:  NO CHANGE *If you need a refill on your cardiac medications before your next appointment, please call your pharmacy*   Lab Work: Your physician recommends that you HAVE LAB WORK TODAY  If you have labs (blood work) drawn today and your tests are completely normal, you will receive your results only by: Marland Kitchen MyChart Message (if you have MyChart) OR . A paper copy in the mail If you have any lab test that is abnormal or we need to change your treatment, we will call you to review the results.   Testing/Procedures:  Your physician has requested that you have a lexiscan myoview. For further information please visit [email or web address]. Please follow instruction sheet, as given.[place]   Follow-Up: At [facility], you and your health needs are our priority.  As part of our continuing mission to provide you with exceptional heart care, we have created designated Provider Care Teams.  These Care Teams include your primary Cardiologist (physician) and Advanced Practice Providers (APPs -  Physician Assistants and Nurse Practitioners) who all work together to provide you with the care you need, when you need it.  We recommend signing up for the patient portal called "MyChart".  Sign up information is provided on this After Visit Summary.  MyChart is used to connect with patients for Virtual Visits (Telemedicine).  Patients are able to view lab/test results, encounter notes, upcoming appointments, etc.  Non-urgent messages can be sent to your provider as well.   To learn more about what you can do with MyChart, go to [email or web address].    Your next appointment:   12 month(s)  The format for your next appointment:   In Person  Provider:   You may see Kirk Ruths, MD or one of the following Advanced Practice Providers on your designated Care Team:    Kerin Ransom, PA-C  [place], [place]  Coletta Memos, [place]
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
## 2020-04-26 ENCOUNTER — Telehealth: Payer: Self-pay | Admitting: Cardiology

## 2020-04-26 ENCOUNTER — Telehealth (HOSPITAL_COMMUNITY): Payer: Self-pay | Admitting: *Deleted

## 2020-04-26 LAB — COMPREHENSIVE METABOLIC PANEL
ALT: 10 IU/L (ref 0–44)
AST: 14 IU/L (ref 0–40)
Albumin/Globulin Ratio: 1.9 (ref 1.2–2.2)
Albumin: 4.1 g/dL (ref 3.7–4.7)
Alkaline Phosphatase: 97 IU/L (ref 48–121)
BUN/Creatinine Ratio: 18 (ref 10–24)
BUN: 16 mg/dL (ref 8–27)
Bilirubin Total: 0.6 mg/dL (ref 0.0–1.2)
CO2: 27 mmol/L (ref 20–29)
Calcium: 9.4 mg/dL (ref 8.6–10.2)
Chloride: 97 mmol/L (ref 96–106)
Creatinine, Ser: 0.87 mg/dL (ref 0.76–1.27)
GFR calc Af Amer: 94 mL/min/{1.73_m2} (ref >59)
GFR calc non Af Amer: 82 mL/min/{1.73_m2} (ref >59)
Globulin, Total: 2.2 g/dL (ref 1.5–4.5)
Glucose: 88 mg/dL (ref 65–99)
Potassium: 3.5 mmol/L (ref 3.5–5.2)
Sodium: 138 mmol/L (ref 134–144)
Total Protein: 6.3 g/dL (ref 6.0–8.5)

## 2020-04-26 LAB — LIPID PANEL
Chol/HDL Ratio: 5.3 ratio — ABNORMAL HIGH (ref 0.0–5.0)
Cholesterol, Total: 213 mg/dL — ABNORMAL HIGH (ref 100–199)
HDL: 40 mg/dL (ref >39)
LDL Chol Calc (NIH): 130 mg/dL — ABNORMAL HIGH (ref 0–99)
Triglycerides: 242 mg/dL — ABNORMAL HIGH (ref 0–149)
VLDL Cholesterol Cal: 43 mg/dL — ABNORMAL HIGH (ref 5–40)

## 2020-04-26 LAB — PRO B NATRIURETIC PEPTIDE: NT-Pro BNP: 224 pg/mL (ref 0–486)

## 2020-04-26 NOTE — Telephone Encounter (Signed)
Tried to call patient back. Home and Mobile number did not work and was no longer in service. Will send to Dr. Sande Rives nurse to follow up.

## 2020-04-26 NOTE — Telephone Encounter (Signed)
New message  Patient is calling in to get referral sent to [facility] (Ear Nose and Thro[facility]). Fax number to sent it to would be [phone]

## 2020-04-26 NOTE — Telephone Encounter (Signed)
Patient's wife was given detailed instructions per DPR per  Myocardial Perfusion Study Information Sheet for the test on 05/02/2020 at 1045. Patient notified to arrive 15 minutes early and that it is imperative to arrive on time for appointment to keep from having the test rescheduled.  If you need to cancel or reschedule your appointment, please call the office within 24 hours of your appointment. . Patient verbalized understanding.Atsushi Yom, R[place]e [place] No mychart

## 2020-04-28 ENCOUNTER — Encounter (HOSPITAL_COMMUNITY): Payer: Medicare Other

## 2020-04-29 ENCOUNTER — Telehealth: Payer: Self-pay | Admitting: *Deleted

## 2020-04-29 DIAGNOSIS — E7800 Pure hypercholesterolemia, unspecified: Secondary | ICD-10-CM

## 2020-04-29 NOTE — Telephone Encounter (Addendum)
Left message for pt to call   ----- Message from Lelon Perla, MD sent at 04/26/2020  7:25 AM EDT ----- Add zetia 10 mg daily; lipids and liver 12 weeks Kirk Ruths

## 2020-04-29 NOTE — Telephone Encounter (Signed)
Spoke with pt, he reports he has atorvastatin but is not taking it like he is supposed to. He will restart the atorvastatin once daily and repeat lab work in 3 months.

## 2020-04-29 NOTE — Telephone Encounter (Signed)
Spoke with pt wife, they will contact the medical doctor for referral.

## 2020-05-02 ENCOUNTER — Other Ambulatory Visit: Payer: Self-pay

## 2020-05-02 ENCOUNTER — Ambulatory Visit (HOSPITAL_COMMUNITY): Payer: Medicare Other | Attending: Cardiology

## 2020-05-02 DIAGNOSIS — R0602 Shortness of breath: Secondary | ICD-10-CM | POA: Diagnosis not present

## 2020-05-02 LAB — MYOCARDIAL PERFUSION IMAGING
LV dias vol: 132 mL (ref 62–150)
LV sys vol: 60 mL
Peak HR: 73 {beats}/min
Rest HR: 56 {beats}/min
SDS: 4
SRS: 2
SSS: 6
TID: 1.13

## 2020-05-02 MED ORDER — TECHNETIUM TC 99M TETROFOSMIN IV KIT
31.8000 | PACK | Freq: Once | INTRAVENOUS | Status: AC | PRN
  Administered 2020-05-02: 31.8 via INTRAVENOUS
  Filled 2020-05-02: qty 32

## 2020-05-02 MED ORDER — REGADENOSON 0.4 MG/5ML IV SOLN
0.4000 mg | Freq: Once | INTRAVENOUS | Status: AC
  Administered 2020-05-02: 0.4 mg via INTRAVENOUS

## 2020-05-02 MED ORDER — TECHNETIUM TC 99M TETROFOSMIN IV KIT
11.0000 | PACK | Freq: Once | INTRAVENOUS | Status: AC | PRN
  Administered 2020-05-02: 11 via INTRAVENOUS
  Filled 2020-05-02: qty 11

## 2020-05-12 DIAGNOSIS — Z87891 Personal history of nicotine dependence: Secondary | ICD-10-CM | POA: Diagnosis not present

## 2020-05-12 DIAGNOSIS — J449 Chronic obstructive pulmonary disease, unspecified: Secondary | ICD-10-CM | POA: Diagnosis not present

## 2020-05-12 DIAGNOSIS — R21 Rash and other nonspecific skin eruption: Secondary | ICD-10-CM | POA: Diagnosis not present

## 2020-05-12 DIAGNOSIS — I10 Essential (primary) hypertension: Secondary | ICD-10-CM | POA: Diagnosis not present

## 2020-05-12 DIAGNOSIS — Z299 Encounter for prophylactic measures, unspecified: Secondary | ICD-10-CM | POA: Diagnosis not present

## 2020-05-12 DIAGNOSIS — J42 Unspecified chronic bronchitis: Secondary | ICD-10-CM | POA: Diagnosis not present

## 2020-05-30 DIAGNOSIS — Z1159 Encounter for screening for other viral diseases: Secondary | ICD-10-CM | POA: Diagnosis not present

## 2020-06-07 DIAGNOSIS — Z87891 Personal history of nicotine dependence: Secondary | ICD-10-CM | POA: Diagnosis not present

## 2020-06-07 DIAGNOSIS — U071 COVID-19: Secondary | ICD-10-CM | POA: Diagnosis not present

## 2020-06-07 DIAGNOSIS — Z299 Encounter for prophylactic measures, unspecified: Secondary | ICD-10-CM | POA: Diagnosis not present

## 2020-06-07 DIAGNOSIS — J449 Chronic obstructive pulmonary disease, unspecified: Secondary | ICD-10-CM | POA: Diagnosis not present

## 2020-06-09 DIAGNOSIS — Z299 Encounter for prophylactic measures, unspecified: Secondary | ICD-10-CM | POA: Diagnosis not present

## 2020-06-09 DIAGNOSIS — G4730 Sleep apnea, unspecified: Secondary | ICD-10-CM | POA: Diagnosis not present

## 2020-06-09 DIAGNOSIS — J449 Chronic obstructive pulmonary disease, unspecified: Secondary | ICD-10-CM | POA: Diagnosis not present

## 2020-06-15 DIAGNOSIS — U071 COVID-19: Secondary | ICD-10-CM | POA: Diagnosis not present

## 2020-06-15 DIAGNOSIS — R05 Cough: Secondary | ICD-10-CM | POA: Diagnosis not present

## 2020-06-15 DIAGNOSIS — I517 Cardiomegaly: Secondary | ICD-10-CM | POA: Diagnosis not present

## 2020-06-15 DIAGNOSIS — J9811 Atelectasis: Secondary | ICD-10-CM | POA: Diagnosis not present

## 2020-07-18 ENCOUNTER — Ambulatory Visit: Payer: No Typology Code available for payment source | Admitting: Cardiology

## 2020-08-17 ENCOUNTER — Other Ambulatory Visit: Payer: Self-pay | Admitting: Nurse Practitioner

## 2020-08-17 DIAGNOSIS — Z0189 Encounter for other specified special examinations: Secondary | ICD-10-CM

## 2020-08-24 ENCOUNTER — Other Ambulatory Visit: Payer: Self-pay | Admitting: Nurse Practitioner

## 2020-08-24 DIAGNOSIS — Z0189 Encounter for other specified special examinations: Secondary | ICD-10-CM

## 2020-08-24 DIAGNOSIS — I712 Thoracic aortic aneurysm, without rupture, unspecified: Secondary | ICD-10-CM

## 2020-09-06 ENCOUNTER — Other Ambulatory Visit (HOSPITAL_COMMUNITY): Payer: Self-pay | Admitting: Nurse Practitioner

## 2020-09-06 DIAGNOSIS — I712 Thoracic aortic aneurysm, without rupture, unspecified: Secondary | ICD-10-CM

## 2020-09-06 DIAGNOSIS — Z0189 Encounter for other specified special examinations: Secondary | ICD-10-CM

## 2020-09-07 ENCOUNTER — Other Ambulatory Visit: Payer: Medicare Other

## 2020-09-14 ENCOUNTER — Ambulatory Visit (HOSPITAL_COMMUNITY): Admission: RE | Admit: 2020-09-14 | Payer: Medicare Other | Source: Ambulatory Visit

## 2020-09-14 ENCOUNTER — Encounter (HOSPITAL_COMMUNITY): Payer: Self-pay

## 2020-10-07 DIAGNOSIS — J42 Unspecified chronic bronchitis: Secondary | ICD-10-CM | POA: Diagnosis not present

## 2020-10-07 DIAGNOSIS — J449 Chronic obstructive pulmonary disease, unspecified: Secondary | ICD-10-CM | POA: Diagnosis not present

## 2020-10-07 DIAGNOSIS — Z299 Encounter for prophylactic measures, unspecified: Secondary | ICD-10-CM | POA: Diagnosis not present

## 2020-12-07 DIAGNOSIS — R5383 Other fatigue: Secondary | ICD-10-CM | POA: Diagnosis not present

## 2020-12-07 DIAGNOSIS — Z0000 Encounter for general adult medical examination without abnormal findings: Secondary | ICD-10-CM | POA: Diagnosis not present

## 2020-12-07 DIAGNOSIS — Z87891 Personal history of nicotine dependence: Secondary | ICD-10-CM | POA: Diagnosis not present

## 2020-12-07 DIAGNOSIS — I5032 Chronic diastolic (congestive) heart failure: Secondary | ICD-10-CM | POA: Diagnosis not present

## 2020-12-07 DIAGNOSIS — Z7189 Other specified counseling: Secondary | ICD-10-CM | POA: Diagnosis not present

## 2020-12-07 DIAGNOSIS — E7800 Pure hypercholesterolemia, unspecified: Secondary | ICD-10-CM | POA: Diagnosis not present

## 2020-12-07 DIAGNOSIS — I712 Thoracic aortic aneurysm, without rupture: Secondary | ICD-10-CM | POA: Diagnosis not present

## 2020-12-07 DIAGNOSIS — Z79899 Other long term (current) drug therapy: Secondary | ICD-10-CM | POA: Diagnosis not present

## 2020-12-07 DIAGNOSIS — I25118 Atherosclerotic heart disease of native coronary artery with other forms of angina pectoris: Secondary | ICD-10-CM | POA: Diagnosis not present

## 2020-12-07 DIAGNOSIS — I10 Essential (primary) hypertension: Secondary | ICD-10-CM | POA: Diagnosis not present

## 2020-12-07 DIAGNOSIS — Z299 Encounter for prophylactic measures, unspecified: Secondary | ICD-10-CM | POA: Diagnosis not present

## 2021-03-02 DIAGNOSIS — J329 Chronic sinusitis, unspecified: Secondary | ICD-10-CM | POA: Diagnosis not present

## 2021-03-02 DIAGNOSIS — J449 Chronic obstructive pulmonary disease, unspecified: Secondary | ICD-10-CM | POA: Diagnosis not present

## 2021-03-02 DIAGNOSIS — Z299 Encounter for prophylactic measures, unspecified: Secondary | ICD-10-CM | POA: Diagnosis not present

## 2021-03-02 DIAGNOSIS — Z87891 Personal history of nicotine dependence: Secondary | ICD-10-CM | POA: Diagnosis not present

## 2021-03-14 DIAGNOSIS — L304 Erythema intertrigo: Secondary | ICD-10-CM | POA: Diagnosis not present

## 2021-07-31 DIAGNOSIS — G4700 Insomnia, unspecified: Secondary | ICD-10-CM | POA: Diagnosis not present

## 2021-07-31 DIAGNOSIS — I10 Essential (primary) hypertension: Secondary | ICD-10-CM | POA: Diagnosis not present

## 2021-09-04 ENCOUNTER — Encounter: Payer: Self-pay | Admitting: *Deleted

## 2021-09-06 DIAGNOSIS — Z299 Encounter for prophylactic measures, unspecified: Secondary | ICD-10-CM | POA: Diagnosis not present

## 2021-09-06 DIAGNOSIS — I10 Essential (primary) hypertension: Secondary | ICD-10-CM | POA: Diagnosis not present

## 2021-09-06 DIAGNOSIS — J069 Acute upper respiratory infection, unspecified: Secondary | ICD-10-CM | POA: Diagnosis not present

## 2021-09-06 DIAGNOSIS — I5032 Chronic diastolic (congestive) heart failure: Secondary | ICD-10-CM | POA: Diagnosis not present

## 2021-09-06 DIAGNOSIS — J449 Chronic obstructive pulmonary disease, unspecified: Secondary | ICD-10-CM | POA: Diagnosis not present

## 2021-10-05 DIAGNOSIS — R609 Edema, unspecified: Secondary | ICD-10-CM | POA: Diagnosis not present

## 2021-10-05 DIAGNOSIS — Z299 Encounter for prophylactic measures, unspecified: Secondary | ICD-10-CM | POA: Diagnosis not present

## 2021-10-05 DIAGNOSIS — R21 Rash and other nonspecific skin eruption: Secondary | ICD-10-CM | POA: Diagnosis not present

## 2021-10-05 DIAGNOSIS — J069 Acute upper respiratory infection, unspecified: Secondary | ICD-10-CM | POA: Diagnosis not present

## 2021-11-06 DIAGNOSIS — Z87891 Personal history of nicotine dependence: Secondary | ICD-10-CM | POA: Diagnosis not present

## 2021-11-06 DIAGNOSIS — Z87828 Personal history of other (healed) physical injury and trauma: Secondary | ICD-10-CM | POA: Diagnosis not present

## 2021-11-06 DIAGNOSIS — J3489 Other specified disorders of nose and nasal sinuses: Secondary | ICD-10-CM | POA: Diagnosis not present

## 2021-11-06 DIAGNOSIS — R0982 Postnasal drip: Secondary | ICD-10-CM | POA: Diagnosis not present

## 2021-11-06 DIAGNOSIS — J342 Deviated nasal septum: Secondary | ICD-10-CM | POA: Diagnosis not present

## 2021-11-06 DIAGNOSIS — Z85818 Personal history of malignant neoplasm of other sites of lip, oral cavity, and pharynx: Secondary | ICD-10-CM | POA: Diagnosis not present

## 2021-11-06 DIAGNOSIS — Z923 Personal history of irradiation: Secondary | ICD-10-CM | POA: Diagnosis not present

## 2021-11-06 DIAGNOSIS — K219 Gastro-esophageal reflux disease without esophagitis: Secondary | ICD-10-CM | POA: Diagnosis not present

## 2021-12-08 DIAGNOSIS — Z0000 Encounter for general adult medical examination without abnormal findings: Secondary | ICD-10-CM | POA: Diagnosis not present

## 2021-12-08 DIAGNOSIS — Z7189 Other specified counseling: Secondary | ICD-10-CM | POA: Diagnosis not present

## 2021-12-08 DIAGNOSIS — Z789 Other specified health status: Secondary | ICD-10-CM | POA: Diagnosis not present

## 2021-12-08 DIAGNOSIS — Z299 Encounter for prophylactic measures, unspecified: Secondary | ICD-10-CM | POA: Diagnosis not present

## 2021-12-08 DIAGNOSIS — I10 Essential (primary) hypertension: Secondary | ICD-10-CM | POA: Diagnosis not present

## 2021-12-12 DIAGNOSIS — E785 Hyperlipidemia, unspecified: Secondary | ICD-10-CM | POA: Diagnosis not present

## 2021-12-12 DIAGNOSIS — R5383 Other fatigue: Secondary | ICD-10-CM | POA: Diagnosis not present

## 2021-12-23 IMAGING — CT CT ANGIO CHEST
1 series · 19 of 32 positions shown · IV contrast (agent unspecified)
Comparison: 08/20/2018

CLINICAL DATA: Follow-up dilated aortic root

EXAM:
CT ANGIOGRAPHY CHEST WITH CONTRAST
TECHNIQUE: Multidetector CT imaging of the chest was performed using the
standard protocol during bolus administration of intravenous
contrast. Multiplanar CT image reconstructions and MIPs were
obtained to evaluate the vascular anatomy.
CONTRAST:  75mL [id number]

[Series 4: chest angio · axial · 0.79mm/px · z∈[-354,-33]mm · 19 of 115 slices shown]
[im 4/115  lung]
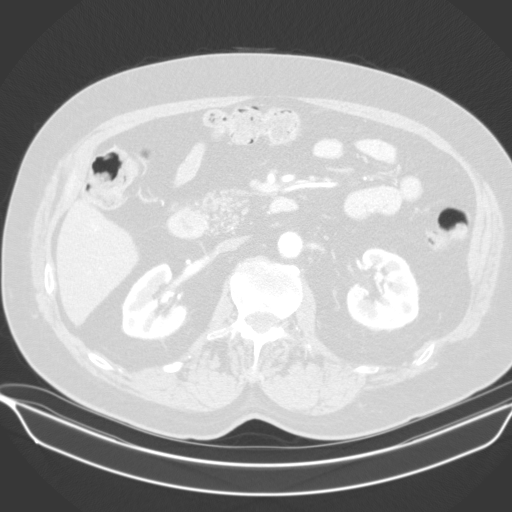
[im 12/115  soft-tissue]
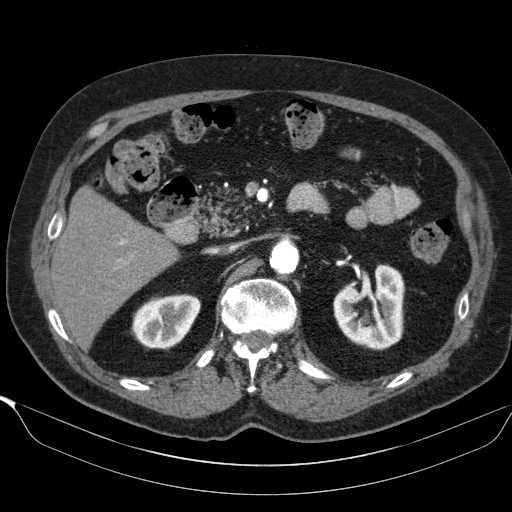
[im 15/115  lung]
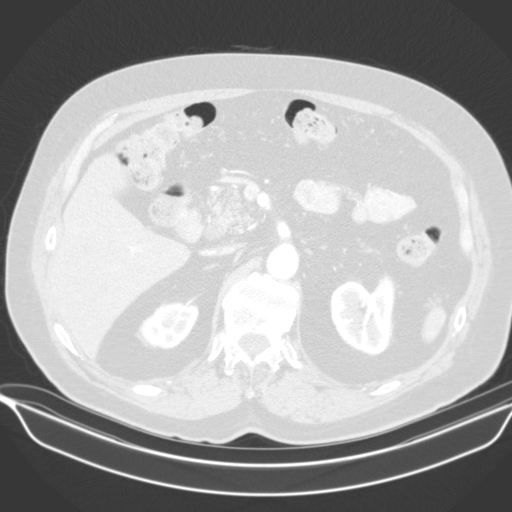
[im 23/115  soft-tissue]
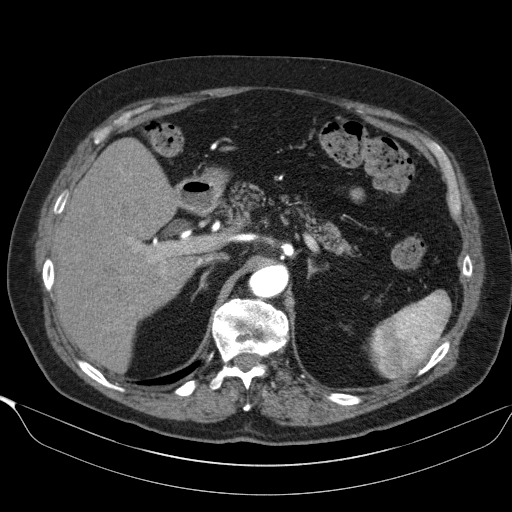
[im 30/115  lung]
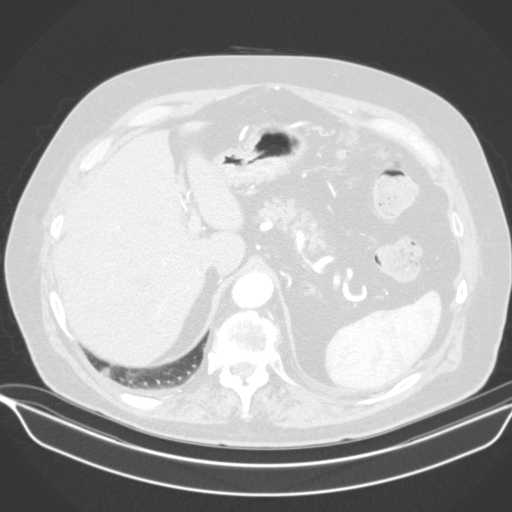
[im 34/115  soft-tissue]
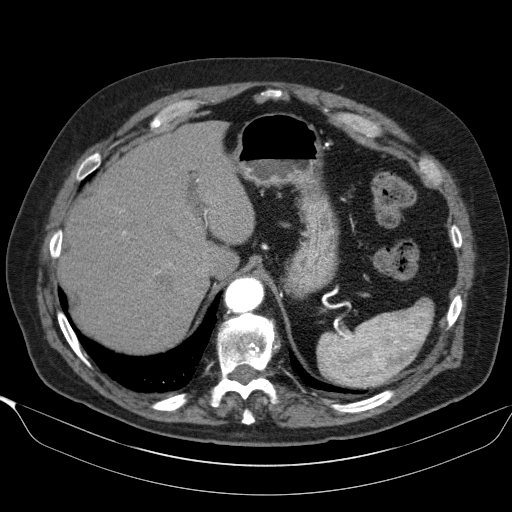
[im 41/115  lung]
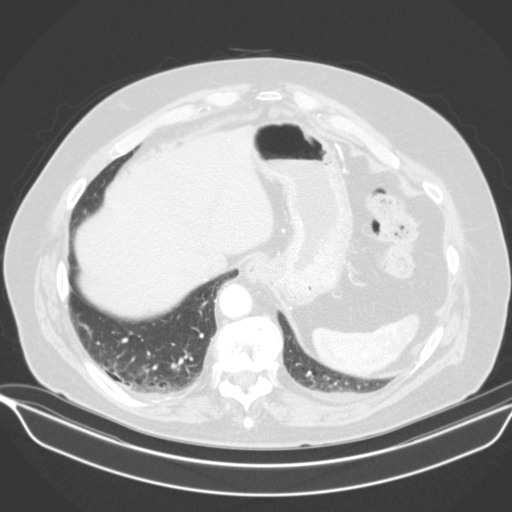
[im 45/115  soft-tissue]
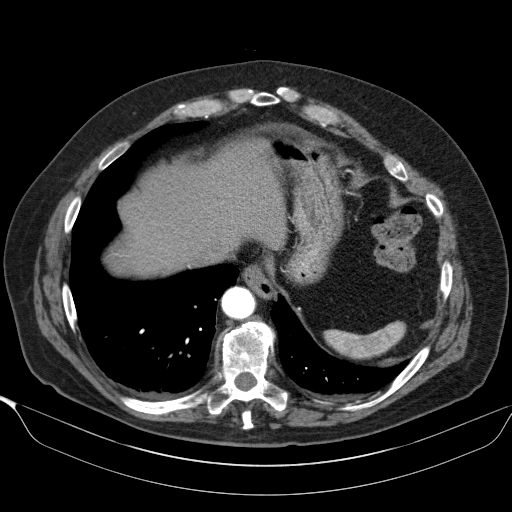
[im 52/115  lung]
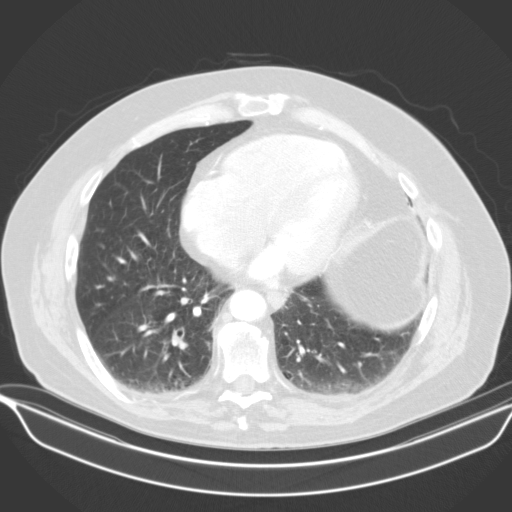
[im 59/115  soft-tissue]
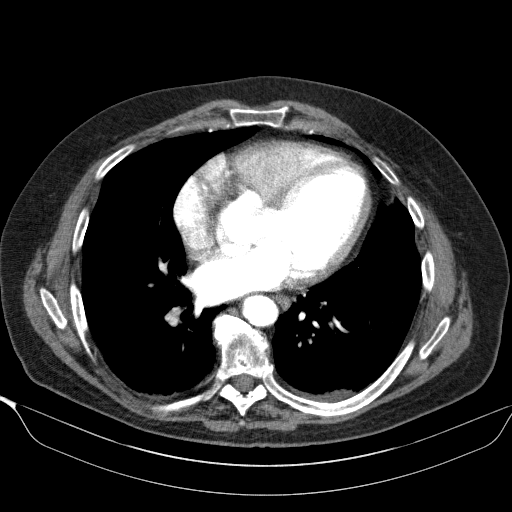
[im 63/115  lung]
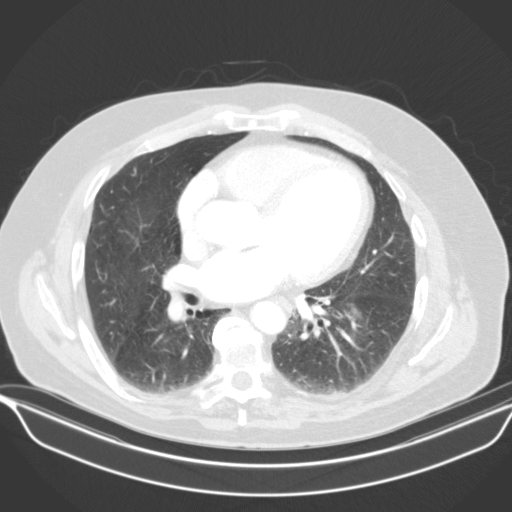
[im 70/115  soft-tissue]
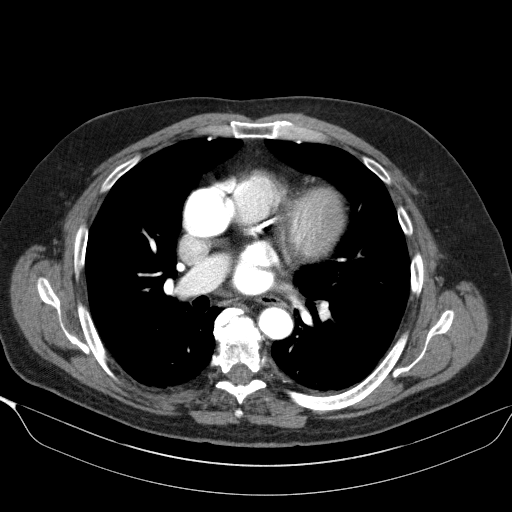
[im 74/115  lung]
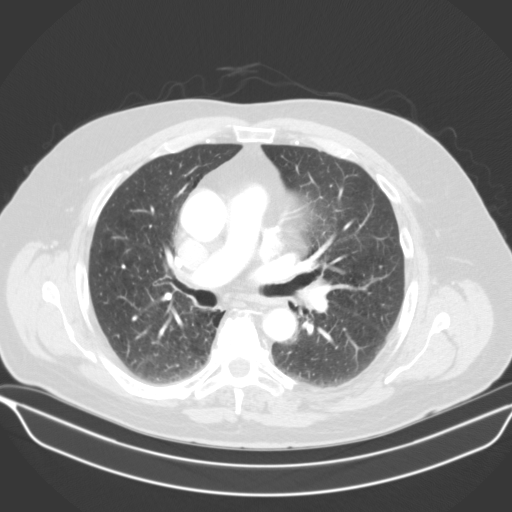
[im 81/115  soft-tissue]
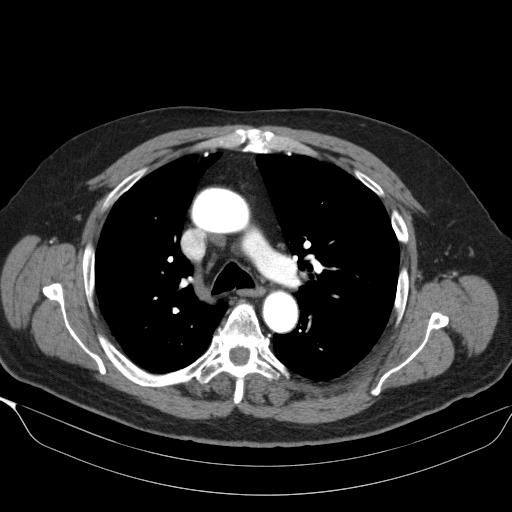
[im 85/115  lung]
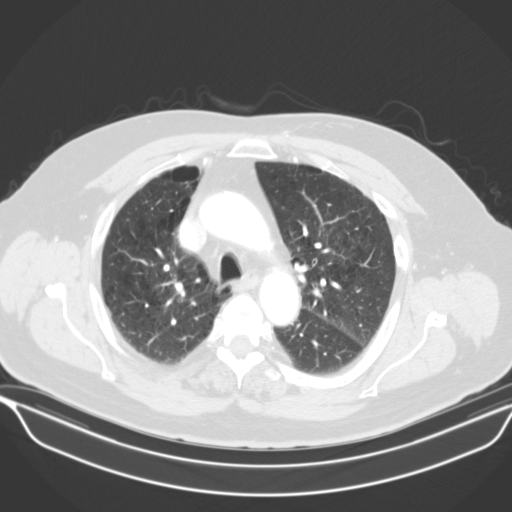
[im 92/115  soft-tissue]
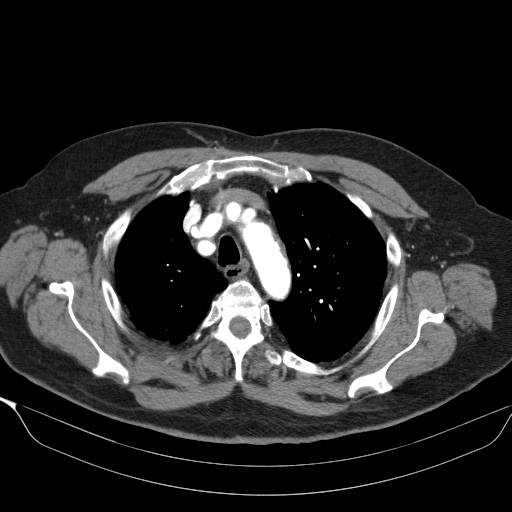
[im 100/115  lung]
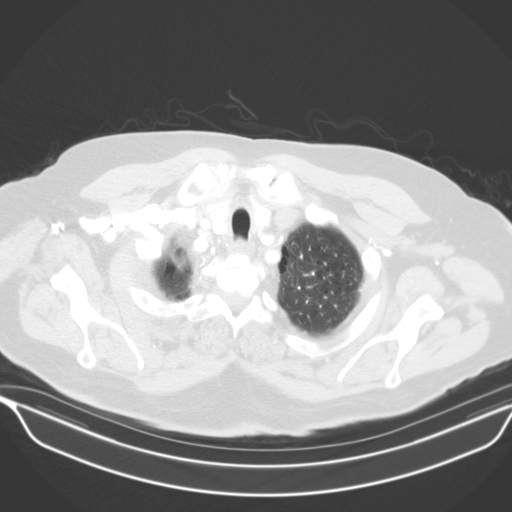
[im 103/115  soft-tissue]
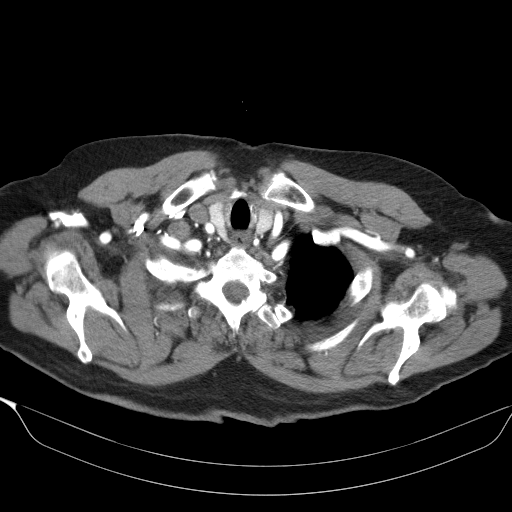
[im 111/115  lung]
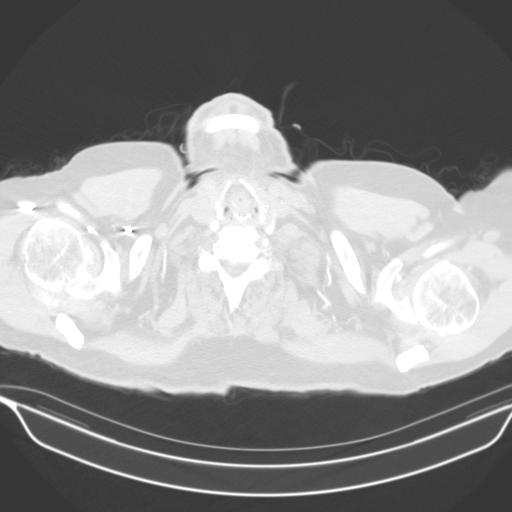

[19 of 32 positions shown; findings below may reference images not displayed]

FINDINGS: Cardiovascular: Thoracic aorta again demonstrates atherosclerotic
calcifications. Some dilatation of the aortic root is again noted
measuring 4.4 cm on today's exam. This is stable from the prior
study. No ascending aortic dilatation is seen. The arch and
descending aorta show no aneurysmal dilatation. Heart is at the
upper limits of normal in size. The pulmonary artery centrally is
within normal limits although peripheral opacification is limited.
There are changes consistent with prior coronary stent placement
stable from the prior study.

Mediastinum/Nodes: Thoracic inlet is within normal limits. No hilar
or mediastinal adenopathy is noted. The esophagus as visualized is
within normal limits.

Lungs/Pleura: Emphysematous changes are again identified. Some mild
dependent atelectatic changes are seen. No focal infiltrate or
sizable effusion is noted. No sizable parenchymal nodules are seen.

Upper Abdomen: Visualized upper abdomen shows changes of prior
cholecystectomy. A left renal cyst is again noted and stable. No
other focal abnormality is seen.

Musculoskeletal: Degenerative changes of the thoracic spine are
seen.

Review of the MIP images confirms the above findings.
IMPRESSION: Stable dilatation of the aortic root.

No evidence of aneurysmal dilatation of the ascending aorta or
descending aorta.

Stable emphysematous changes.

Aortic Atherosclerosis (F1VP5-3O4.4) and Emphysema (F1VP5-464.2).

## 2021-12-28 ENCOUNTER — Telehealth: Payer: Self-pay | Admitting: Cardiology

## 2021-12-28 NOTE — Telephone Encounter (Signed)
Pt c/o BP issue: STAT if pt c/o blurred vision, one-sided weakness or slurred speech ? ?1. What are your last 5 BP readings? 142/56, 124/54, 121/51 ? ?2. Are you having any other symptoms (ex. Dizziness, headache, blurred vision, passed out)? Headache and lightheadedness  ? ?3. What is your BP issue? Pt is continuously getting headaches and his diastolic level continues to be in the 50's.... son is concerned.. please advise further  ? ?

## 2021-12-28 NOTE — Telephone Encounter (Signed)
Contacted son- advised of appointment for tomorrow at 2:15 PM with Diona Browner, NP. ?Patient son verbalized understanding of this.  ? ?Will route to Raquel Sarna to make aware.  ?Thanks!  ? ?

## 2021-12-28 NOTE — Telephone Encounter (Signed)
He has hydralazine '25mg'$  BID on his med list, not HCTZ '25mg'$  BID - please clarify. He also has not been seen in the office in almost 2 years. He should be evaluated in person as he's overdue for follow up. ?

## 2021-12-28 NOTE — Telephone Encounter (Signed)
Contacted patient, spoke with son- he states for the past few weeks his dad has been dizzy and having headaches, they started checking his blood pressure and noticed that his diastolic bp is always on the low side- it stays in the low 50's. (See blood pressure numbers below) the son states this is concerning to them and they feel it is the reason behind why he feels the way he does.  ? ?Patient son denies his dad complaining of chest pains, shortness of breath, only complaints is headache and feeling dizzy. Patient has had no falls, and is at times fatigued and has no energy. No vision changes and no medication changes.  ? ?Patient is on amlodipine 10 mg daily and HCTZ 25 mg twice daily- he takes both of these.  ? ?I advised patient son I would send to [facility] to advise further and give any recommendations.  ? ?Thanks! ?

## 2021-12-29 ENCOUNTER — Telehealth: Payer: Self-pay

## 2021-12-29 ENCOUNTER — Encounter: Payer: Self-pay | Admitting: Nurse Practitioner

## 2021-12-29 ENCOUNTER — Ambulatory Visit: Payer: Medicare Other | Admitting: Nurse Practitioner

## 2021-12-29 VITALS — BP 140/66 | HR 65 | Ht 68.0 in | Wt 210.0 lb

## 2021-12-29 DIAGNOSIS — I2510 Atherosclerotic heart disease of native coronary artery without angina pectoris: Secondary | ICD-10-CM | POA: Diagnosis not present

## 2021-12-29 DIAGNOSIS — E785 Hyperlipidemia, unspecified: Secondary | ICD-10-CM | POA: Diagnosis not present

## 2021-12-29 DIAGNOSIS — I340 Nonrheumatic mitral (valve) insufficiency: Secondary | ICD-10-CM | POA: Diagnosis not present

## 2021-12-29 DIAGNOSIS — I77810 Thoracic aortic ectasia: Secondary | ICD-10-CM

## 2021-12-29 DIAGNOSIS — G4733 Obstructive sleep apnea (adult) (pediatric): Secondary | ICD-10-CM

## 2021-12-29 DIAGNOSIS — I10 Essential (primary) hypertension: Secondary | ICD-10-CM | POA: Diagnosis not present

## 2021-12-29 DIAGNOSIS — I714 Abdominal aortic aneurysm, without rupture, unspecified: Secondary | ICD-10-CM

## 2021-12-29 DIAGNOSIS — I5032 Chronic diastolic (congestive) heart failure: Secondary | ICD-10-CM | POA: Diagnosis not present

## 2021-12-29 DIAGNOSIS — I712 Thoracic aortic aneurysm, without rupture, unspecified: Secondary | ICD-10-CM

## 2021-12-29 DIAGNOSIS — I351 Nonrheumatic aortic (valve) insufficiency: Secondary | ICD-10-CM

## 2021-12-29 NOTE — Telephone Encounter (Signed)
Pt was seen in office today 12/29/2021 by Diona Browner, NP. Raquel Sarna is ordering an Echocardiogram, Angio Ct of Chest/Aorta and CT Angio of Abdomen. In order for pt to have these procedures, he needs an approval from the [place]. Please contact [place]. FAX number is [phone]. ?
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
## 2021-12-29 NOTE — Patient Instructions (Addendum)
Medication Instructions:  ?Your physician recommends that you continue on your current medications as directed. Please refer to the Current Medication list given to you today. ? ?*If you need a refill on your cardiac medications before your next appointment, please call your pharmacy* ? ? ?Lab Work: ?Your physician recommends that you complete labs today  ?BMET ? ?If you have labs (blood work) drawn today and your tests are completely normal, you will receive your results only by: ?MyChart Message (if you have MyChart) OR ?A paper copy in the mail ?If you have any lab test that is abnormal or we need to change your treatment, we will call you to review the results. ? ? ?Testing/Procedures: ?Your physician has requested that you have an echocardiogram. Echocardiography is a painless test that uses sound waves to create images of your heart. It provides your doctor with information about the size and shape of your heart and how well your heart?s chambers and valves are working. This procedure takes approximately one hour. There are no restrictions for this procedure.  ? ?Non-Cardiac CT Angiography (CTA), is a special type of CT scan that uses a computer to produce multi-dimensional views of major blood vessels throughout the body. In CT angiography, a contrast material is injected through an IV to help visualize the blood vessels  ? ?Follow-Up: ?At [facility], you and your health needs are our priority.  As part of our continuing mission to provide you with exceptional heart care, we have created designated Provider Care Teams.  These Care Teams include your primary Cardiologist (physician) and Advanced Practice Providers (APPs -  Physician Assistants and Nurse Practitioners) who all work together to provide you with the care you need, when you need it. ? ?We recommend signing up for the patient portal called "MyChart".  Sign up information is provided on this After Visit Summary.  MyChart is used to connect with  patients for Virtual Visits (Telemedicine).  Patients are able to view lab/test results, encounter notes, upcoming appointments, etc.  Non-urgent messages can be sent to your provider as well.   ?To learn more about what you can do with MyChart, go to [email or web address].   ? ?Your next appointment:   ?2-3 month(s) ? ?The format for your next appointment:   ?In Person ? ?Provider:   ?Kirk Ruths, MD   ? ? ?Other Instructions ?Heart Failure Education: ? ?Weigh yourself EVERY morning after you go to the bathroom but before you eat or drink anything. Write this number down in a weight log/diary. If you gain 3 pounds overnight or 5 pounds in a week, call the office. ?Take your medicines as prescribed. If you have concerns about your medications, please call us before you stop taking them. ?Eat low salt foods--Limit salt (sodium) to 2000 mg per day. This will help prevent your body from holding onto fluid. Read food labels as many processed foods have a lot of sodium, especially canned goods and prepackaged meats. If you would like some assistance choosing low sodium foods, we would be happy to set you up with a nutritionist. ?Limit all fluids for the day to less than 2 liters (64 ounces). Fluid includes all drinks, coffee, juice, ice chips, soup, jello, and all other liquids. ?Stay as active as you can everyday. Staying active will give you more energy and make your muscles stronger. Start with 5 minutes at a time and work your way up to 30 minutes a day. Break up your activities--do some in the  morning and some in the afternoon. Start with 3 days per week and work your way up to 5 days as you can.  If you have chest pain, feel short of breath, dizzy, or lightheaded, STOP. If you don't feel better after a short rest, call 911. If you do feel better, call the office to let us know you have symptoms with exercise. ? ?

## 2021-12-30 LAB — BASIC METABOLIC PANEL
BUN/Creatinine Ratio: 14 (ref 10–24)
BUN: 13 mg/dL (ref 8–27)
CO2: 26 mmol/L (ref 20–29)
Calcium: 9.6 mg/dL (ref 8.6–10.2)
Chloride: 100 mmol/L (ref 96–106)
Creatinine, Ser: 0.94 mg/dL (ref 0.76–1.27)
Glucose: 107 mg/dL — ABNORMAL HIGH (ref 70–99)
Potassium: 3.8 mmol/L (ref 3.5–5.2)
Sodium: 143 mmol/L (ref 134–144)
eGFR: 81 mL/min/{1.73_m2} (ref >59)

## 2022-01-03 ENCOUNTER — Telehealth: Payer: Self-pay | Admitting: Nurse Practitioner

## 2022-01-03 ENCOUNTER — Telehealth: Payer: Self-pay

## 2022-01-03 NOTE — Telephone Encounter (Signed)
Spoke with pts daughter. She was notified of her father's results. Pt will follow up as planned.  ?

## 2022-01-03 NOTE — Telephone Encounter (Addendum)
Patient's daughter called stating they need the notes to his last office visit so they can approve the tests. It can be faxed to [phone] Attn: PACK TEAM 2 ?Please called patient daughter once they have been faxed.  ?

## 2022-01-03 NOTE — Telephone Encounter (Signed)
Patient's daughter called for lab results for her father.  ?

## 2022-01-03 NOTE — Telephone Encounter (Signed)
Called pt's daughter to go over the results. She requested the results be sent to the [place]. The labs and last office notes have been forwarded to pt's PCP. She thanked me for sending the information.  ?

## 2022-01-08 DIAGNOSIS — E876 Hypokalemia: Secondary | ICD-10-CM | POA: Diagnosis not present

## 2022-01-08 DIAGNOSIS — Z299 Encounter for prophylactic measures, unspecified: Secondary | ICD-10-CM | POA: Diagnosis not present

## 2022-01-08 DIAGNOSIS — B370 Candidal stomatitis: Secondary | ICD-10-CM | POA: Diagnosis not present

## 2022-01-22 ENCOUNTER — Encounter (HOSPITAL_COMMUNITY): Payer: Self-pay | Admitting: Nurse Practitioner

## 2022-02-05 ENCOUNTER — Telehealth: Payer: Self-pay | Admitting: Cardiology

## 2022-02-05 NOTE — Telephone Encounter (Signed)
The patient has pending study from prior visit. Then will need phone visit.  ?

## 2022-02-05 NOTE — Telephone Encounter (Signed)
? ?  Pre-operative Risk Assessment  ?  ?Patient Name: [name]  ?DOB: 1941-06-13 ?MRN: [id number]  ? ?  ? ?Request for Surgical Clearance   ? ?Procedure:   Right Carpal Tunnel Release.  ? ?Date of Surgery:  Clearance 02/12/22                              ?   ?Surgeon:  None Listed ?Surgeon's Group or Practice Name:  [facility].  ?Phone number:  [phone] ?Fax number:  [phone] ?  ?Type of Clearance Requested:   ?- Pharmacy:  Hold Aspirin X 7 days starting today. ?  ?Type of Anesthesia:  Not Indicated ?  ?Additional requests/questions:   ? ?Signed, ?Daegen Berrocal Avanell Shackleton   ?02/05/2022, 12:27 PM   ?

## 2022-02-05 NOTE — Telephone Encounter (Signed)
Follow Up; ?: ? ? ?Amber is checking on the status of clearance that was faxed on 01-23-22. ?

## 2022-02-05 NOTE — Telephone Encounter (Signed)
LM for Amber to fax clearance to office.  ?

## 2022-02-05 NOTE — Telephone Encounter (Signed)
Lvm for pt to call office to set up phone visit for pre-op clearance.  ?

## 2022-02-06 NOTE — Telephone Encounter (Signed)
Lvm for patient call our office back to schedule a virtual visit.  ?

## 2022-02-07 ENCOUNTER — Telehealth: Payer: Self-pay | Admitting: *Deleted

## 2022-02-07 NOTE — Telephone Encounter (Signed)
I s/w the pt's son, ok per pt. Pt has been scheduled for tele pre op appt 02/09/22 @ 11 am. See previous notes. Med rec still needs to be done. Consent has been given.  ? ?  ?Patient Consent for Virtual Visit  ? ? ?   ? ?[name] has provided verbal consent on 02/07/2022 for a virtual visit (video or telephone). ? ? ?CONSENT FOR VIRTUAL VISIT FOR:  [name]  ?By participating in this virtual visit I agree to the following: ? ?I hereby voluntarily request, consent and authorize [place] and its employed or contracted physicians, physician assistants, nurse practitioners or other licensed health care professionals (the Practitioner), to provide me with telemedicine health care services (the ?Services") as deemed necessary by the treating Practitioner. I acknowledge and consent to receive the Services by the Practitioner via telemedicine. I understand that the telemedicine visit will involve communicating with the Practitioner through live audiovisual communication technology and the disclosure of certain medical information by electronic transmission. I acknowledge that I have been given the opportunity to request an in-person assessment or other available alternative prior to the telemedicine visit and am voluntarily participating in the telemedicine visit. ? ?I understand that I have the right to withhold or withdraw my consent to the use of telemedicine in the course of my care at any time, without affecting my right to future care or treatment, and that the Practitioner or I may terminate the telemedicine visit at any time. I understand that I have the right to inspect all information obtained and/or recorded in the course of the telemedicine visit and may receive copies of available information for a reasonable fee.  I understand that some of the potential risks of receiving the Services via telemedicine include:  ?Delay or interruption in medical evaluation due to technological equipment  failure or disruption; ?Information transmitted may not be sufficient (e.g. poor resolution of images) to allow for appropriate medical decision making by the Practitioner; and/or  ?In rare instances, security protocols could fail, causing a breach of personal health information. ? ?Furthermore, I acknowledge that it is my responsibility to provide information about my medical history, conditions and care that is complete and accurate to the best of my ability. I acknowledge that Practitioner's advice, recommendations, and/or decision may be based on factors not within their control, such as incomplete or inaccurate data provided by me or distortions of diagnostic images or specimens that may result from electronic transmissions. I understand that the practice of medicine is not an exact science and that Practitioner makes no warranties or guarantees regarding treatment outcomes. I acknowledge that a copy of this consent can be made available to me via my patient portal ([place]), or I can request a printed copy by calling the office of [place].   ? ?I understand that my insurance will be billed for this visit.  ? ?I have read or had this consent read to me. ?I understand the contents of this consent, which adequately explains the benefits and risks of the Services being provided via telemedicine.  ?I have been provided ample opportunity to ask questions regarding this consent and the Services and have had my questions answered to my satisfaction. ?I give my informed consent for the services to be provided through the use of telemedicine in my medical care ? ? ? ?

## 2022-02-07 NOTE — Telephone Encounter (Signed)
I will send FYI to requesting office the pt will need to be set up for a telephone visit with the pre op provider. Our office has tried x 3 to reach the pt. In hopes the surgeon office s/w the pt that they may pass onto him that he needs to call his cardiologist office for appt.  ?

## 2022-02-07 NOTE — Telephone Encounter (Signed)
I s/w the pt's son, ok per pt. Pt has been scheduled for tele pre op appt 02/09/22 @ 11 am. See previous notes. Med rec still needs to be done. Consent has been given.  ? ?  ?Patient Consent for Virtual Visit  ? ? ?   ? ?[name] has provided verbal consent on 02/07/2022 for a virtual visit (video or telephone). ? ? ?CONSENT FOR VIRTUAL VISIT FOR:  [name]  ?By participating in this virtual visit I agree to the following: ? ?I hereby voluntarily request, consent and authorize [place] and its employed or contracted physicians, physician assistants, nurse practitioners or other licensed health care professionals (the Practitioner), to provide me with telemedicine health care services (the ?Services") as deemed necessary by the treating Practitioner. I acknowledge and consent to receive the Services by the Practitioner via telemedicine. I understand that the telemedicine visit will involve communicating with the Practitioner through live audiovisual communication technology and the disclosure of certain medical information by electronic transmission. I acknowledge that I have been given the opportunity to request an in-person assessment or other available alternative prior to the telemedicine visit and am voluntarily participating in the telemedicine visit. ? ?I understand that I have the right to withhold or withdraw my consent to the use of telemedicine in the course of my care at any time, without affecting my right to future care or treatment, and that the Practitioner or I may terminate the telemedicine visit at any time. I understand that I have the right to inspect all information obtained and/or recorded in the course of the telemedicine visit and may receive copies of available information for a reasonable fee.  I understand that some of the potential risks of receiving the Services via telemedicine include:  ?Delay or interruption in medical evaluation due to technological equipment  failure or disruption; ?Information transmitted may not be sufficient (e.g. poor resolution of images) to allow for appropriate medical decision making by the Practitioner; and/or  ?In rare instances, security protocols could fail, causing a breach of personal health information. ? ?Furthermore, I acknowledge that it is my responsibility to provide information about my medical history, conditions and care that is complete and accurate to the best of my ability. I acknowledge that Practitioner's advice, recommendations, and/or decision may be based on factors not within their control, such as incomplete or inaccurate data provided by me or distortions of diagnostic images or specimens that may result from electronic transmissions. I understand that the practice of medicine is not an exact science and that Practitioner makes no warranties or guarantees regarding treatment outcomes. I acknowledge that a copy of this consent can be made available to me via my patient portal ([place]), or I can request a printed copy by calling the office of [place].   ? ?I understand that my insurance will be billed for this visit.  ? ?I have read or had this consent read to me. ?I understand the contents of this consent, which adequately explains the benefits and risks of the Services being provided via telemedicine.  ?I have been provided ample opportunity to ask questions regarding this consent and the Services and have had my questions answered to my satisfaction. ?I give my informed consent for the services to be provided through the use of telemedicine in my medical care ? ? ? ?

## 2022-02-07 NOTE — Telephone Encounter (Signed)
Left message for pt to call back to schedule tele pre op appt. Per Almyra Deforest, PAC, I confirmed ok to set up tele appt Friday 5/12 anytime, and will follow up on the echo results later that day. Reasoning: Surgery is set for 02/12/22, Echo is 02/09/22 @ 1:45. In order to be able to hold ASA in proper time frame, per [facility] call the pt and assess if he has had any chest pain; if no chest pain; then per Isaac Laud the pt can begin to hold ASA.  ?

## 2022-02-09 ENCOUNTER — Ambulatory Visit (INDEPENDENT_AMBULATORY_CARE_PROVIDER_SITE_OTHER): Payer: Medicare Other | Admitting: General Practice

## 2022-02-09 ENCOUNTER — Ambulatory Visit (HOSPITAL_COMMUNITY): Payer: Medicare Other | Attending: Cardiology

## 2022-02-09 DIAGNOSIS — I77810 Thoracic aortic ectasia: Secondary | ICD-10-CM | POA: Diagnosis not present

## 2022-02-09 DIAGNOSIS — I714 Abdominal aortic aneurysm, without rupture, unspecified: Secondary | ICD-10-CM | POA: Diagnosis not present

## 2022-02-09 DIAGNOSIS — E785 Hyperlipidemia, unspecified: Secondary | ICD-10-CM | POA: Diagnosis not present

## 2022-02-09 DIAGNOSIS — I351 Nonrheumatic aortic (valve) insufficiency: Secondary | ICD-10-CM

## 2022-02-09 DIAGNOSIS — G4733 Obstructive sleep apnea (adult) (pediatric): Secondary | ICD-10-CM | POA: Diagnosis not present

## 2022-02-09 DIAGNOSIS — I712 Thoracic aortic aneurysm, without rupture, unspecified: Secondary | ICD-10-CM

## 2022-02-09 DIAGNOSIS — Z01810 Encounter for preprocedural cardiovascular examination: Secondary | ICD-10-CM | POA: Diagnosis not present

## 2022-02-09 DIAGNOSIS — I5032 Chronic diastolic (congestive) heart failure: Secondary | ICD-10-CM | POA: Diagnosis not present

## 2022-02-09 DIAGNOSIS — I2510 Atherosclerotic heart disease of native coronary artery without angina pectoris: Secondary | ICD-10-CM | POA: Diagnosis not present

## 2022-02-09 DIAGNOSIS — I340 Nonrheumatic mitral (valve) insufficiency: Secondary | ICD-10-CM | POA: Diagnosis not present

## 2022-02-09 DIAGNOSIS — I10 Essential (primary) hypertension: Secondary | ICD-10-CM

## 2022-02-09 LAB — ECHOCARDIOGRAM COMPLETE
Area-P 1/2: 2.21 cm2
P 1/2 time: 504 msec
S' Lateral: 4.1 cm
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
## 2022-02-09 NOTE — Progress Notes (Signed)
Clearance notes have been faxed to requesting office.  

## 2022-02-22 ENCOUNTER — Telehealth: Payer: Self-pay

## 2022-02-22 NOTE — Telephone Encounter (Signed)
Spoke with pts son. He was notified of results and recommendations. Pt was asked to complete CT angio that was previously ordered. I sent a message to the scheduling department to arrange CT.
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
## 2022-02-28 ENCOUNTER — Encounter: Payer: Self-pay | Admitting: Nurse Practitioner

## 2022-02-28 ENCOUNTER — Ambulatory Visit: Payer: Medicare Other | Admitting: Nurse Practitioner

## 2022-02-28 VITALS — BP 130/54 | HR 66 | Ht 65.0 in | Wt 209.0 lb

## 2022-02-28 DIAGNOSIS — I714 Abdominal aortic aneurysm, without rupture, unspecified: Secondary | ICD-10-CM

## 2022-02-28 DIAGNOSIS — G4700 Insomnia, unspecified: Secondary | ICD-10-CM | POA: Diagnosis not present

## 2022-02-28 DIAGNOSIS — G4733 Obstructive sleep apnea (adult) (pediatric): Secondary | ICD-10-CM | POA: Diagnosis not present

## 2022-02-28 DIAGNOSIS — I5032 Chronic diastolic (congestive) heart failure: Secondary | ICD-10-CM | POA: Diagnosis not present

## 2022-02-28 DIAGNOSIS — I351 Nonrheumatic aortic (valve) insufficiency: Secondary | ICD-10-CM | POA: Diagnosis not present

## 2022-02-28 DIAGNOSIS — I10 Essential (primary) hypertension: Secondary | ICD-10-CM

## 2022-02-28 DIAGNOSIS — I77810 Thoracic aortic ectasia: Secondary | ICD-10-CM

## 2022-02-28 DIAGNOSIS — B370 Candidal stomatitis: Secondary | ICD-10-CM | POA: Diagnosis not present

## 2022-02-28 DIAGNOSIS — I2510 Atherosclerotic heart disease of native coronary artery without angina pectoris: Secondary | ICD-10-CM

## 2022-02-28 DIAGNOSIS — I712 Thoracic aortic aneurysm, without rupture, unspecified: Secondary | ICD-10-CM

## 2022-02-28 DIAGNOSIS — I340 Nonrheumatic mitral (valve) insufficiency: Secondary | ICD-10-CM | POA: Diagnosis not present

## 2022-02-28 NOTE — Patient Instructions (Addendum)
Medication Instructions:  Your physician recommends that you continue on your current medications as directed. Please refer to the Current Medication list given to you today.   *If you need a refill on your cardiac medications before your next appointment, please call your pharmacy*   Lab Work: NONE ordered at this time of appointment   If you have labs (blood work) drawn today and your tests are completely normal, you will receive your results only by: [place] (if you have MyChart) OR A paper copy in the mail If you have any lab test that is abnormal or we need to change your treatment, we will call you to review the results.   Testing/Procedures: Complete CT Angio Abdomen & Chest. Order previously placed.   Follow-Up: At [facility], you and your health needs are our priority.  As part of our continuing mission to provide you with exceptional heart care, we have created designated Provider Care Teams.  These Care Teams include your primary Cardiologist (physician) and Advanced Practice Providers (APPs -  Physician Assistants and Nurse Practitioners) who all work together to provide you with the care you need, when you need it.  We recommend signing up for the patient portal called "MyChart".  Sign up information is provided on this After Visit Summary.  MyChart is used to connect with patients for Virtual Visits (Telemedicine).  Patients are able to view lab/test results, encounter notes, upcoming appointments, etc.  Non-urgent messages can be sent to your provider as well.   To learn more about what you can do with MyChart, go to [email or web address].    Your next appointment:   6 month(s)  The format for your next appointment:   In Person  Provider:   Kirk Ruths, MD     Other Instructions   Important Information About Sugar

## 2022-03-02 DIAGNOSIS — J060 Acute laryngopharyngitis: Secondary | ICD-10-CM | POA: Diagnosis not present

## 2022-03-02 DIAGNOSIS — Z713 Dietary counseling and surveillance: Secondary | ICD-10-CM | POA: Diagnosis not present

## 2022-03-02 DIAGNOSIS — Z299 Encounter for prophylactic measures, unspecified: Secondary | ICD-10-CM | POA: Diagnosis not present

## 2022-03-02 DIAGNOSIS — I10 Essential (primary) hypertension: Secondary | ICD-10-CM | POA: Diagnosis not present

## 2022-04-20 DIAGNOSIS — B370 Candidal stomatitis: Secondary | ICD-10-CM | POA: Diagnosis not present

## 2022-04-20 DIAGNOSIS — I10 Essential (primary) hypertension: Secondary | ICD-10-CM | POA: Diagnosis not present

## 2022-04-20 DIAGNOSIS — Z299 Encounter for prophylactic measures, unspecified: Secondary | ICD-10-CM | POA: Diagnosis not present

## 2022-04-20 DIAGNOSIS — Z713 Dietary counseling and surveillance: Secondary | ICD-10-CM | POA: Diagnosis not present

## 2022-05-02 DIAGNOSIS — J342 Deviated nasal septum: Secondary | ICD-10-CM | POA: Diagnosis not present

## 2022-05-02 DIAGNOSIS — Z8619 Personal history of other infectious and parasitic diseases: Secondary | ICD-10-CM | POA: Diagnosis not present

## 2022-05-02 DIAGNOSIS — Z85819 Personal history of malignant neoplasm of unspecified site of lip, oral cavity, and pharynx: Secondary | ICD-10-CM | POA: Diagnosis not present

## 2022-05-28 DIAGNOSIS — Z299 Encounter for prophylactic measures, unspecified: Secondary | ICD-10-CM | POA: Diagnosis not present

## 2022-05-28 DIAGNOSIS — I5032 Chronic diastolic (congestive) heart failure: Secondary | ICD-10-CM | POA: Diagnosis not present

## 2022-05-28 DIAGNOSIS — J449 Chronic obstructive pulmonary disease, unspecified: Secondary | ICD-10-CM | POA: Diagnosis not present

## 2022-05-28 DIAGNOSIS — U071 COVID-19: Secondary | ICD-10-CM | POA: Diagnosis not present

## 2022-05-28 DIAGNOSIS — I10 Essential (primary) hypertension: Secondary | ICD-10-CM | POA: Diagnosis not present

## 2022-06-28 DIAGNOSIS — Z299 Encounter for prophylactic measures, unspecified: Secondary | ICD-10-CM | POA: Diagnosis not present

## 2022-06-28 DIAGNOSIS — J329 Chronic sinusitis, unspecified: Secondary | ICD-10-CM | POA: Diagnosis not present

## 2022-06-28 DIAGNOSIS — I10 Essential (primary) hypertension: Secondary | ICD-10-CM | POA: Diagnosis not present

## 2022-07-03 DIAGNOSIS — Z87891 Personal history of nicotine dependence: Secondary | ICD-10-CM | POA: Diagnosis not present

## 2022-07-03 DIAGNOSIS — R059 Cough, unspecified: Secondary | ICD-10-CM | POA: Diagnosis not present

## 2022-07-03 DIAGNOSIS — J069 Acute upper respiratory infection, unspecified: Secondary | ICD-10-CM | POA: Diagnosis not present

## 2022-07-03 DIAGNOSIS — Z299 Encounter for prophylactic measures, unspecified: Secondary | ICD-10-CM | POA: Diagnosis not present

## 2022-07-03 DIAGNOSIS — J42 Unspecified chronic bronchitis: Secondary | ICD-10-CM | POA: Diagnosis not present

## 2022-07-03 DIAGNOSIS — J449 Chronic obstructive pulmonary disease, unspecified: Secondary | ICD-10-CM | POA: Diagnosis not present

## 2022-07-03 DIAGNOSIS — I10 Essential (primary) hypertension: Secondary | ICD-10-CM | POA: Diagnosis not present

## 2022-07-03 DIAGNOSIS — R051 Acute cough: Secondary | ICD-10-CM | POA: Diagnosis not present

## 2022-07-07 ENCOUNTER — Encounter (HOSPITAL_COMMUNITY): Payer: Self-pay

## 2022-07-07 ENCOUNTER — Emergency Department (HOSPITAL_COMMUNITY): Payer: No Typology Code available for payment source

## 2022-07-07 ENCOUNTER — Other Ambulatory Visit: Payer: Self-pay

## 2022-07-07 ENCOUNTER — Observation Stay (HOSPITAL_COMMUNITY)
Admission: EM | Admit: 2022-07-07 | Discharge: 2022-07-10 | Disposition: A | Discharge status: Home / Self Care | Payer: No Typology Code available for payment source | Attending: Internal Medicine | Admitting: Internal Medicine

## 2022-07-07 DIAGNOSIS — J449 Chronic obstructive pulmonary disease, unspecified: Secondary | ICD-10-CM | POA: Diagnosis not present

## 2022-07-07 DIAGNOSIS — I110 Hypertensive heart disease with heart failure: Secondary | ICD-10-CM | POA: Insufficient documentation

## 2022-07-07 DIAGNOSIS — W19XXXA Unspecified fall, initial encounter: Secondary | ICD-10-CM

## 2022-07-07 DIAGNOSIS — Z7982 Long term (current) use of aspirin: Secondary | ICD-10-CM | POA: Insufficient documentation

## 2022-07-07 DIAGNOSIS — Z79899 Other long term (current) drug therapy: Secondary | ICD-10-CM | POA: Insufficient documentation

## 2022-07-07 DIAGNOSIS — Z20822 Contact with and (suspected) exposure to covid-19: Secondary | ICD-10-CM | POA: Diagnosis not present

## 2022-07-07 DIAGNOSIS — I5030 Unspecified diastolic (congestive) heart failure: Secondary | ICD-10-CM | POA: Diagnosis not present

## 2022-07-07 DIAGNOSIS — I2510 Atherosclerotic heart disease of native coronary artery without angina pectoris: Secondary | ICD-10-CM | POA: Diagnosis present

## 2022-07-07 DIAGNOSIS — W010XXA Fall on same level from slipping, tripping and stumbling without subsequent striking against object, initial encounter: Secondary | ICD-10-CM | POA: Diagnosis not present

## 2022-07-07 DIAGNOSIS — K219 Gastro-esophageal reflux disease without esophagitis: Secondary | ICD-10-CM | POA: Diagnosis present

## 2022-07-07 DIAGNOSIS — Z8521 Personal history of malignant neoplasm of larynx: Secondary | ICD-10-CM | POA: Insufficient documentation

## 2022-07-07 DIAGNOSIS — R55 Syncope and collapse: Secondary | ICD-10-CM | POA: Diagnosis present

## 2022-07-07 DIAGNOSIS — E785 Hyperlipidemia, unspecified: Secondary | ICD-10-CM | POA: Diagnosis present

## 2022-07-07 DIAGNOSIS — Z87891 Personal history of nicotine dependence: Secondary | ICD-10-CM | POA: Insufficient documentation

## 2022-07-07 DIAGNOSIS — E876 Hypokalemia: Secondary | ICD-10-CM | POA: Diagnosis not present

## 2022-07-07 DIAGNOSIS — R7989 Other specified abnormal findings of blood chemistry: Principal | ICD-10-CM | POA: Diagnosis present

## 2022-07-07 DIAGNOSIS — Z955 Presence of coronary angioplasty implant and graft: Secondary | ICD-10-CM | POA: Insufficient documentation

## 2022-07-07 DIAGNOSIS — I10 Essential (primary) hypertension: Secondary | ICD-10-CM | POA: Diagnosis present

## 2022-07-07 LAB — RESP PANEL BY RT-PCR (FLU A&B, COVID) ARPGX2
Influenza A by PCR: NEGATIVE
Influenza B by PCR: NEGATIVE
SARS Coronavirus 2 by RT PCR: NEGATIVE

## 2022-07-07 LAB — CBC WITH DIFFERENTIAL/PLATELET
Abs Immature Granulocytes: 0.06 10*3/uL (ref 0.00–0.07)
Basophils Absolute: 0 10*3/uL (ref 0.0–0.1)
Basophils Relative: 0 %
Eosinophils Absolute: 0 10*3/uL (ref 0.0–0.5)
Eosinophils Relative: 1 %
HCT: 44.4 % (ref 39.0–52.0)
Hemoglobin: 15.1 g/dL (ref 13.0–17.0)
Immature Granulocytes: 1 %
Lymphocytes Relative: 14 %
Lymphs Abs: 1.1 10*3/uL (ref 0.7–4.0)
MCH: 29.5 pg (ref 26.0–34.0)
MCHC: 34 g/dL (ref 30.0–36.0)
MCV: 86.7 fL (ref 80.0–100.0)
Monocytes Absolute: 0.7 10*3/uL (ref 0.1–1.0)
Monocytes Relative: 8 %
Neutro Abs: 6.3 10*3/uL (ref 1.7–7.7)
Neutrophils Relative %: 76 %
Platelets: 338 10*3/uL (ref 150–400)
RBC: 5.12 MIL/uL (ref 4.22–5.81)
RDW: 14.8 % (ref 11.5–15.5)
WBC: 8.2 10*3/uL (ref 4.0–10.5)
nRBC: 0 % (ref 0.0–0.2)

## 2022-07-07 LAB — URINALYSIS, ROUTINE W REFLEX MICROSCOPIC
Bilirubin Urine: NEGATIVE
Glucose, UA: NEGATIVE mg/dL
Hgb urine dipstick: NEGATIVE
Ketones, ur: NEGATIVE mg/dL
Leukocytes,Ua: NEGATIVE
Nitrite: NEGATIVE
Protein, ur: NEGATIVE mg/dL
Specific Gravity, Urine: 1.01 (ref 1.005–1.030)
pH: 8 (ref 5.0–8.0)

## 2022-07-07 LAB — BASIC METABOLIC PANEL
Anion gap: 10 (ref 5–15)
BUN: 20 mg/dL (ref 8–23)
CO2: 30 mmol/L (ref 22–32)
Calcium: 9.1 mg/dL (ref 8.9–10.3)
Chloride: 98 mmol/L (ref 98–111)
Creatinine, Ser: 0.77 mg/dL (ref 0.61–1.24)
GFR, Estimated: >60 mL/min (ref >60)
Glucose, Bld: 118 mg/dL — ABNORMAL HIGH (ref 70–99)
Potassium: 3.1 mmol/L — ABNORMAL LOW (ref 3.5–5.1)
Sodium: 138 mmol/L (ref 135–145)

## 2022-07-07 LAB — TROPONIN I (HIGH SENSITIVITY)
Troponin I (High Sensitivity): 64 ng/L — ABNORMAL HIGH (ref <18)
Troponin I (High Sensitivity): 65 ng/L — ABNORMAL HIGH (ref <18)
Troponin I (High Sensitivity): 60 ng/L — ABNORMAL HIGH (ref <18)
Troponin I (High Sensitivity): 58 ng/L — ABNORMAL HIGH (ref <18)

## 2022-07-07 MED ORDER — POTASSIUM CHLORIDE CRYS ER 10 MEQ PO TBCR
40.0000 meq | EXTENDED_RELEASE_TABLET | Freq: Two times a day (BID) | ORAL | Status: DC
  Administered 2022-07-07 – 2022-07-09 (×4): 40 meq via ORAL
  Filled 2022-07-07 (×4): qty 4

## 2022-07-07 MED ORDER — HYDRALAZINE HCL 25 MG PO TABS
25.0000 mg | ORAL_TABLET | Freq: Two times a day (BID) | ORAL | Status: DC
  Administered 2022-07-07 – 2022-07-09 (×4): 25 mg via ORAL
  Filled 2022-07-07 (×4): qty 1

## 2022-07-07 MED ORDER — ONDANSETRON HCL 4 MG/2ML IJ SOLN: 4.0000 mg | Freq: Four times a day (QID) | INTRAMUSCULAR | Status: DC | PRN

## 2022-07-07 MED ORDER — CROMOLYN SODIUM 5.2 MG/ACT NA AERS
1.0000 | INHALATION_SPRAY | Freq: Three times a day (TID) | NASAL | Status: DC
  Filled 2022-07-07: qty 26

## 2022-07-07 MED ORDER — LORAZEPAM 1 MG PO TABS
1.0000 mg | ORAL_TABLET | Freq: Every day | ORAL | Status: DC
  Administered 2022-07-07 – 2022-07-09 (×3): 1 mg via ORAL
  Filled 2022-07-07 (×3): qty 1

## 2022-07-07 MED ORDER — ONDANSETRON HCL 4 MG PO TABS: 4.0000 mg | ORAL_TABLET | Freq: Four times a day (QID) | ORAL | Status: DC | PRN

## 2022-07-07 MED ORDER — ETODOLAC 400 MG PO TABS
400.0000 mg | ORAL_TABLET | Freq: Two times a day (BID) | ORAL | Status: DC
  Filled 2022-07-07 (×4): qty 1

## 2022-07-07 MED ORDER — ATORVASTATIN CALCIUM 40 MG PO TABS
80.0000 mg | ORAL_TABLET | Freq: Every day | ORAL | Status: DC
  Administered 2022-07-07 – 2022-07-09 (×3): 80 mg via ORAL
  Filled 2022-07-07 (×3): qty 2

## 2022-07-07 MED ORDER — PANTOPRAZOLE SODIUM 40 MG PO TBEC
40.0000 mg | DELAYED_RELEASE_TABLET | Freq: Two times a day (BID) | ORAL | Status: DC
  Administered 2022-07-07 – 2022-07-10 (×6): 40 mg via ORAL
  Filled 2022-07-07 (×6): qty 1

## 2022-07-07 MED ORDER — ENOXAPARIN SODIUM 40 MG/0.4ML IJ SOSY
40.0000 mg | PREFILLED_SYRINGE | INTRAMUSCULAR | Status: DC
  Administered 2022-07-07 – 2022-07-09 (×3): 40 mg via SUBCUTANEOUS
  Filled 2022-07-07 (×3): qty 0.4

## 2022-07-07 MED ORDER — TAMSULOSIN HCL 0.4 MG PO CAPS
0.4000 mg | ORAL_CAPSULE | Freq: Every day | ORAL | Status: DC
  Administered 2022-07-07 – 2022-07-09 (×3): 0.4 mg via ORAL
  Filled 2022-07-07 (×3): qty 1

## 2022-07-07 MED ORDER — POLYVINYL ALCOHOL 1.4 % OP SOLN: 1.0000 [drp] | OPHTHALMIC | Status: DC | PRN

## 2022-07-07 MED ORDER — POTASSIUM CHLORIDE CRYS ER 20 MEQ PO TBCR
40.0000 meq | EXTENDED_RELEASE_TABLET | Freq: Once | ORAL | Status: AC
  Administered 2022-07-07: 40 meq via ORAL
  Filled 2022-07-07: qty 2

## 2022-07-07 MED ORDER — AMLODIPINE BESYLATE 5 MG PO TABS
10.0000 mg | ORAL_TABLET | Freq: Every day | ORAL | Status: DC
  Administered 2022-07-08 – 2022-07-10 (×3): 10 mg via ORAL
  Filled 2022-07-07 (×3): qty 2

## 2022-07-07 MED ORDER — DOCUSATE SODIUM 100 MG PO CAPS
100.0000 mg | ORAL_CAPSULE | Freq: Every day | ORAL | Status: DC
  Administered 2022-07-07 – 2022-07-10 (×4): 100 mg via ORAL
  Filled 2022-07-07 (×4): qty 1

## 2022-07-07 MED ORDER — SERTRALINE HCL 50 MG PO TABS
50.0000 mg | ORAL_TABLET | Freq: Every day | ORAL | Status: DC
  Administered 2022-07-08 – 2022-07-10 (×3): 50 mg via ORAL
  Filled 2022-07-07 (×3): qty 1

## 2022-07-07 MED ORDER — ASPIRIN 81 MG PO TBEC
81.0000 mg | DELAYED_RELEASE_TABLET | Freq: Every day | ORAL | Status: DC
  Administered 2022-07-07 – 2022-07-10 (×4): 81 mg via ORAL
  Filled 2022-07-07 (×4): qty 1

## 2022-07-07 MED ORDER — MONTELUKAST SODIUM 10 MG PO TABS
10.0000 mg | ORAL_TABLET | Freq: Every day | ORAL | Status: DC
  Administered 2022-07-07 – 2022-07-09 (×3): 10 mg via ORAL
  Filled 2022-07-07 (×3): qty 1

## 2022-07-07 NOTE — ED Notes (Signed)
Patient transported to CT 
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
## 2022-07-07 NOTE — ED Triage Notes (Addendum)
Patient states his legs became weak and he fell to floor landing on buttocks and lightly bumped head twice.  Denies LOC.  Patient lives at home alone uses a cane to ambulate Patient states he started to have dizziness last night when he went to bed and when he woke up this morning still feeling dizzy.  Patient reports at time of triage he doesn't feel dizzy at this time

## 2022-07-07 NOTE — ED Provider Notes (Signed)
[facility] EMERGENCY DEPARTMENT Provider Note   CSN: [id number] Arrival date & time: 07/07/22  6440     History {Add pertinent medical, surgical, social history, OB history to HPI:1} Chief Complaint  Patient presents with   Weakness   Near Syncope    DAYVEN LINSLEY is a 81 y.o. male.  Patient is an 81 year old male with h/o coronary artery disease, hyperlipidemia, chronic diastolic CHF, aortic valve regurgitation, aortic root dilation, OSApresenting for fall that occurred this morning.  Patient states he was walking when his legs went weak and he fell to the floor.  Admits to minor head trauma.  No blood thinner use.  No LOC.  States he tried to get up but then fell again.  Admits to recently recovering from " chest congestion".  He was diagnosed with COVID-19 virus last month and continued to have chest congestion and a nonproductive cough.  States he had a negative chest x-ray and blood work-up however was started on antibiotics just in case.  Patient otherwise denies any fevers, chills, nausea, vomiting, abdominal pain, or diarrhea.  Denies any bright red blood in stools.  Admits to black stools over the last week intermittently.  Denies previous history of GI bleed.  Denies any chest pain or shortness of breath.  Patient lives home alone and takes care of himself.  2 cardiac stents placed by Dr. Julianne Handler in 2014. Normal echocardiogram this May 2023.   The history is provided by the patient. No language interpreter was used.  Weakness Associated symptoms: near-syncope   Associated symptoms: no abdominal pain, no arthralgias, no chest pain, no cough, no dysuria, no fever, no seizures, no shortness of breath and no vomiting   Near Syncope Pertinent negatives include no chest pain, no abdominal pain and no shortness of breath.       Home Medications Prior to Admission medications   Medication Sig Start Date End Date Taking? Authorizing Provider  amLODipine (NORVASC) 10 MG  tablet Take 1 tablet (10 mg total) by mouth daily. 04/21/16   Theodis Blaze, MD  aspirin EC 81 MG tablet Take 1 tablet (81 mg total) by mouth daily. 04/18/13   Edmisten, Brooke O, PA-C  atorvastatin (LIPITOR) 80 MG tablet Take 80 mg by mouth daily.    [provider]  cromolyn (NASALCROM) 5.2 MG/ACT nasal spray Place 1 spray into both nostrils 3 (three) times daily.    [provider]  docusate sodium (COLACE) 100 MG capsule Take 100 mg by mouth 2 (two) times daily.    [provider]  etodolac (LODINE) 400 MG tablet Take 400 mg by mouth 2 (two) times daily.    [provider]  furosemide (LASIX) 20 MG tablet Take 2 tablets once daily and take an extra 1 tablet as needed in the afternoon for swelling 01/13/19   Lelon Perla, MD  furosemide (LASIX) 40 MG tablet Take 40 mg by mouth daily.    [provider]  hydrALAZINE (APRESOLINE) 50 MG tablet Take 25 mg by mouth 2 (two) times daily.    [provider]  hydrocortisone 2.5 % cream Apply topically 2 (two) times daily as needed. 02/17/22   [provider]  LORazepam (ATIVAN) 1 MG tablet Take 1 mg by mouth at bedtime.    [provider]  montelukast (SINGULAIR) 10 MG tablet Take 10 mg by mouth at bedtime.    [provider]  nitroGLYCERIN (NITROSTAT) 0.4 MG SL tablet Place 0.4 mg under the  tongue every 5 (five) minutes as needed for chest pain.    [provider]  pantoprazole (PROTONIX) 40 MG tablet Take 40 mg by mouth 2 (two) times daily.    [provider]  polyvinyl alcohol (LIQUIFILM TEARS) 1.4 % ophthalmic solution Place 1 drop into both eyes as needed for dry eyes.     [provider]  Potassium Chloride ER 20 MEQ TBCR Take 20 mEq by mouth 2 (two) times daily. 01/20/19   Lelon Perla, MD  tamsulosin (FLOMAX) 0.4 MG CAPS capsule Take 0.4 mg by mouth daily.     [provider]      Allergies    Patient has no known  allergies.    Review of Systems   Review of Systems  Constitutional:  Negative for chills and fever.  HENT:  Negative for ear pain and sore throat.   Eyes:  Negative for pain and visual disturbance.  Respiratory:  Negative for cough and shortness of breath.   Cardiovascular:  Positive for near-syncope. Negative for chest pain and palpitations.  Gastrointestinal:  Negative for abdominal pain and vomiting.  Genitourinary:  Negative for dysuria and hematuria.  Musculoskeletal:  Negative for arthralgias and back pain.  Skin:  Negative for color change and rash.  Neurological:  Positive for weakness. Negative for seizures and syncope.  All other systems reviewed and are negative.   Physical Exam Updated Vital Signs BP 128/67   Pulse (!) 56   Temp 98.4 F (36.9 C) (Oral)   Resp 15   Ht '5\' 5"'$  (1.651 m)   Wt 88.5 kg   SpO2 93%   BMI 32.45 kg/m  Physical Exam Vitals and nursing note reviewed.  Constitutional:      General: He is not in acute distress.    Appearance: He is well-developed.  HENT:     Head: Normocephalic and atraumatic.  Eyes:     General: Lids are normal. Vision grossly intact.     Conjunctiva/sclera: Conjunctivae normal.     Pupils: Pupils are equal, round, and reactive to light.  Cardiovascular:     Rate and Rhythm: Normal rate and regular rhythm.     Heart sounds: No murmur heard. Pulmonary:     Effort: Pulmonary effort is normal. No respiratory distress.     Breath sounds: Normal breath sounds.  Abdominal:     Palpations: Abdomen is soft.     Tenderness: There is no abdominal tenderness.  Musculoskeletal:        General: No swelling.     Cervical back: Neck supple. No bony tenderness.     Thoracic back: No bony tenderness.     Lumbar back: No bony tenderness.  Skin:    General: Skin is warm and dry.     Capillary Refill: Capillary refill takes less than 2 seconds.  Neurological:     General: No focal deficit present.     Mental Status: He is alert  and oriented to person, place, and time.     GCS: GCS eye subscore is 4. GCS verbal subscore is 5. GCS motor subscore is 6.     Cranial Nerves: Cranial nerves 2-12 are intact.     Sensory: Sensation is intact.     Motor: Motor function is intact.     Coordination: Coordination is intact.  Psychiatric:        Mood and Affect: Mood normal.     ED Results / Procedures / Treatments   Labs (all labs  ordered are listed, but only abnormal results are displayed) Labs Reviewed  RESP PANEL BY RT-PCR (FLU A&B, COVID) [id number]  CBC WITH DIFFERENTIAL/PLATELET  BASIC METABOLIC PANEL  TROPONIN I (HIGH SENSITIVITY)    EKG None  Radiology DG Chest Portable 1 View  Result Date: 07/07/2022 CLINICAL DATA:  Fall and dizziness. EXAM: PORTABLE CHEST 1 VIEW COMPARISON:  11/06/2019 chest radiograph and prior studies FINDINGS: The cardiomediastinal silhouette is unchanged. Mild peribronchial thickening is unchanged. There is no evidence of focal airspace disease, pulmonary edema, suspicious pulmonary nodule/mass, pleural effusion, or pneumothorax. No acute bony abnormalities are identified. IMPRESSION: No active disease. Electronically Signed   By: Margarette Canada M.D.   On: 07/07/2022 10:37    Procedures Procedures  {Document cardiac monitor, telemetry assessment procedure when appropriate:1}  Medications Ordered in ED Medications - No data to display  ED Course/ Medical Decision Making/ A&P                           Medical Decision Making Amount and/or Complexity of Data Reviewed Labs: ordered. Radiology: ordered.  Risk Prescription drug management.   39:109 AM 81 year old male presenting for fall that occurred this morning.   {Document critical care time when appropriate:1} {Document review of labs and clinical decision tools ie heart score, Chads2Vasc2 etc:1}  {Document your independent review of radiology images, and any outside records:1} {Document your discussion with family members,  caretakers, and with consultants:1} {Document social determinants of health affecting pt's care:1} {Document your decision making why or why not admission, treatments were needed:1} Final Clinical Impression(s) / ED Diagnoses Final diagnoses:  None    Rx / DC Orders ED Discharge Orders     None

## 2022-07-08 ENCOUNTER — Observation Stay (HOSPITAL_BASED_OUTPATIENT_CLINIC_OR_DEPARTMENT_OTHER): Payer: No Typology Code available for payment source

## 2022-07-08 DIAGNOSIS — R55 Syncope and collapse: Secondary | ICD-10-CM

## 2022-07-08 DIAGNOSIS — R778 Other specified abnormalities of plasma proteins: Secondary | ICD-10-CM | POA: Diagnosis not present

## 2022-07-08 DIAGNOSIS — R7989 Other specified abnormal findings of blood chemistry: Secondary | ICD-10-CM | POA: Diagnosis not present

## 2022-07-08 LAB — ECHOCARDIOGRAM COMPLETE
Calc EF: 47.8 %
Height: 65 in
P 1/2 time: 434 msec
Single Plane A2C EF: 45.8 %
Single Plane A4C EF: 46.8 %
Weight: 3120 oz

## 2022-07-08 LAB — BASIC METABOLIC PANEL
Anion gap: 6 (ref 5–15)
BUN: 17 mg/dL (ref 8–23)
CO2: 28 mmol/L (ref 22–32)
Calcium: 9 mg/dL (ref 8.9–10.3)
Chloride: 105 mmol/L (ref 98–111)
Creatinine, Ser: 0.72 mg/dL (ref 0.61–1.24)
GFR, Estimated: >60 mL/min (ref >60)
Glucose, Bld: 106 mg/dL — ABNORMAL HIGH (ref 70–99)
Potassium: 3.8 mmol/L (ref 3.5–5.1)
Sodium: 139 mmol/L (ref 135–145)

## 2022-07-08 MED ORDER — PERFLUTREN LIPID MICROSPHERE
1.0000 mL | INTRAVENOUS | Status: AC | PRN
  Administered 2022-07-08: 3 mL via INTRAVENOUS

## 2022-07-08 NOTE — Progress Notes (Signed)
PROGRESS NOTE    [name]  ZES:[id number] DOB: 1941/06/03 DOA: 07/07/2022 PCP: Glenda Chroman, MD   Brief Narrative:    [name] is a 81 y.o. male with medical history significant of coronary artery disease status post stenting in 2008, COPD, hypertension, aortic root dilation.  Patient experienced a fall at home earlier this morning. Noted to have some mild troponin elevation and some new diminished findings on echo.  Assessment & Plan:   Principal Problem:   Elevated troponin Active Problems:   CAD (coronary artery disease), history of PTCA only of LCX in 2008 and 50% stenosis of RCA at that time and 50% LAD,    HTN (hypertension)   Hyperlipemia   Gastro-esophageal reflux   Fall at home, initial encounter   Hypokalemia  Assessment and Plan:  Elevated troponin Delta troponin negative. Cardiology recommended observation with trending troponins. Will trend troponins and obtain echocardiogram with global hypokinesis and decreased LVEF compared to earlier this year Cardiology eval in am Fall at home PT with no recommendations Hypokalemia Resolved Coronary artery disease with history of stents and hypertension Can continue statin, antihypertensives, aspirin Hyperlipidemia   DVT prophylaxis:Lovenox Code Status: Full Family Communication: Family at bedside   Consultants:  None  Procedures:  None  Antimicrobials:  None   Subjective: Patient seen and evaluated today with no new acute complaints or concerns. No acute concerns or events noted overnight.  Objective: Vitals:   07/07/22 2317 07/08/22 0232 07/08/22 0637 07/08/22 1259  BP:  (!) 132/57 132/63 (!) 122/55  Pulse: 62 61 64 78  Resp: '16 17 18 15  '$ Temp:  97.7 F (36.5 C) 98 F (36.7 C) 97.9 F (36.6 C)  TempSrc:  Oral    SpO2: 96% 95% 96% 93%  Weight:      Height:        Intake/Output Summary (Last 24 hours) at 07/08/2022 1836 Last data filed at 07/08/2022 1300 Gross per 24 hour   Intake 660 ml  Output --  Net 660 ml   Filed Weights   07/07/22 1010  Weight: 88.5 kg    Examination:  General exam: Appears calm and comfortable  Respiratory system: Clear to auscultation. Respiratory effort normal. Cardiovascular system: S1 & S2 heard, RRR.  Gastrointestinal system: Abdomen is soft Central nervous system: Alert and awake Extremities: No edema Skin: No significant lesions noted Psychiatry: Flat affect.    Data Reviewed: I have personally reviewed following labs and imaging studies  CBC: Recent Labs  Lab 07/07/22 1024  WBC 8.2  NEUTROABS 6.3  HGB 15.1  HCT 44.4  MCV 86.7  PLT 263   Basic Metabolic Panel: Recent Labs  Lab 07/07/22 1024 07/08/22 0631  NA 138 139  K 3.1* 3.8  CL 98 105  CO2 30 28  GLUCOSE 118* 106*  BUN 20 17  CREATININE 0.77 0.72  CALCIUM 9.1 9.0   GFR: Estimated Creatinine Clearance: 71.5 mL/min (by C-G formula based on SCr of 0.72 mg/dL). Liver Function Tests: No results for input(s): "AST", "ALT", "ALKPHOS", "BILITOT", "PROT", "ALBUMIN" in the last 168 hours. No results for input(s): "LIPASE", "AMYLASE" in the last 168 hours. No results for input(s): "AMMONIA" in the last 168 hours. Coagulation Profile: No results for input(s): "INR", "PROTIME" in the last 168 hours. Cardiac Enzymes: No results for input(s): "CKTOTAL", "CKMB", "CKMBINDEX", "TROPONINI" in the last 168 hours. BNP (last 3 results) No results for input(s): "PROBNP" in the last 8760 hours. HbA1C: No results for input(s): "  HGBA1C" in the last 72 hours. CBG: No results for input(s): "GLUCAP" in the last 168 hours. Lipid Profile: No results for input(s): "CHOL", "HDL", "LDLCALC", "TRIG", "CHOLHDL", "LDLDIRECT" in the last 72 hours. Thyroid Function Tests: No results for input(s): "TSH", "T4TOTAL", "FREET4", "T3FREE", "THYROIDAB" in the last 72 hours. Anemia Panel: No results for input(s): "VITAMINB12", "FOLATE", "FERRITIN", "TIBC", "IRON", "RETICCTPCT"  in the last 72 hours. Sepsis Labs: No results for input(s): "PROCALCITON", "LATICACIDVEN" in the last 168 hours.  Recent Results (from the past 240 hour(s))  Resp Panel by RT-PCR (Flu A&B, Covid) Anterior Nasal Swab     Status: None   Collection Time: 07/07/22 10:53 AM   Specimen: Anterior Nasal Swab  Result Value Ref Range Status   SARS Coronavirus 2 by RT PCR NEGATIVE NEGATIVE Final    Comment: (NOTE) SARS-CoV-2 target nucleic acids are NOT DETECTED.  The SARS-CoV-2 RNA is generally detectable in upper respiratory specimens during the acute phase of infection. The lowest concentration of SARS-CoV-2 viral copies this assay can detect is 138 copies/mL. A negative result does not preclude SARS-Cov-2 infection and should not be used as the sole basis for treatment or other patient management decisions. A negative result may occur with  improper specimen collection/handling, submission of specimen other than nasopharyngeal swab, presence of viral mutation(s) within the areas targeted by this assay, and inadequate number of viral copies(<138 copies/mL). A negative result must be combined with clinical observations, patient history, and epidemiological information. The expected result is Negative.  Fact Sheet for Patients:  EntrepreneurPulse.com.au  Fact Sheet for Healthcare Providers:  IncredibleEmployment.be  This test is no t yet approved or cleared by the [place] FDA and  has been authorized for detection and/or diagnosis of SARS-CoV-2 by FDA under an Emergency Use Authorization (EUA). This EUA will remain  in effect (meaning this test can be used) for the duration of the COVID-19 declaration under Section 564(b)(1) of the Act, 21 U.S.C.section 360bbb-3(b)(1), unless the authorization is terminated  or revoked sooner.       Influenza A by PCR NEGATIVE NEGATIVE Final   Influenza B by PCR NEGATIVE NEGATIVE Final    Comment: (NOTE) The  Xpert Xpress SARS-CoV-2/FLU/RSV plus assay is intended as an aid in the diagnosis of influenza from Nasopharyngeal swab specimens and should not be used as a sole basis for treatment. Nasal washings and aspirates are unacceptable for Xpert Xpress SARS-CoV-2/FLU/RSV testing.  Fact Sheet for Patients: EntrepreneurPulse.com.au  Fact Sheet for Healthcare Providers: IncredibleEmployment.be  This test is not yet approved or cleared by the [place] FDA and has been authorized for detection and/or diagnosis of SARS-CoV-2 by FDA under an Emergency Use Authorization (EUA). This EUA will remain in effect (meaning this test can be used) for the duration of the COVID-19 declaration under Section 564(b)(1) of the Act, 21 U.S.C. section 360bbb-3(b)(1), unless the authorization is terminated or revoked.  Performed at [facility], [address], [place], [place] [address]          Radiology Studies: ECHOCARDIOGRAM COMPLETE  Result Date: 07/08/2022    ECHOCARDIOGRAM REPORT   Patient Name:   [name] Date of Exam: 07/08/2022 Medical Rec #:  [id number]       Height:       65.0 in Accession #:    [id number]      Weight:       195.0 lb Date of Birth:  03-14-1941       BSA:  1.957 m Patient Age:    25 years        BP:           122/55 mmHg Patient Gender: M               HR:           78 bpm. Exam Location:  Inpatient Procedure: 2D Echo, Cardiac Doppler, Color Doppler and Intracardiac            Opacification Agent Indications:    Elevated Troponin  History:        Patient has prior history of Echocardiogram examinations, most                 recent 02/09/2022. CAD, COPD; Risk Factors:Hypertension, Sleep                 Apnea and Dyslipidemia. AAA. Recent fall. Recent history of                 COVID.  Sonographer:    Darlina Sicilian RDCS Referring Phys: [phone] JACOB J STINSON  Sonographer Comments: Suboptimal parasternal window and suboptimal apical window.  IMPRESSIONS  1. Left ventricular ejection fraction, by estimation, is 45 to 50%. The left ventricle has mildly decreased function. The left ventricle demonstrates global hypokinesis. Left ventricular diastolic function could not be evaluated.  2. Right ventricular systolic function was not well visualized. The right ventricular size is normal. Tricuspid regurgitation signal is inadequate for assessing PA pressure.  3. The mitral valve is normal in structure. Trivial mitral valve regurgitation. No evidence of mitral stenosis.  4. The aortic valve was not well visualized. Aortic valve regurgitation is mild to moderate. No aortic stenosis is present. Aortic regurgitation PHT measures 434 msec.  5. The inferior vena cava is normal in size with greater than 50% respiratory variability, suggesting right atrial pressure of 3 mmHg. FINDINGS  Left Ventricle: Left ventricular ejection fraction, by estimation, is 45 to 50%. The left ventricle has mildly decreased function. The left ventricle demonstrates global hypokinesis. Definity contrast agent was given IV to delineate the left ventricular  endocardial borders. The left ventricular internal cavity size was normal in size. There is no left ventricular hypertrophy. Left ventricular diastolic function could not be evaluated. Right Ventricle: The right ventricular size is normal. No increase in right ventricular wall thickness. Right ventricular systolic function was not well visualized. Tricuspid regurgitation signal is inadequate for assessing PA pressure. Left Atrium: Left atrial size was normal in size. Right Atrium: Right atrial size was not well visualized. Pericardium: There is no evidence of pericardial effusion. Mitral Valve: The mitral valve is normal in structure. Trivial mitral valve regurgitation. No evidence of mitral valve stenosis. Tricuspid Valve: The tricuspid valve is normal in structure. Tricuspid valve regurgitation is not demonstrated. No evidence of  tricuspid stenosis. Aortic Valve: The aortic valve was not well visualized. Aortic valve regurgitation is mild to moderate. Aortic regurgitation PHT measures 434 msec. No aortic stenosis is present. Pulmonic Valve: The pulmonic valve was normal in structure. Pulmonic valve regurgitation is not visualized. No evidence of pulmonic stenosis. Aorta: The aortic root is normal in size and structure. Venous: The inferior vena cava is normal in size with greater than 50% respiratory variability, suggesting right atrial pressure of 3 mmHg. IAS/Shunts: No atrial level shunt detected by color flow Doppler.  LEFT VENTRICLE PLAX 2D LVOT diam:     2.30 cm LV SV:  70 LV SV Index:   36 LVOT Area:     4.15 cm  LV Volumes (MOD) LV vol d, MOD A2C: 158.0 ml LV vol d, MOD A4C: 165.0 ml LV vol s, MOD A2C: 85.7 ml LV vol s, MOD A4C: 87.8 ml LV SV MOD A2C:     72.3 ml LV SV MOD A4C:     165.0 ml LV SV MOD BP:      80.0 ml RIGHT VENTRICLE RV S prime:     9.25 cm/s LEFT ATRIUM             Index LA diam:        3.90 cm 1.99 cm/m LA Vol (A2C):   41.3 ml 21.10 ml/m LA Vol (A4C):   36.2 ml 18.50 ml/m LA Biplane Vol: 41.5 ml 21.21 ml/m  AORTIC VALVE LVOT Vmax:   90.20 cm/s LVOT Vmean:  58.700 cm/s LVOT VTI:    0.168 m AI PHT:      434 msec  AORTA Ao Root diam: 3.50 cm  SHUNTS Systemic VTI:  0.17 m Systemic Diam: 2.30 cm Fransico Him MD Electronically signed by Fransico Him MD Signature Date/Time: 07/08/2022/4:00:28 PM    Final    CT Head Wo Contrast  Result Date: 07/07/2022 CLINICAL DATA:  81 year old male with history of trauma from a fall. EXAM: CT HEAD WITHOUT CONTRAST CT CERVICAL SPINE WITHOUT CONTRAST TECHNIQUE: Multidetector CT imaging of the head and cervical spine was performed following the standard protocol without intravenous contrast. Multiplanar CT image reconstructions of the cervical spine were also generated. RADIATION DOSE REDUCTION: This exam was performed according to the departmental dose-optimization program  which includes automated exposure control, adjustment of the mA and/or kV according to patient size and/or use of iterative reconstruction technique. COMPARISON:  Neck CT 11/06/2019. FINDINGS: CT HEAD FINDINGS Brain: Mild cerebral atrophy. Patchy and confluent areas of decreased attenuation are noted throughout the deep and periventricular white matter of the cerebral hemispheres bilaterally, compatible with chronic microvascular ischemic disease. No evidence of acute infarction, hemorrhage, hydrocephalus, extra-axial collection or mass lesion/mass effect. Vascular: No hyperdense vessel or unexpected calcification. Skull: Normal. Negative for fracture or focal lesion. Chronic posttraumatic deformity of the nasal arch status post reconstruction, similar to the prior examination. Sinuses/Orbits: Low-attenuation air-fluid level in the left maxillary sinus, new compared to the prior examination. Other: None. CT CERVICAL SPINE FINDINGS Alignment: Normal. Skull base and vertebrae: No acute fracture. No primary bone lesion or focal pathologic process. Soft tissues and spinal canal: No prevertebral fluid or swelling. No visible canal hematoma. Disc levels: Multilevel degenerative disc disease, most severe at C3-C4, C4-C5 and C6-C7. Moderate multilevel facet arthropathy bilaterally. Upper chest: Centrilobular and paraseptal emphysema noted in the apices of the lungs. Other: None. IMPRESSION: 1. No evidence of significant acute traumatic injury to the skull, brain or cervical spine. 2. Mild cerebral atrophy with mild chronic microvascular ischemic changes in the cerebral white matter, as above. 3. Multilevel degenerative disc disease and cervical spondylosis, as above. 4. New low-attenuation air-fluid level in the left maxillary sinus. Clinical correlation for signs and symptoms of sinusitis is recommended. Electronically Signed   By: Vinnie Langton M.D.   On: 07/07/2022 11:35   CT Cervical Spine Wo Contrast  Result  Date: 07/07/2022 CLINICAL DATA:  81 year old male with history of trauma from a fall. EXAM: CT HEAD WITHOUT CONTRAST CT CERVICAL SPINE WITHOUT CONTRAST TECHNIQUE: Multidetector CT imaging of the head and cervical spine was performed following the standard protocol  without intravenous contrast. Multiplanar CT image reconstructions of the cervical spine were also generated. RADIATION DOSE REDUCTION: This exam was performed according to the departmental dose-optimization program which includes automated exposure control, adjustment of the mA and/or kV according to patient size and/or use of iterative reconstruction technique. COMPARISON:  Neck CT 11/06/2019. FINDINGS: CT HEAD FINDINGS Brain: Mild cerebral atrophy. Patchy and confluent areas of decreased attenuation are noted throughout the deep and periventricular white matter of the cerebral hemispheres bilaterally, compatible with chronic microvascular ischemic disease. No evidence of acute infarction, hemorrhage, hydrocephalus, extra-axial collection or mass lesion/mass effect. Vascular: No hyperdense vessel or unexpected calcification. Skull: Normal. Negative for fracture or focal lesion. Chronic posttraumatic deformity of the nasal arch status post reconstruction, similar to the prior examination. Sinuses/Orbits: Low-attenuation air-fluid level in the left maxillary sinus, new compared to the prior examination. Other: None. CT CERVICAL SPINE FINDINGS Alignment: Normal. Skull base and vertebrae: No acute fracture. No primary bone lesion or focal pathologic process. Soft tissues and spinal canal: No prevertebral fluid or swelling. No visible canal hematoma. Disc levels: Multilevel degenerative disc disease, most severe at C3-C4, C4-C5 and C6-C7. Moderate multilevel facet arthropathy bilaterally. Upper chest: Centrilobular and paraseptal emphysema noted in the apices of the lungs. Other: None. IMPRESSION: 1. No evidence of significant acute traumatic injury to the  skull, brain or cervical spine. 2. Mild cerebral atrophy with mild chronic microvascular ischemic changes in the cerebral white matter, as above. 3. Multilevel degenerative disc disease and cervical spondylosis, as above. 4. New low-attenuation air-fluid level in the left maxillary sinus. Clinical correlation for signs and symptoms of sinusitis is recommended. Electronically Signed   By: Vinnie Langton M.D.   On: 07/07/2022 11:35   DG Chest Portable 1 View  Result Date: 07/07/2022 CLINICAL DATA:  Fall and dizziness. EXAM: PORTABLE CHEST 1 VIEW COMPARISON:  11/06/2019 chest radiograph and prior studies FINDINGS: The cardiomediastinal silhouette is unchanged. Mild peribronchial thickening is unchanged. There is no evidence of focal airspace disease, pulmonary edema, suspicious pulmonary nodule/mass, pleural effusion, or pneumothorax. No acute bony abnormalities are identified. IMPRESSION: No active disease. Electronically Signed   By: Margarette Canada M.D.   On: 07/07/2022 10:37        Scheduled Meds:  amLODipine  10 mg Oral Daily   aspirin EC  81 mg Oral Daily   atorvastatin  80 mg Oral q1800   docusate sodium  100 mg Oral Daily   enoxaparin (LOVENOX) injection  40 mg Subcutaneous Q24H   hydrALAZINE  25 mg Oral BID   LORazepam  1 mg Oral QHS   montelukast  10 mg Oral QHS   pantoprazole  40 mg Oral BID   potassium chloride  40 mEq Oral BID   sertraline  50 mg Oral Daily   tamsulosin  0.4 mg Oral QHS     LOS: 0 days    Time spent: 35 minutes    Vanissa Strength Darleen Crocker, DO [facility]  If 7PM-7AM, please contact night-coverage [email or web address] 07/08/2022, 6:36 PM

## 2022-07-08 NOTE — Progress Notes (Signed)
Family will bring in medications etodolac and cromolyn.

## 2022-07-08 NOTE — Evaluation (Signed)
Physical Therapy Evaluation Patient Details Name: [name] MRN: [id number] DOB: May 12, 1941 Today's Date: 07/08/2022  History of Present Illness  Per Dr. Nehemiah Settle:   [name] is a 81 y.o. male with medical history significant of coronary artery disease status post stenting in 2008, COPD, hypertension, aortic root dilation.  Patient experienced a fall at home earlier this morning.  He fell to his knees and hit his back.  He did not hit his head.  When he tried to get up, he fell again.  Again he did not hit his head.  He does not feel dizzy or lightheaded.  He feels mildly weak.  He denies chest pain, shortness of breath, diaphoresis, fevers, chills.  Clinical Impression  Pt functioning at previous level not skilled PT needed.        Recommendations for follow up therapy are one component of a multi-disciplinary discharge planning process, led by the attending physician.  Recommendations may be updated based on patient status, additional functional criteria and insurance authorization.  Functional Status Assessment       Precautions / Restrictions Precautions Precautions: none Restrictions Weight Bearing Restrictions: No      Mobility  Bed Mobility Overal bed mobility: Independent                  Transfers Overall transfer level: Independent                      Ambulation/Gait Ambulation/Gait assistance: Modified independent (Device/Increase time) Gait Distance (Feet): 150 Feet Assistive device: Straight cane Gait Pattern/deviations: WFL(Within Functional Limits)   Gait velocity interpretation: 1.31 - 2.62 ft/sec, indicative of limited community ambulator        Balance  good                                           Pertinent Vitals/Pain Pain Assessment Pain Assessment: No/denies pain    Home Living Family/patient expects to be discharged to:: Private residence   Available Help at Discharge: Family Type of Home:  House Home Access: Stairs to enter Entrance Stairs-Rails: Right;Left;Can reach both Technical brewer of Steps: 5   Home Layout: One level Home Equipment: Cane - single point;Transport planner;      Prior Function Prior Level of Function : Independent/Modified Independent                        Extremity/Trunk Assessment        Lower Extremity Assessment Lower Extremity Assessment: Overall WFL for tasks assessed       Communication   Communication: No difficulties  Cognition Arousal/Alertness: Awake/alert   Overall Cognitive Status: Within Functional Limits for tasks assessed                                                 Assessment/Plan    PT Assessment Patient does not need any further PT services  PT Problem List         PT Treatment Interventions   Eval only     Frequency          AM-PAC PT "6 Clicks" Mobility  Outcome Measure Help needed turning from your back to your side while in a  flat bed without using bedrails?: None Help needed moving from lying on your back to sitting on the side of a flat bed without using bedrails?: None Help needed moving to and from a bed to a chair (including a wheelchair)?: None Help needed standing up from a chair using your arms (e.g., wheelchair or bedside chair)?: None Help needed to walk in hospital room?: None Help needed climbing 3-5 steps with a railing? : None 6 Click Score: 24    End of Session Equipment Utilized During Treatment: Gait belt Activity Tolerance: Patient tolerated treatment well Patient left: in bed Nurse Communication: Mobility status PT Visit Diagnosis: Unsteadiness on feet (R26.81)    Time: 1100-1114 PT Time Calculation (min) (ACUTE ONLY): 14 min   Charges:  Evaluation             Rayetta Humphrey, PT CLT [phone] 07/08/2022, 11:17 AM

## 2022-07-08 NOTE — TOC Initial Note (Signed)
Transition of Care [facility]) - Initial/Assessment Note    Patient Details  Name: [name] MRN: [id number] Date of Birth: 15-Aug-1941  Transition of Care [facility]) CM/SW Contact:    Boneta Lucks, RN Phone Number: 07/08/2022, 1:06 PM  Clinical Narrative:     Patient admitted with elevated troponin. PT had no recommendation. Patient has a cane and lives alone. Stays he has lots of support and not needs. His daughter called [facility] to do notification.              Expected Discharge Plan: Home/Self Care Barriers to Discharge: No Barriers Identified   Patient Goals and CMS Choice Patient states their goals for this hospitalization and ongoing recovery are:: to go home. CMS Medicare.gov Compare Post Acute Care list provided to:: Patient Choice offered to / list presented to : Patient  Expected Discharge Plan and Services Expected Discharge Plan: Home/Self Care       Living arrangements for the past 2 months: Single Family Home           Prior Living Arrangements/Services Living arrangements for the past 2 months: Single Family Home Lives with:: Self   Activities of Daily Living Home Assistive Devices/Equipment: None ADL Screening (condition at time of admission) Patient's cognitive ability adequate to safely complete daily activities?: Yes Is the patient deaf or have difficulty hearing?: No Does the patient have difficulty seeing, even when wearing glasses/contacts?: No Does the patient have difficulty concentrating, remembering, or making decisions?: No Patient able to express need for assistance with ADLs?: Yes Does the patient have difficulty dressing or bathing?: No Independently performs ADLs?: Yes (appropriate for developmental age) Does the patient have difficulty walking or climbing stairs?: No Weakness of Legs: None Weakness of Arms/Hands: None     Admission diagnosis:  Hypokalemia [E87.6] Elevated troponin [R79.89] Patient Active Problem List   Diagnosis Date  Noted   Elevated troponin 07/07/2022   Fall at home, initial encounter 07/07/2022   Hypokalemia 07/07/2022   Colon adenoma 07/23/2018   Esophageal dysphagia    Gastro-esophageal reflux 10/30/2017   Pain in the chest    Lower extremity edema 04/20/2016   Insomnia 04/20/2016   BPH (benign prostatic hyperplasia) 04/20/2016   Pancreatitis 04/29/2014   Common bile duct dilatation 04/29/2014   Progressive angina ([place]) 09/10/2013   Intermediate coronary syndrome ([place]) 09/10/2013   CAD (coronary artery disease), history of PTCA only of LCX in 2008 and 50% stenosis of RCA at that time and 50% LAD,  11/04/2011   HTN (hypertension) 11/04/2011   Hyperlipemia 11/04/2011   Bradycardia, drug induced 11/04/2011   H/O laryngeal cancer, treated with radiation therapy 11/04/2011   Aortic root dilatation, history of in 2008 11/04/2011   PCP:  Glenda Chroman, MD Pharmacy:   [facility] [address] [place] - [address] [place] [address] Phone: [phone] Fax: [place], [place] [address] Phone: [phone] Fax: [phone]

## 2022-07-08 NOTE — Care Management Obs Status (Signed)
[place] NOTIFICATION   Patient Details  Name: ARPAN ESKELSON MRN: [id number] Date of Birth: 1941/03/02   Medicare Observation Status Notification Given:  Yes    Boneta Lucks, RN 07/08/2022, 3:24 PM

## 2022-07-09 ENCOUNTER — Observation Stay (HOSPITAL_BASED_OUTPATIENT_CLINIC_OR_DEPARTMENT_OTHER): Payer: No Typology Code available for payment source

## 2022-07-09 DIAGNOSIS — I428 Other cardiomyopathies: Secondary | ICD-10-CM | POA: Diagnosis not present

## 2022-07-09 DIAGNOSIS — I2510 Atherosclerotic heart disease of native coronary artery without angina pectoris: Secondary | ICD-10-CM | POA: Diagnosis not present

## 2022-07-09 DIAGNOSIS — R7989 Other specified abnormal findings of blood chemistry: Secondary | ICD-10-CM | POA: Diagnosis not present

## 2022-07-09 DIAGNOSIS — I10 Essential (primary) hypertension: Secondary | ICD-10-CM

## 2022-07-09 LAB — ECHOCARDIOGRAM LIMITED
Calc EF: 51 %
Height: 65 in
Single Plane A2C EF: 52.6 %
Single Plane A4C EF: 48.8 %
Weight: 3120 oz

## 2022-07-09 MED ORDER — FUROSEMIDE 40 MG PO TABS
40.0000 mg | ORAL_TABLET | Freq: Every day | ORAL | Status: DC
  Administered 2022-07-09 – 2022-07-10 (×2): 40 mg via ORAL
  Filled 2022-07-09 (×2): qty 1

## 2022-07-09 MED ORDER — PERFLUTREN LIPID MICROSPHERE
1.0000 mL | INTRAVENOUS | Status: AC | PRN
  Administered 2022-07-09: 2 mL via INTRAVENOUS

## 2022-07-09 MED ORDER — LOSARTAN POTASSIUM 50 MG PO TABS
25.0000 mg | ORAL_TABLET | Freq: Every day | ORAL | Status: DC
  Administered 2022-07-09 – 2022-07-10 (×2): 25 mg via ORAL
  Filled 2022-07-09 (×2): qty 1

## 2022-07-09 MED ORDER — LIDOCAINE 5 % EX PTCH
1.0000 | MEDICATED_PATCH | CUTANEOUS | Status: DC
  Administered 2022-07-10: 1 via TRANSDERMAL
  Filled 2022-07-09 (×2): qty 1

## 2022-07-09 NOTE — Progress Notes (Signed)
  Echocardiogram 2D Echocardiogram has been performed.  [name] 07/09/2022, 2:32 PM
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
## 2022-07-09 NOTE — Progress Notes (Signed)
PROGRESS NOTE    [name]  TDV:[id number] DOB: 1941-04-29 DOA: 07/07/2022 PCP: Glenda Chroman, MD   Brief Narrative:    [name] is a 81 y.o. male with medical history significant of coronary artery disease status post stenting in 2008, COPD, hypertension, aortic root dilation.  Patient experienced a fall at home earlier this morning. Noted to have some mild troponin elevation and some new diminished findings on echo.  Assessment & Plan:   Principal Problem:   Elevated troponin Active Problems:   CAD (coronary artery disease), history of PTCA only of LCX in 2008 and 50% stenosis of RCA at that time and 50% LAD,    HTN (hypertension)   Hyperlipemia   Gastro-esophageal reflux   Fall at home, initial encounter   Hypokalemia  Assessment and Plan:   Elevated troponin Delta troponin negative. Cardiology recommended observation with trending troponins. 2D echocardiogram with mildly diminished LVEF, cardiology has adjusted hypertension medications and recommends further follow-up in a.m. to ensure that medications are adequate prior to discharge Fall at home PT with no recommendations Hypokalemia Resolved Coronary artery disease with history of stents and hypertension Can continue statin, antihypertensives, aspirin Hyperlipidemia   DVT prophylaxis:Lovenox Code Status: Full Family Communication: Son at bedside 10/9     Consultants:  Cardiology   Procedures:  Echo as noted below   Antimicrobials:  None   Subjective: Patient seen and evaluated today with no new acute complaints or concerns. No acute concerns or events noted overnight.  Objective: Vitals:   07/08/22 2136 07/08/22 2328 07/09/22 0500 07/09/22 1440  BP: (!) 143/69  (!) 142/69 (!) 106/54  Pulse: 75 71 72 62  Resp: '19 16 18   '$ Temp: 98.1 F (36.7 C)  97.7 F (36.5 C) 98.3 F (36.8 C)  TempSrc: Oral  Oral Oral  SpO2: 95% 96% 96% 95%  Weight:      Height:        Intake/Output Summary  (Last 24 hours) at 07/09/2022 1709 Last data filed at 07/09/2022 1200 Gross per 24 hour  Intake 720 ml  Output --  Net 720 ml   Filed Weights   07/07/22 1010  Weight: 88.5 kg    Examination:  General exam: Appears calm and comfortable  Respiratory system: Clear to auscultation. Respiratory effort normal. Cardiovascular system: S1 & S2 heard, RRR.  Gastrointestinal system: Abdomen is soft Central nervous system: Alert and awake Extremities: No edema Skin: No significant lesions noted Psychiatry: Flat affect.    Data Reviewed: I have personally reviewed following labs and imaging studies  CBC: Recent Labs  Lab 07/07/22 1024  WBC 8.2  NEUTROABS 6.3  HGB 15.1  HCT 44.4  MCV 86.7  PLT 062   Basic Metabolic Panel: Recent Labs  Lab 07/07/22 1024 07/08/22 0631  NA 138 139  K 3.1* 3.8  CL 98 105  CO2 30 28  GLUCOSE 118* 106*  BUN 20 17  CREATININE 0.77 0.72  CALCIUM 9.1 9.0   GFR: Estimated Creatinine Clearance: 71.5 mL/min (by C-G formula based on SCr of 0.72 mg/dL). Liver Function Tests: No results for input(s): "AST", "ALT", "ALKPHOS", "BILITOT", "PROT", "ALBUMIN" in the last 168 hours. No results for input(s): "LIPASE", "AMYLASE" in the last 168 hours. No results for input(s): "AMMONIA" in the last 168 hours. Coagulation Profile: No results for input(s): "INR", "PROTIME" in the last 168 hours. Cardiac Enzymes: No results for input(s): "CKTOTAL", "CKMB", "CKMBINDEX", "TROPONINI" in the last 168 hours. BNP (last 3 results)  No results for input(s): "PROBNP" in the last 8760 hours. HbA1C: No results for input(s): "HGBA1C" in the last 72 hours. CBG: No results for input(s): "GLUCAP" in the last 168 hours. Lipid Profile: No results for input(s): "CHOL", "HDL", "LDLCALC", "TRIG", "CHOLHDL", "LDLDIRECT" in the last 72 hours. Thyroid Function Tests: No results for input(s): "TSH", "T4TOTAL", "FREET4", "T3FREE", "THYROIDAB" in the last 72 hours. Anemia  Panel: No results for input(s): "VITAMINB12", "FOLATE", "FERRITIN", "TIBC", "IRON", "RETICCTPCT" in the last 72 hours. Sepsis Labs: No results for input(s): "PROCALCITON", "LATICACIDVEN" in the last 168 hours.  Recent Results (from the past 240 hour(s))  Resp Panel by RT-PCR (Flu A&B, Covid) Anterior Nasal Swab     Status: None   Collection Time: 07/07/22 10:53 AM   Specimen: Anterior Nasal Swab  Result Value Ref Range Status   SARS Coronavirus 2 by RT PCR NEGATIVE NEGATIVE Final    Comment: (NOTE) SARS-CoV-2 target nucleic acids are NOT DETECTED.  The SARS-CoV-2 RNA is generally detectable in upper respiratory specimens during the acute phase of infection. The lowest concentration of SARS-CoV-2 viral copies this assay can detect is 138 copies/mL. A negative result does not preclude SARS-Cov-2 infection and should not be used as the sole basis for treatment or other patient management decisions. A negative result may occur with  improper specimen collection/handling, submission of specimen other than nasopharyngeal swab, presence of viral mutation(s) within the areas targeted by this assay, and inadequate number of viral copies(<138 copies/mL). A negative result must be combined with clinical observations, patient history, and epidemiological information. The expected result is Negative.  Fact Sheet for Patients:  EntrepreneurPulse.com.au  Fact Sheet for Healthcare Providers:  IncredibleEmployment.be  This test is no t yet approved or cleared by the [place] FDA and  has been authorized for detection and/or diagnosis of SARS-CoV-2 by FDA under an Emergency Use Authorization (EUA). This EUA will remain  in effect (meaning this test can be used) for the duration of the COVID-19 declaration under Section 564(b)(1) of the Act, 21 U.S.C.section 360bbb-3(b)(1), unless the authorization is terminated  or revoked sooner.       Influenza A by  PCR NEGATIVE NEGATIVE Final   Influenza B by PCR NEGATIVE NEGATIVE Final    Comment: (NOTE) The Xpert Xpress SARS-CoV-2/FLU/RSV plus assay is intended as an aid in the diagnosis of influenza from Nasopharyngeal swab specimens and should not be used as a sole basis for treatment. Nasal washings and aspirates are unacceptable for Xpert Xpress SARS-CoV-2/FLU/RSV testing.  Fact Sheet for Patients: EntrepreneurPulse.com.au  Fact Sheet for Healthcare Providers: IncredibleEmployment.be  This test is not yet approved or cleared by the [place] FDA and has been authorized for detection and/or diagnosis of SARS-CoV-2 by FDA under an Emergency Use Authorization (EUA). This EUA will remain in effect (meaning this test can be used) for the duration of the COVID-19 declaration under Section 564(b)(1) of the Act, 21 U.S.C. section 360bbb-3(b)(1), unless the authorization is terminated or revoked.  Performed at [facility], [address] [place], [place] [address]          Radiology Studies: ECHOCARDIOGRAM LIMITED  Result Date: 07/09/2022    ECHOCARDIOGRAM LIMITED REPORT   Patient Name:   [name] Date of Exam: 07/09/2022 Medical Rec #:  [id number]       Height:       65.0 in Accession #:    [id number]      Weight:       195.0 lb Date  of Birth:  Aug 25, 1941       BSA:          1.957 m Patient Age:    55 years        BP:           142/69 mmHg Patient Gender: M               HR:           59 bpm. Exam Location:  [facility] Procedure: Limited Echo, Cardiac Doppler, Color Doppler and Intracardiac            Opacification Agent Indications:     I42.9 Cardiomyopathy (unspecified)  History:         Patient has prior history of Echocardiogram examinations, most                  recent 07/08/2022. CAD, Abnormal ECG, Arrythmias:Bradycardia,                  Signs/Symptoms:Chest Pain; Risk Factors:Hypertension and Sleep                  Apnea. Cancer.  Sonographer:      Roseanna Rainbow RDCS Referring Phys:  [id number] TRACI R Legacy Diagnosing Phys: Fransico Him MD  Sonographer Comments: Technically difficult study due to poor echo windows. IMPRESSIONS  1. Right ventricular systolic function was not well visualized. The right ventricular size is normal. Tricuspid regurgitation signal is inadequate for assessing PA pressure.  2. Left ventricular ejection fraction, by estimation, is 45 to 50%. The left ventricle has mildly decreased function. The left ventricle has no regional wall motion abnormalities.  3. The mitral valve is normal in structure.  4. The aortic valve is tricuspid. Aortic valve sclerosis is present, with no evidence of aortic valve stenosis.  5. Aortic dilatation noted. There is borderline dilatation of the ascending aorta, measuring 37 mm.  6. The inferior vena cava is normal in size with greater than 50% respiratory variability, suggesting right atrial pressure of 3 mmHg. FINDINGS  Left Ventricle: Left ventricular ejection fraction, by estimation, is 45 to 50%. The left ventricle has mildly decreased function. The left ventricle has no regional wall motion abnormalities. Definity contrast agent was given IV to delineate the left ventricular endocardial borders. The left ventricular internal cavity size was normal in size. There is no left ventricular hypertrophy. Right Ventricle: The right ventricular size is normal. No increase in right ventricular wall thickness. Right ventricular systolic function was not well visualized. Tricuspid regurgitation signal is inadequate for assessing PA pressure. Left Atrium: Left atrial size was normal in size. Right Atrium: Right atrial size was normal in size. Pericardium: There is no evidence of pericardial effusion. Mitral Valve: The mitral valve is normal in structure. Tricuspid Valve: The tricuspid valve is normal in structure. Aortic Valve: The aortic valve is tricuspid. Aortic valve sclerosis is present, with no evidence of aortic  valve stenosis. Pulmonic Valve: The pulmonic valve was not assessed. Aorta: Aortic dilatation noted. There is borderline dilatation of the ascending aorta, measuring 37 mm. Venous: The inferior vena cava is normal in size with greater than 50% respiratory variability, suggesting right atrial pressure of 3 mmHg. IAS/Shunts: No atrial level shunt detected by color flow Doppler.  LV Volumes (MOD) LV vol d, MOD A2C: 99.6 ml LV vol d, MOD A4C: 119.5 ml LV vol s, MOD A2C: 47.2 ml LV vol s, MOD A4C: 61.2 ml LV SV MOD A2C:  52.4 ml LV SV MOD A4C:     119.5 ml LV SV MOD BP:      56.2 ml  AORTA Ao Asc diam: 3.70 cm Fransico Him MD Electronically signed by Fransico Him MD Signature Date/Time: 07/09/2022/3:21:31 PM    Final (Updated)    ECHOCARDIOGRAM COMPLETE  Result Date: 07/08/2022    ECHOCARDIOGRAM REPORT   Patient Name:   [name] Date of Exam: 07/08/2022 Medical Rec #:  [id number]       Height:       65.0 in Accession #:    [id number]      Weight:       195.0 lb Date of Birth:  10-20-1940       BSA:          1.957 m Patient Age:    9 years        BP:           122/55 mmHg Patient Gender: M               HR:           78 bpm. Exam Location:  [facility]patient Procedure: 2D Echo, Cardiac Doppler, Color Doppler and Intracardiac            Opacification Agent Indications:    Elevated Troponin  History:        Patient has prior history of Echocardiogram examinations, most                 recent 02/09/2022. CAD, COPD; Risk Factors:Hypertension, Sleep                 Apnea and Dyslipidemia. AAA. Recent fall. Recent history of                 COVID.  Sonographer:    Darlina Sicilian RDCS Referring Phys: [phone] JACOB J STINSON  Sonographer Comments: Suboptimal parasternal window and suboptimal apical window. IMPRESSIONS  1. Left ventricular ejection fraction, by estimation, is 45 to 50%. The left ventricle has mildly decreased function. The left ventricle demonstrates global hypokinesis. Left ventricular diastolic function could  not be evaluated.  2. Right ventricular systolic function was not well visualized. The right ventricular size is normal. Tricuspid regurgitation signal is inadequate for assessing PA pressure.  3. The mitral valve is normal in structure. Trivial mitral valve regurgitation. No evidence of mitral stenosis.  4. The aortic valve was not well visualized. Aortic valve regurgitation is mild to moderate. No aortic stenosis is present. Aortic regurgitation PHT measures 434 msec.  5. The inferior vena cava is normal in size with greater than 50% respiratory variability, suggesting right atrial pressure of 3 mmHg. FINDINGS  Left Ventricle: Left ventricular ejection fraction, by estimation, is 45 to 50%. The left ventricle has mildly decreased function. The left ventricle demonstrates global hypokinesis. Definity contrast agent was given IV to delineate the left ventricular  endocardial borders. The left ventricular internal cavity size was normal in size. There is no left ventricular hypertrophy. Left ventricular diastolic function could not be evaluated. Right Ventricle: The right ventricular size is normal. No increase in right ventricular wall thickness. Right ventricular systolic function was not well visualized. Tricuspid regurgitation signal is inadequate for assessing PA pressure. Left Atrium: Left atrial size was normal in size. Right Atrium: Right atrial size was not well visualized. Pericardium: There is no evidence of pericardial effusion. Mitral Valve: The mitral valve is normal in structure. Trivial mitral valve regurgitation. No evidence of  mitral valve stenosis. Tricuspid Valve: The tricuspid valve is normal in structure. Tricuspid valve regurgitation is not demonstrated. No evidence of tricuspid stenosis. Aortic Valve: The aortic valve was not well visualized. Aortic valve regurgitation is mild to moderate. Aortic regurgitation PHT measures 434 msec. No aortic stenosis is present. Pulmonic Valve: The pulmonic  valve was normal in structure. Pulmonic valve regurgitation is not visualized. No evidence of pulmonic stenosis. Aorta: The aortic root is normal in size and structure. Venous: The inferior vena cava is normal in size with greater than 50% respiratory variability, suggesting right atrial pressure of 3 mmHg. IAS/Shunts: No atrial level shunt detected by color flow Doppler.  LEFT VENTRICLE PLAX 2D LVOT diam:     2.30 cm LV SV:         70 LV SV Index:   36 LVOT Area:     4.15 cm  LV Volumes (MOD) LV vol d, MOD A2C: 158.0 ml LV vol d, MOD A4C: 165.0 ml LV vol s, MOD A2C: 85.7 ml LV vol s, MOD A4C: 87.8 ml LV SV MOD A2C:     72.3 ml LV SV MOD A4C:     165.0 ml LV SV MOD BP:      80.0 ml RIGHT VENTRICLE RV S prime:     9.25 cm/s LEFT ATRIUM             Index LA diam:        3.90 cm 1.99 cm/m LA Vol (A2C):   41.3 ml 21.10 ml/m LA Vol (A4C):   36.2 ml 18.50 ml/m LA Biplane Vol: 41.5 ml 21.21 ml/m  AORTIC VALVE LVOT Vmax:   90.20 cm/s LVOT Vmean:  58.700 cm/s LVOT VTI:    0.168 m AI PHT:      434 msec  AORTA Ao Root diam: 3.50 cm  SHUNTS Systemic VTI:  0.17 m Systemic Diam: 2.30 cm Fransico Him MD Electronically signed by Fransico Him MD Signature Date/Time: 07/08/2022/4:00:28 PM    Final         Scheduled Meds:  amLODipine  10 mg Oral Daily   aspirin EC  81 mg Oral Daily   atorvastatin  80 mg Oral q1800   docusate sodium  100 mg Oral Daily   enoxaparin (LOVENOX) injection  40 mg Subcutaneous Q24H   furosemide  40 mg Oral Daily   lidocaine  1 patch Transdermal Q24H   LORazepam  1 mg Oral QHS   losartan  25 mg Oral Daily   montelukast  10 mg Oral QHS   pantoprazole  40 mg Oral BID   sertraline  50 mg Oral Daily   tamsulosin  0.4 mg Oral QHS     LOS: 0 days    Time spent: 35 minutes    Zelia Yzaguirre Darleen Crocker, DO [facility]  If 7PM-7AM, please contact night-coverage [email or web address] 07/09/2022, 5:09 PM

## 2022-07-10 DIAGNOSIS — R7989 Other specified abnormal findings of blood chemistry: Secondary | ICD-10-CM | POA: Diagnosis not present

## 2022-07-10 MED ORDER — LOSARTAN POTASSIUM 25 MG PO TABS: 25.0000 mg | ORAL_TABLET | Freq: Every day | ORAL | 1 refills | Status: DC

## 2022-07-10 NOTE — Progress Notes (Signed)
Patient discharged with home aftercare instructions, follow up appointments and medication changes. IV removed form Right AC. Son here to transport by personal vehicle . Transported via w/c to son's personal vehicle .,

## 2022-07-10 NOTE — Plan of Care (Signed)

## 2022-07-10 NOTE — Plan of Care (Signed)
  Problem: Clinical Measurements: Goal: Cardiovascular complication will be avoided Outcome: Not Progressing   Problem: Safety: Goal: Ability to remain free from injury will improve Outcome: Not Progressing   

## 2022-07-10 NOTE — Discharge Summary (Signed)
Physician Discharge Summary  TODD JELINSKI JSE:[id number] DOB: Sep 11, 1941 DOA: 07/07/2022  PCP: Glenda Chroman, MD  Admit date: 07/07/2022  Discharge date: 07/10/2022  Admitted From:Home  Disposition:  Home  Recommendations for Outpatient Follow-up:  Follow up with PCP in 1-2 weeks Follow-up with cardiologist Dr. Stanford Breed as scheduled 11/8 Continue on losartan 25 mg daily as prescribed along with other blood pressure agents as noted below and discontinue hydralazine  Home Health: None  Equipment/Devices: None  Discharge Condition:Stable  CODE STATUS: Full  Diet recommendation: Heart Healthy  Brief/Interim Summary: [name] is a 81 y.o. male with medical history significant of coronary artery disease status post stenting in 2008, COPD, hypertension, aortic root dilation.  Patient experienced a fall at home earlier this morning. Noted to have some mild troponin elevation and some new diminished findings on echo as noted below.  He was seen by cardiology with recommendations to start on losartan and avoid further use of hydralazine given that his renal function was stable.  Echocardiogram findings are as noted below and he has had no further acute events or concerns during the course of this hospitalization.  He was seen by physical therapy with no follow-up recommendations.  He is currently in stable condition for discharge.  Discharge Diagnoses:  Principal Problem:   Elevated troponin Active Problems:   CAD (coronary artery disease), history of PTCA only of LCX in 2008 and 50% stenosis of RCA at that time and 50% LAD,    HTN (hypertension)   Hyperlipemia   Gastro-esophageal reflux   Fall at home, initial encounter   Hypokalemia  Principal discharge diagnosis: Elevated troponin in the setting of fall with chronic diastolic CHF and new LVEF 45-50% in the setting of CAD with prior stents.  Discharge Instructions  Discharge Instructions     Diet - low sodium heart  healthy   Complete by: As directed    Increase activity slowly   Complete by: As directed       Allergies as of 07/10/2022   No Known Allergies      Medication List     STOP taking these medications    hydrALAZINE 50 MG tablet Commonly known as: APRESOLINE       TAKE these medications    amLODipine 10 MG tablet Commonly known as: NORVASC Take 1 tablet (10 mg total) by mouth daily.   aspirin EC 81 MG tablet Take 1 tablet (81 mg total) by mouth daily.   atorvastatin 80 MG tablet Commonly known as: LIPITOR Take 80 mg by mouth daily.   cromolyn 5.2 MG/ACT nasal spray Commonly known as: NASALCROM Place 1 spray into both nostrils 3 (three) times daily.   docusate sodium 100 MG capsule Commonly known as: COLACE Take 100 mg by mouth daily.   etodolac 400 MG tablet Commonly known as: LODINE Take 400 mg by mouth 2 (two) times daily.   furosemide 40 MG tablet Commonly known as: LASIX Take 40 mg by mouth daily.   hydrocortisone 2.5 % cream Apply topically 2 (two) times daily as needed.   LORazepam 1 MG tablet Commonly known as: ATIVAN Take 1 mg by mouth See admin instructions. Take 1/2 to 1 tablet by mouth every evening   losartan 25 MG tablet Commonly known as: COZAAR Take 1 tablet (25 mg total) by mouth daily. Start taking on: July 11, 2022   montelukast 10 MG tablet Commonly known as: SINGULAIR Take 10 mg by mouth at bedtime.   nitroGLYCERIN 0.4  MG SL tablet Commonly known as: NITROSTAT Place 0.4 mg under the tongue every 5 (five) minutes as needed for chest pain.   pantoprazole 40 MG tablet Commonly known as: PROTONIX Take 40 mg by mouth 2 (two) times daily.   polyvinyl alcohol 1.4 % ophthalmic solution Commonly known as: LIQUIFILM TEARS Place 1 drop into both eyes as needed for dry eyes.   Potassium Chloride ER 20 MEQ Tbcr Take 20 mEq by mouth 2 (two) times daily.   sertraline 50 MG tablet Commonly known as: ZOLOFT Take 50 mg by mouth  daily.   tamsulosin 0.4 MG Caps capsule Commonly known as: FLOMAX Take 0.4 mg by mouth daily.        Follow-up Information     Vyas, Dhruv B, MD. Schedule an appointment as soon as possible for a visit in 1 week(s).   Specialty: Internal Medicine Contact information: [place] [address] [phone]         Lelon Perla, MD Follow up on 08/08/2022.   Specialty: Cardiology Why: 940am Contact information: [address] [place] [address] [phone]                No Known Allergies  Consultations: Cardiology   Procedures/Studies: ECHOCARDIOGRAM LIMITED  Result Date: 07/09/2022    ECHOCARDIOGRAM LIMITED REPORT   Patient Name:   [name] Date of Exam: 07/09/2022 Medical Rec #:  [id number]       Height:       65.0 in Accession #:    [id number]      Weight:       195.0 lb Date of Birth:  07/16/1941       BSA:          1.957 m Patient Age:    1 years        BP:           142/69 mmHg Patient Gender: M               HR:           59 bpm. Exam Location:  [facility] Procedure: Limited Echo, Cardiac Doppler, Color Doppler and Intracardiac            Opacification Agent Indications:     I42.9 Cardiomyopathy (unspecified)  History:         Patient has prior history of Echocardiogram examinations, most                  recent 07/08/2022. CAD, Abnormal ECG, Arrythmias:Bradycardia,                  Signs/Symptoms:Chest Pain; Risk Factors:Hypertension and Sleep                  Apnea. Cancer.  Sonographer:     Roseanna Rainbow RDCS Referring Phys:  [id number] TRACI R [place] Diagnosing Phys: Fransico Him MD  Sonographer Comments: Technically difficult study due to poor echo windows. IMPRESSIONS  1. Right ventricular systolic function was not well visualized. The right ventricular size is normal. Tricuspid regurgitation signal is inadequate for assessing PA pressure.  2. Left ventricular ejection fraction, by estimation, is 45 to 50%. The left ventricle has mildly  decreased function. The left ventricle has no regional wall motion abnormalities.  3. The mitral valve is normal in structure.  4. The aortic valve is tricuspid. Aortic valve sclerosis is present, with no evidence of aortic valve stenosis.  5. Aortic dilatation  noted. There is borderline dilatation of the ascending aorta, measuring 37 mm.  6. The inferior vena cava is normal in size with greater than 50% respiratory variability, suggesting right atrial pressure of 3 mmHg. FINDINGS  Left Ventricle: Left ventricular ejection fraction, by estimation, is 45 to 50%. The left ventricle has mildly decreased function. The left ventricle has no regional wall motion abnormalities. Definity contrast agent was given IV to delineate the left ventricular endocardial borders. The left ventricular internal cavity size was normal in size. There is no left ventricular hypertrophy. Right Ventricle: The right ventricular size is normal. No increase in right ventricular wall thickness. Right ventricular systolic function was not well visualized. Tricuspid regurgitation signal is inadequate for assessing PA pressure. Left Atrium: Left atrial size was normal in size. Right Atrium: Right atrial size was normal in size. Pericardium: There is no evidence of pericardial effusion. Mitral Valve: The mitral valve is normal in structure. Tricuspid Valve: The tricuspid valve is normal in structure. Aortic Valve: The aortic valve is tricuspid. Aortic valve sclerosis is present, with no evidence of aortic valve stenosis. Pulmonic Valve: The pulmonic valve was not assessed. Aorta: Aortic dilatation noted. There is borderline dilatation of the ascending aorta, measuring 37 mm. Venous: The inferior vena cava is normal in size with greater than 50% respiratory variability, suggesting right atrial pressure of 3 mmHg. IAS/Shunts: No atrial level shunt detected by color flow Doppler.  LV Volumes (MOD) LV vol d, MOD A2C: 99.6 ml LV vol d, MOD A4C: 119.5 ml  LV vol s, MOD A2C: 47.2 ml LV vol s, MOD A4C: 61.2 ml LV SV MOD A2C:     52.4 ml LV SV MOD A4C:     119.5 ml LV SV MOD BP:      56.2 ml  AORTA Ao Asc diam: 3.70 cm Fransico Him MD Electronically signed by Fransico Him MD Signature Date/Time: 07/09/2022/3:21:31 PM    Final (Updated)    ECHOCARDIOGRAM COMPLETE  Result Date: 07/08/2022    ECHOCARDIOGRAM REPORT   Patient Name:   OBADIAH DENNARD Date of Exam: 07/08/2022 Medical Rec #:  [id number]       Height:       65.0 in Accession #:    [id number]      Weight:       195.0 lb Date of Birth:  June 18, 1941       BSA:          1.957 m Patient Age:    57 years        BP:           122/55 mmHg Patient Gender: M               HR:           78 bpm. Exam Location:  [facility] Procedure: 2D Echo, Cardiac Doppler, Color Doppler and Intracardiac            Opacification Agent Indications:    Elevated Troponin  History:        Patient has prior history of Echocardiogram examinations, most                 recent 02/09/2022. CAD, COPD; Risk Factors:Hypertension, Sleep                 Apnea and Dyslipidemia. AAA. Recent fall. Recent history of                 COVID.  Sonographer:    Jonelle Sidle  [place] Referring Phys: [id number] JACOB J STINSON  Sonographer Comments: Suboptimal parasternal window and suboptimal apical window. IMPRESSIONS  1. Left ventricular ejection fraction, by estimation, is 45 to 50%. The left ventricle has mildly decreased function. The left ventricle demonstrates global hypokinesis. Left ventricular diastolic function could not be evaluated.  2. Right ventricular systolic function was not well visualized. The right ventricular size is normal. Tricuspid regurgitation signal is inadequate for assessing PA pressure.  3. The mitral valve is normal in structure. Trivial mitral valve regurgitation. No evidence of mitral stenosis.  4. The aortic valve was not well visualized. Aortic valve regurgitation is mild to moderate. No aortic stenosis is present. Aortic regurgitation  PHT measures 434 msec.  5. The inferior vena cava is normal in size with greater than 50% respiratory variability, suggesting right atrial pressure of 3 mmHg. FINDINGS  Left Ventricle: Left ventricular ejection fraction, by estimation, is 45 to 50%. The left ventricle has mildly decreased function. The left ventricle demonstrates global hypokinesis. Definity contrast agent was given IV to delineate the left ventricular  endocardial borders. The left ventricular internal cavity size was normal in size. There is no left ventricular hypertrophy. Left ventricular diastolic function could not be evaluated. Right Ventricle: The right ventricular size is normal. No increase in right ventricular wall thickness. Right ventricular systolic function was not well visualized. Tricuspid regurgitation signal is inadequate for assessing PA pressure. Left Atrium: Left atrial size was normal in size. Right Atrium: Right atrial size was not well visualized. Pericardium: There is no evidence of pericardial effusion. Mitral Valve: The mitral valve is normal in structure. Trivial mitral valve regurgitation. No evidence of mitral valve stenosis. Tricuspid Valve: The tricuspid valve is normal in structure. Tricuspid valve regurgitation is not demonstrated. No evidence of tricuspid stenosis. Aortic Valve: The aortic valve was not well visualized. Aortic valve regurgitation is mild to moderate. Aortic regurgitation PHT measures 434 msec. No aortic stenosis is present. Pulmonic Valve: The pulmonic valve was normal in structure. Pulmonic valve regurgitation is not visualized. No evidence of pulmonic stenosis. Aorta: The aortic root is normal in size and structure. Venous: The inferior vena cava is normal in size with greater than 50% respiratory variability, suggesting right atrial pressure of 3 mmHg. IAS/Shunts: No atrial level shunt detected by color flow Doppler.  LEFT VENTRICLE PLAX 2D LVOT diam:     2.30 cm LV SV:         70 LV SV Index:    36 LVOT Area:     4.15 cm  LV Volumes (MOD) LV vol d, MOD A2C: 158.0 ml LV vol d, MOD A4C: 165.0 ml LV vol s, MOD A2C: 85.7 ml LV vol s, MOD A4C: 87.8 ml LV SV MOD A2C:     72.3 ml LV SV MOD A4C:     165.0 ml LV SV MOD BP:      80.0 ml RIGHT VENTRICLE RV S prime:     9.25 cm/s LEFT ATRIUM             Index LA diam:        3.90 cm 1.99 cm/m LA Vol (A2C):   41.3 ml 21.10 ml/m LA Vol (A4C):   36.2 ml 18.50 ml/m LA Biplane Vol: 41.5 ml 21.21 ml/m  AORTIC VALVE LVOT Vmax:   90.20 cm/s LVOT Vmean:  58.700 cm/s LVOT VTI:    0.168 m AI PHT:      434 msec  AORTA Ao Root diam: 3.50 cm  SHUNTS Systemic VTI:  0.17 m Systemic Diam: 2.30 cm Fransico Him MD Electronically signed by Fransico Him MD Signature Date/Time: 07/08/2022/4:00:28 PM    Final    CT Head Wo Contrast  Result Date: 07/07/2022 CLINICAL DATA:  81 year old male with history of trauma from a fall. EXAM: CT HEAD WITHOUT CONTRAST CT CERVICAL SPINE WITHOUT CONTRAST TECHNIQUE: Multidetector CT imaging of the head and cervical spine was performed following the standard protocol without intravenous contrast. Multiplanar CT image reconstructions of the cervical spine were also generated. RADIATION DOSE REDUCTION: This exam was performed according to the departmental dose-optimization program which includes automated exposure control, adjustment of the mA and/or kV according to patient size and/or use of iterative reconstruction technique. COMPARISON:  Neck CT 11/06/2019. FINDINGS: CT HEAD FINDINGS Brain: Mild cerebral atrophy. Patchy and confluent areas of decreased attenuation are noted throughout the deep and periventricular white matter of the cerebral hemispheres bilaterally, compatible with chronic microvascular ischemic disease. No evidence of acute infarction, hemorrhage, hydrocephalus, extra-axial collection or mass lesion/mass effect. Vascular: No hyperdense vessel or unexpected calcification. Skull: Normal. Negative for fracture or focal lesion. Chronic  posttraumatic deformity of the nasal arch status post reconstruction, similar to the prior examination. Sinuses/Orbits: Low-attenuation air-fluid level in the left maxillary sinus, new compared to the prior examination. Other: None. CT CERVICAL SPINE FINDINGS Alignment: Normal. Skull base and vertebrae: No acute fracture. No primary bone lesion or focal pathologic process. Soft tissues and spinal canal: No prevertebral fluid or swelling. No visible canal hematoma. Disc levels: Multilevel degenerative disc disease, most severe at C3-C4, C4-C5 and C6-C7. Moderate multilevel facet arthropathy bilaterally. Upper chest: Centrilobular and paraseptal emphysema noted in the apices of the lungs. Other: None. IMPRESSION: 1. No evidence of significant acute traumatic injury to the skull, brain or cervical spine. 2. Mild cerebral atrophy with mild chronic microvascular ischemic changes in the cerebral white matter, as above. 3. Multilevel degenerative disc disease and cervical spondylosis, as above. 4. New low-attenuation air-fluid level in the left maxillary sinus. Clinical correlation for signs and symptoms of sinusitis is recommended. Electronically Signed   By: Vinnie Langton M.D.   On: 07/07/2022 11:35   CT Cervical Spine Wo Contrast  Result Date: 07/07/2022 CLINICAL DATA:  81 year old male with history of trauma from a fall. EXAM: CT HEAD WITHOUT CONTRAST CT CERVICAL SPINE WITHOUT CONTRAST TECHNIQUE: Multidetector CT imaging of the head and cervical spine was performed following the standard protocol without intravenous contrast. Multiplanar CT image reconstructions of the cervical spine were also generated. RADIATION DOSE REDUCTION: This exam was performed according to the departmental dose-optimization program which includes automated exposure control, adjustment of the mA and/or kV according to patient size and/or use of iterative reconstruction technique. COMPARISON:  Neck CT 11/06/2019. FINDINGS: CT HEAD  FINDINGS Brain: Mild cerebral atrophy. Patchy and confluent areas of decreased attenuation are noted throughout the deep and periventricular white matter of the cerebral hemispheres bilaterally, compatible with chronic microvascular ischemic disease. No evidence of acute infarction, hemorrhage, hydrocephalus, extra-axial collection or mass lesion/mass effect. Vascular: No hyperdense vessel or unexpected calcification. Skull: Normal. Negative for fracture or focal lesion. Chronic posttraumatic deformity of the nasal arch status post reconstruction, similar to the prior examination. Sinuses/Orbits: Low-attenuation air-fluid level in the left maxillary sinus, new compared to the prior examination. Other: None. CT CERVICAL SPINE FINDINGS Alignment: Normal. Skull base and vertebrae: No acute fracture. No primary bone lesion or focal pathologic process. Soft tissues and spinal canal: No prevertebral fluid or swelling. No  visible canal hematoma. Disc levels: Multilevel degenerative disc disease, most severe at C3-C4, C4-C5 and C6-C7. Moderate multilevel facet arthropathy bilaterally. Upper chest: Centrilobular and paraseptal emphysema noted in the apices of the lungs. Other: None. IMPRESSION: 1. No evidence of significant acute traumatic injury to the skull, brain or cervical spine. 2. Mild cerebral atrophy with mild chronic microvascular ischemic changes in the cerebral white matter, as above. 3. Multilevel degenerative disc disease and cervical spondylosis, as above. 4. New low-attenuation air-fluid level in the left maxillary sinus. Clinical correlation for signs and symptoms of sinusitis is recommended. Electronically Signed   By: Vinnie Langton M.D.   On: 07/07/2022 11:35   DG Chest Portable 1 View  Result Date: 07/07/2022 CLINICAL DATA:  Fall and dizziness. EXAM: PORTABLE CHEST 1 VIEW COMPARISON:  11/06/2019 chest radiograph and prior studies FINDINGS: The cardiomediastinal silhouette is unchanged. Mild  peribronchial thickening is unchanged. There is no evidence of focal airspace disease, pulmonary edema, suspicious pulmonary nodule/mass, pleural effusion, or pneumothorax. No acute bony abnormalities are identified. IMPRESSION: No active disease. Electronically Signed   By: Margarette Canada M.D.   On: 07/07/2022 10:37     Discharge Exam: Vitals:   07/10/22 0550 07/10/22 0823  BP: (!) 117/57 121/66  Pulse: (!) 51 67  Resp: 20   Temp: 97.8 F (36.6 C)   SpO2: 98%    Vitals:   07/09/22 1440 07/09/22 2116 07/10/22 0550 07/10/22 0823  BP: (!) 106/54 (!) 127/57 (!) 117/57 121/66  Pulse: 62 (!) 59 (!) 51 67  Resp:  16 20   Temp: 98.3 F (36.8 C) 98.1 F (36.7 C) 97.8 F (36.6 C)   TempSrc: Oral Oral    SpO2: 95% 92% 98%   Weight:      Height:        General: Pt is alert, awake, not in acute distress Cardiovascular: RRR, S1/S2 +, no rubs, no gallops Respiratory: CTA bilaterally, no wheezing, no rhonchi Abdominal: Soft, NT, ND, bowel sounds + Extremities: no edema, no cyanosis    The results of significant diagnostics from this hospitalization (including imaging, microbiology, ancillary and laboratory) are listed below for reference.     Microbiology: Recent Results (from the past 240 hour(s))  Resp Panel by RT-PCR (Flu A&B, Covid) Anterior Nasal Swab     Status: None   Collection Time: 07/07/22 10:53 AM   Specimen: Anterior Nasal Swab  Result Value Ref Range Status   SARS Coronavirus 2 by RT PCR NEGATIVE NEGATIVE Final    Comment: (NOTE) SARS-CoV-2 target nucleic acids are NOT DETECTED.  The SARS-CoV-2 RNA is generally detectable in upper respiratory specimens during the acute phase of infection. The lowest concentration of SARS-CoV-2 viral copies this assay can detect is 138 copies/mL. A negative result does not preclude SARS-Cov-2 infection and should not be used as the sole basis for treatment or other patient management decisions. A negative result may occur with   improper specimen collection/handling, submission of specimen other than nasopharyngeal swab, presence of viral mutation(s) within the areas targeted by this assay, and inadequate number of viral copies(<138 copies/mL). A negative result must be combined with clinical observations, patient history, and epidemiological information. The expected result is Negative.  Fact Sheet for Patients:  EntrepreneurPulse.com.au  Fact Sheet for Healthcare Providers:  IncredibleEmployment.be  This test is no t yet approved or cleared by the [place] FDA and  has been authorized for detection and/or diagnosis of SARS-CoV-2 by FDA under an Emergency Use Authorization (EUA).  This EUA will remain  in effect (meaning this test can be used) for the duration of the COVID-19 declaration under Section 564(b)(1) of the Act, 21 U.S.C.section 360bbb-3(b)(1), unless the authorization is terminated  or revoked sooner.       Influenza A by PCR NEGATIVE NEGATIVE Final   Influenza B by PCR NEGATIVE NEGATIVE Final    Comment: (NOTE) The Xpert Xpress SARS-CoV-2/FLU/RSV plus assay is intended as an aid in the diagnosis of influenza from Nasopharyngeal swab specimens and should not be used as a sole basis for treatment. Nasal washings and aspirates are unacceptable for Xpert Xpress SARS-CoV-2/FLU/RSV testing.  Fact Sheet for Patients: EntrepreneurPulse.com.au  Fact Sheet for Healthcare Providers: IncredibleEmployment.be  This test is not yet approved or cleared by the [place] FDA and has been authorized for detection and/or diagnosis of SARS-CoV-2 by FDA under an Emergency Use Authorization (EUA). This EUA will remain in effect (meaning this test can be used) for the duration of the COVID-19 declaration under Section 564(b)(1) of the Act, 21 U.S.C. section 360bbb-3(b)(1), unless the authorization is terminated  or revoked.  Performed at [facility], [address], [place], [place] [address]      Labs: BNP (last 3 results) No results for input(s): "BNP" in the last 8760 hours. Basic Metabolic Panel: Recent Labs  Lab 07/07/22 1024 07/08/22 0631  NA 138 139  K 3.1* 3.8  CL 98 105  CO2 30 28  GLUCOSE 118* 106*  BUN 20 17  CREATININE 0.77 0.72  CALCIUM 9.1 9.0   Liver Function Tests: No results for input(s): "AST", "ALT", "ALKPHOS", "BILITOT", "PROT", "ALBUMIN" in the last 168 hours. No results for input(s): "LIPASE", "AMYLASE" in the last 168 hours. No results for input(s): "AMMONIA" in the last 168 hours. CBC: Recent Labs  Lab 07/07/22 1024  WBC 8.2  NEUTROABS 6.3  HGB 15.1  HCT 44.4  MCV 86.7  PLT 338   Cardiac Enzymes: No results for input(s): "CKTOTAL", "CKMB", "CKMBINDEX", "TROPONINI" in the last 168 hours. BNP: Invalid input(s): "POCBNP" CBG: No results for input(s): "GLUCAP" in the last 168 hours. D-Dimer No results for input(s): "DDIMER" in the last 72 hours. Hgb A1c No results for input(s): "HGBA1C" in the last 72 hours. Lipid Profile No results for input(s): "CHOL", "HDL", "LDLCALC", "TRIG", "CHOLHDL", "LDLDIRECT" in the last 72 hours. Thyroid function studies No results for input(s): "TSH", "T4TOTAL", "T3FREE", "THYROIDAB" in the last 72 hours.  Invalid input(s): "FREET3" Anemia work up No results for input(s): "VITAMINB12", "FOLATE", "FERRITIN", "TIBC", "IRON", "RETICCTPCT" in the last 72 hours. Urinalysis    Component Value Date/Time   COLORURINE STRAW (A) 07/07/2022 1337   APPEARANCEUR CLEAR 07/07/2022 1337   LABSPEC 1.010 07/07/2022 1337   PHURINE 8.0 07/07/2022 1337   GLUCOSEU NEGATIVE 07/07/2022 1337   HGBUR NEGATIVE 07/07/2022 1337   BILIRUBINUR NEGATIVE 07/07/2022 1337   KETONESUR NEGATIVE 07/07/2022 1337   PROTEINUR NEGATIVE 07/07/2022 1337   UROBILINOGEN 0.2 03/19/2015 1711   NITRITE NEGATIVE 07/07/2022 1337   LEUKOCYTESUR NEGATIVE  07/07/2022 1337   Sepsis Labs Recent Labs  Lab 07/07/22 1024  WBC 8.2   Microbiology Recent Results (from the past 240 hour(s))  Resp Panel by RT-PCR (Flu A&B, Covid) Anterior Nasal Swab     Status: None   Collection Time: 07/07/22 10:53 AM   Specimen: Anterior Nasal Swab  Result Value Ref Range Status   SARS Coronavirus 2 by RT PCR NEGATIVE NEGATIVE Final    Comment: (NOTE) SARS-CoV-2 target nucleic acids  are NOT DETECTED.  The SARS-CoV-2 RNA is generally detectable in upper respiratory specimens during the acute phase of infection. The lowest concentration of SARS-CoV-2 viral copies this assay can detect is 138 copies/mL. A negative result does not preclude SARS-Cov-2 infection and should not be used as the sole basis for treatment or other patient management decisions. A negative result may occur with  improper specimen collection/handling, submission of specimen other than nasopharyngeal swab, presence of viral mutation(s) within the areas targeted by this assay, and inadequate number of viral copies(<138 copies/mL). A negative result must be combined with clinical observations, patient history, and epidemiological information. The expected result is Negative.  Fact Sheet for Patients:  EntrepreneurPulse.com.au  Fact Sheet for Healthcare Providers:  IncredibleEmployment.be  This test is no t yet approved or cleared by the [place] FDA and  has been authorized for detection and/or diagnosis of SARS-CoV-2 by FDA under an Emergency Use Authorization (EUA). This EUA will remain  in effect (meaning this test can be used) for the duration of the COVID-19 declaration under Section 564(b)(1) of the Act, 21 U.S.C.section 360bbb-3(b)(1), unless the authorization is terminated  or revoked sooner.       Influenza A by PCR NEGATIVE NEGATIVE Final   Influenza B by PCR NEGATIVE NEGATIVE Final    Comment: (NOTE) The Xpert Xpress  SARS-CoV-2/FLU/RSV plus assay is intended as an aid in the diagnosis of influenza from Nasopharyngeal swab specimens and should not be used as a sole basis for treatment. Nasal washings and aspirates are unacceptable for Xpert Xpress SARS-CoV-2/FLU/RSV testing.  Fact Sheet for Patients: EntrepreneurPulse.com.au  Fact Sheet for Healthcare Providers: IncredibleEmployment.be  This test is not yet approved or cleared by the [place] FDA and has been authorized for detection and/or diagnosis of SARS-CoV-2 by FDA under an Emergency Use Authorization (EUA). This EUA will remain in effect (meaning this test can be used) for the duration of the COVID-19 declaration under Section 564(b)(1) of the Act, 21 U.S.C. section 360bbb-3(b)(1), unless the authorization is terminated or revoked.  Performed at [facility], [address], [place], [place] [address]      Time coordinating discharge: 35 minutes  SIGNED:   Rodena Goldmann, DO [facility] 07/10/2022, 8:45 AM  If 7PM-7AM, please contact night-coverage [email or web address]

## 2022-07-16 ENCOUNTER — Telehealth: Payer: Self-pay | Admitting: Cardiology

## 2022-07-16 NOTE — Telephone Encounter (Signed)
Son states since patient has been out of hospital for a fall and change of medication. He states hydralazine was stopped and Losartan  started.  Per son,they were informed heart was  reduced from an echo . Patient notice patient has been more depressed and anxiety elevated.  Son reviewed on [employer]- can be side effect,   Aware will be reviewed and respond by Dr Stanford Breed.

## 2022-07-16 NOTE — Telephone Encounter (Signed)
Pt c/o medication issue:  1. Name of Medication: losartan (COZAAR) 25 MG tablet  2. How are you currently taking this medication (dosage and times per day)? As prescribed  3. Are you having a reaction (difficulty breathing--STAT)? Yes  4. What is your medication issue? Patient's son is calling stating the patient's anxiety and depression has increased since being put on this medication in the hospital. He is requesting a callback to discuss being put on an alternative medication.

## 2022-07-17 DIAGNOSIS — I10 Essential (primary) hypertension: Secondary | ICD-10-CM | POA: Diagnosis not present

## 2022-07-17 DIAGNOSIS — Z09 Encounter for follow-up examination after completed treatment for conditions other than malignant neoplasm: Secondary | ICD-10-CM | POA: Diagnosis not present

## 2022-07-17 DIAGNOSIS — I5032 Chronic diastolic (congestive) heart failure: Secondary | ICD-10-CM | POA: Diagnosis not present

## 2022-07-17 NOTE — Telephone Encounter (Signed)
Spoke with pt son, he reports the patient went to roll out of bed and did not catch himself and fell on the floor. Bp was 145/41/57. The patient is having problems with leg weakness and his son reports the anxiety and depression have gotten significantly worse since the change in the medication. Follow up appointment made this week with app. Advised they could stop the atorvastatin to see if that makes a difference but that is the only medication we can stop prior to the appointment.

## 2022-07-19 ENCOUNTER — Ambulatory Visit: Payer: No Typology Code available for payment source | Attending: Physician Assistant | Admitting: Physician Assistant

## 2022-07-19 ENCOUNTER — Encounter: Payer: Self-pay | Admitting: Physician Assistant

## 2022-07-19 VITALS — BP 124/48 | HR 69 | Ht 67.0 in | Wt 181.0 lb

## 2022-07-19 DIAGNOSIS — R531 Weakness: Secondary | ICD-10-CM | POA: Diagnosis not present

## 2022-07-19 DIAGNOSIS — I10 Essential (primary) hypertension: Secondary | ICD-10-CM

## 2022-07-19 DIAGNOSIS — I2510 Atherosclerotic heart disease of native coronary artery without angina pectoris: Secondary | ICD-10-CM

## 2022-07-19 DIAGNOSIS — Z955 Presence of coronary angioplasty implant and graft: Secondary | ICD-10-CM

## 2022-07-19 DIAGNOSIS — F322 Major depressive disorder, single episode, severe without psychotic features: Secondary | ICD-10-CM

## 2022-07-19 DIAGNOSIS — E785 Hyperlipidemia, unspecified: Secondary | ICD-10-CM

## 2022-07-19 MED ORDER — AMLODIPINE BESYLATE 5 MG PO TABS: 5.0000 mg | ORAL_TABLET | Freq: Every day | ORAL | 3 refills | Status: DC

## 2022-07-19 NOTE — Patient Instructions (Signed)
Medication Instructions:  DECREASE Amlodipine to 5 mg daily  HOLD Lipitor until follow up appointment   *If you need a refill on your cardiac medications before your next appointment, please call your pharmacy*  Lab Work: Your physician recommends that you return for lab work TODAY:  BMP If you have labs (blood work) drawn today and your tests are completely normal, you will receive your results only by: [place] (if you have [place]) OR A paper copy in the mail If you have any lab test that is abnormal or we need to change your treatment, we will call you to review the results.  Testing/Procedures: NONE ordered at this time of appointment   Follow-Up: At [facility], you and your health needs are our priority.  As part of our continuing mission to provide you with exceptional heart care, we have created designated Provider Care Teams.  These Care Teams include your primary Cardiologist (physician) and Advanced Practice Providers (APPs -  Physician Assistants and Nurse Practitioners) who all work together to provide you with the care you need, when you need it.  We recommend signing up for the patient portal called "MyChart".  Sign up information is provided on this After Visit Summary.  MyChart is used to connect with patients for Virtual Visits (Telemedicine).  Patients are able to view lab/test results, encounter notes, upcoming appointments, etc.  Non-urgent messages can be sent to your provider as well.   To learn more about what you can do with MyChart, go to [email or web address].    Your next appointment:   3-4 week(s)  The format for your next appointment:   In Person  Provider:   APP        Other Instructions   Important Information About Sugar
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
## 2022-07-20 ENCOUNTER — Encounter: Payer: Self-pay | Admitting: Urology

## 2022-07-20 ENCOUNTER — Ambulatory Visit (INDEPENDENT_AMBULATORY_CARE_PROVIDER_SITE_OTHER): Payer: No Typology Code available for payment source | Admitting: Urology

## 2022-07-20 VITALS — BP 100/57 | HR 80 | Ht 67.0 in | Wt 181.0 lb

## 2022-07-20 DIAGNOSIS — N401 Enlarged prostate with lower urinary tract symptoms: Secondary | ICD-10-CM | POA: Diagnosis not present

## 2022-07-20 DIAGNOSIS — N3943 Post-void dribbling: Secondary | ICD-10-CM

## 2022-07-20 DIAGNOSIS — N138 Other obstructive and reflux uropathy: Secondary | ICD-10-CM | POA: Diagnosis not present

## 2022-07-20 LAB — URINALYSIS, ROUTINE W REFLEX MICROSCOPIC
Bilirubin, UA: NEGATIVE
Glucose, UA: NEGATIVE
Ketones, UA: NEGATIVE
Leukocytes,UA: NEGATIVE
Nitrite, UA: NEGATIVE
Protein,UA: NEGATIVE
RBC, UA: NEGATIVE
Specific Gravity, UA: 1.015 (ref 1.005–1.030)
Urobilinogen, Ur: 0.2 mg/dL (ref 0.2–1.0)
pH, UA: 7 (ref 5.0–7.5)

## 2022-07-20 LAB — BASIC METABOLIC PANEL
BUN/Creatinine Ratio: 29 — ABNORMAL HIGH (ref 10–24)
BUN: 24 mg/dL (ref 8–27)
CO2: 26 mmol/L (ref 20–29)
Calcium: 9.5 mg/dL (ref 8.6–10.2)
Chloride: 97 mmol/L (ref 96–106)
Creatinine, Ser: 0.84 mg/dL (ref 0.76–1.27)
Glucose: 98 mg/dL (ref 70–99)
Potassium: 4.1 mmol/L (ref 3.5–5.2)
Sodium: 140 mmol/L (ref 134–144)
eGFR: 87 mL/min/{1.73_m2} (ref >59)

## 2022-07-20 LAB — BLADDER SCAN AMB NON-IMAGING: Scan Result: 78

## 2022-07-20 MED ORDER — MIRABEGRON ER 25 MG PO TB24: 25.0000 mg | ORAL_TABLET | Freq: Every day | ORAL | 0 refills | Status: AC

## 2022-07-20 MED ORDER — NYSTATIN 100000 UNIT/GM EX POWD: 1.0000 | Freq: Two times a day (BID) | CUTANEOUS | 1 refills | Status: DC

## 2022-07-20 NOTE — Progress Notes (Signed)
post void residual =32m

## 2022-07-20 NOTE — Progress Notes (Signed)
Stable renal function and electrolyte
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
## 2022-07-21 ENCOUNTER — Encounter: Payer: Self-pay | Admitting: Physician Assistant

## 2022-08-01 DIAGNOSIS — J42 Unspecified chronic bronchitis: Secondary | ICD-10-CM | POA: Diagnosis not present

## 2022-08-01 DIAGNOSIS — Z8616 Personal history of COVID-19: Secondary | ICD-10-CM | POA: Diagnosis not present

## 2022-08-01 DIAGNOSIS — Z9181 History of falling: Secondary | ICD-10-CM | POA: Diagnosis not present

## 2022-08-01 DIAGNOSIS — Z87891 Personal history of nicotine dependence: Secondary | ICD-10-CM | POA: Diagnosis not present

## 2022-08-01 DIAGNOSIS — Z955 Presence of coronary angioplasty implant and graft: Secondary | ICD-10-CM | POA: Diagnosis not present

## 2022-08-01 DIAGNOSIS — I712 Thoracic aortic aneurysm, without rupture, unspecified: Secondary | ICD-10-CM | POA: Diagnosis not present

## 2022-08-01 DIAGNOSIS — I2510 Atherosclerotic heart disease of native coronary artery without angina pectoris: Secondary | ICD-10-CM | POA: Diagnosis not present

## 2022-08-01 DIAGNOSIS — I5032 Chronic diastolic (congestive) heart failure: Secondary | ICD-10-CM | POA: Diagnosis not present

## 2022-08-01 DIAGNOSIS — J309 Allergic rhinitis, unspecified: Secondary | ICD-10-CM | POA: Diagnosis not present

## 2022-08-01 DIAGNOSIS — Z7951 Long term (current) use of inhaled steroids: Secondary | ICD-10-CM | POA: Diagnosis not present

## 2022-08-01 DIAGNOSIS — I110 Hypertensive heart disease with heart failure: Secondary | ICD-10-CM | POA: Diagnosis not present

## 2022-08-01 DIAGNOSIS — Z7982 Long term (current) use of aspirin: Secondary | ICD-10-CM | POA: Diagnosis not present

## 2022-08-01 DIAGNOSIS — J441 Chronic obstructive pulmonary disease with (acute) exacerbation: Secondary | ICD-10-CM | POA: Diagnosis not present

## 2022-08-01 DIAGNOSIS — E876 Hypokalemia: Secondary | ICD-10-CM | POA: Diagnosis not present

## 2022-08-01 DIAGNOSIS — E785 Hyperlipidemia, unspecified: Secondary | ICD-10-CM | POA: Diagnosis not present

## 2022-08-01 DIAGNOSIS — G4700 Insomnia, unspecified: Secondary | ICD-10-CM | POA: Diagnosis not present

## 2022-08-01 DIAGNOSIS — M109 Gout, unspecified: Secondary | ICD-10-CM | POA: Diagnosis not present

## 2022-08-06 DIAGNOSIS — Z7951 Long term (current) use of inhaled steroids: Secondary | ICD-10-CM | POA: Diagnosis not present

## 2022-08-06 DIAGNOSIS — I2510 Atherosclerotic heart disease of native coronary artery without angina pectoris: Secondary | ICD-10-CM | POA: Diagnosis not present

## 2022-08-06 DIAGNOSIS — I5032 Chronic diastolic (congestive) heart failure: Secondary | ICD-10-CM | POA: Diagnosis not present

## 2022-08-06 DIAGNOSIS — M109 Gout, unspecified: Secondary | ICD-10-CM | POA: Diagnosis not present

## 2022-08-06 DIAGNOSIS — Z7982 Long term (current) use of aspirin: Secondary | ICD-10-CM | POA: Diagnosis not present

## 2022-08-06 DIAGNOSIS — Z87891 Personal history of nicotine dependence: Secondary | ICD-10-CM | POA: Diagnosis not present

## 2022-08-06 DIAGNOSIS — G4700 Insomnia, unspecified: Secondary | ICD-10-CM | POA: Diagnosis not present

## 2022-08-06 DIAGNOSIS — J309 Allergic rhinitis, unspecified: Secondary | ICD-10-CM | POA: Diagnosis not present

## 2022-08-06 DIAGNOSIS — J42 Unspecified chronic bronchitis: Secondary | ICD-10-CM | POA: Diagnosis not present

## 2022-08-06 DIAGNOSIS — J441 Chronic obstructive pulmonary disease with (acute) exacerbation: Secondary | ICD-10-CM | POA: Diagnosis not present

## 2022-08-06 DIAGNOSIS — Z8616 Personal history of COVID-19: Secondary | ICD-10-CM | POA: Diagnosis not present

## 2022-08-06 DIAGNOSIS — E876 Hypokalemia: Secondary | ICD-10-CM | POA: Diagnosis not present

## 2022-08-06 DIAGNOSIS — Z9181 History of falling: Secondary | ICD-10-CM | POA: Diagnosis not present

## 2022-08-06 DIAGNOSIS — E785 Hyperlipidemia, unspecified: Secondary | ICD-10-CM | POA: Diagnosis not present

## 2022-08-06 DIAGNOSIS — Z955 Presence of coronary angioplasty implant and graft: Secondary | ICD-10-CM | POA: Diagnosis not present

## 2022-08-06 DIAGNOSIS — I110 Hypertensive heart disease with heart failure: Secondary | ICD-10-CM | POA: Diagnosis not present

## 2022-08-06 DIAGNOSIS — I712 Thoracic aortic aneurysm, without rupture, unspecified: Secondary | ICD-10-CM | POA: Diagnosis not present

## 2022-08-08 ENCOUNTER — Ambulatory Visit: Payer: Medicare Other | Admitting: Cardiology

## 2022-08-08 DIAGNOSIS — J441 Chronic obstructive pulmonary disease with (acute) exacerbation: Secondary | ICD-10-CM | POA: Diagnosis not present

## 2022-08-08 DIAGNOSIS — I5032 Chronic diastolic (congestive) heart failure: Secondary | ICD-10-CM | POA: Diagnosis not present

## 2022-08-08 DIAGNOSIS — I2510 Atherosclerotic heart disease of native coronary artery without angina pectoris: Secondary | ICD-10-CM | POA: Diagnosis not present

## 2022-08-08 DIAGNOSIS — I110 Hypertensive heart disease with heart failure: Secondary | ICD-10-CM | POA: Diagnosis not present

## 2022-08-09 DIAGNOSIS — I712 Thoracic aortic aneurysm, without rupture, unspecified: Secondary | ICD-10-CM | POA: Diagnosis not present

## 2022-08-09 DIAGNOSIS — I5032 Chronic diastolic (congestive) heart failure: Secondary | ICD-10-CM | POA: Diagnosis not present

## 2022-08-09 DIAGNOSIS — G4700 Insomnia, unspecified: Secondary | ICD-10-CM | POA: Diagnosis not present

## 2022-08-09 DIAGNOSIS — Z7951 Long term (current) use of inhaled steroids: Secondary | ICD-10-CM | POA: Diagnosis not present

## 2022-08-09 DIAGNOSIS — Z9181 History of falling: Secondary | ICD-10-CM | POA: Diagnosis not present

## 2022-08-09 DIAGNOSIS — J309 Allergic rhinitis, unspecified: Secondary | ICD-10-CM | POA: Diagnosis not present

## 2022-08-09 DIAGNOSIS — J42 Unspecified chronic bronchitis: Secondary | ICD-10-CM | POA: Diagnosis not present

## 2022-08-09 DIAGNOSIS — E785 Hyperlipidemia, unspecified: Secondary | ICD-10-CM | POA: Diagnosis not present

## 2022-08-09 DIAGNOSIS — Z955 Presence of coronary angioplasty implant and graft: Secondary | ICD-10-CM | POA: Diagnosis not present

## 2022-08-09 DIAGNOSIS — I2510 Atherosclerotic heart disease of native coronary artery without angina pectoris: Secondary | ICD-10-CM | POA: Diagnosis not present

## 2022-08-09 DIAGNOSIS — E876 Hypokalemia: Secondary | ICD-10-CM | POA: Diagnosis not present

## 2022-08-09 DIAGNOSIS — Z87891 Personal history of nicotine dependence: Secondary | ICD-10-CM | POA: Diagnosis not present

## 2022-08-09 DIAGNOSIS — I110 Hypertensive heart disease with heart failure: Secondary | ICD-10-CM | POA: Diagnosis not present

## 2022-08-09 DIAGNOSIS — Z7982 Long term (current) use of aspirin: Secondary | ICD-10-CM | POA: Diagnosis not present

## 2022-08-09 DIAGNOSIS — Z8616 Personal history of COVID-19: Secondary | ICD-10-CM | POA: Diagnosis not present

## 2022-08-09 DIAGNOSIS — M109 Gout, unspecified: Secondary | ICD-10-CM | POA: Diagnosis not present

## 2022-08-09 DIAGNOSIS — J441 Chronic obstructive pulmonary disease with (acute) exacerbation: Secondary | ICD-10-CM | POA: Diagnosis not present
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
## 2022-08-15 DIAGNOSIS — Z9181 History of falling: Secondary | ICD-10-CM | POA: Diagnosis not present

## 2022-08-15 DIAGNOSIS — I712 Thoracic aortic aneurysm, without rupture, unspecified: Secondary | ICD-10-CM | POA: Diagnosis not present

## 2022-08-15 DIAGNOSIS — I2510 Atherosclerotic heart disease of native coronary artery without angina pectoris: Secondary | ICD-10-CM | POA: Diagnosis not present

## 2022-08-15 DIAGNOSIS — I5032 Chronic diastolic (congestive) heart failure: Secondary | ICD-10-CM | POA: Diagnosis not present

## 2022-08-15 DIAGNOSIS — Z87891 Personal history of nicotine dependence: Secondary | ICD-10-CM | POA: Diagnosis not present

## 2022-08-15 DIAGNOSIS — G4700 Insomnia, unspecified: Secondary | ICD-10-CM | POA: Diagnosis not present

## 2022-08-15 DIAGNOSIS — J441 Chronic obstructive pulmonary disease with (acute) exacerbation: Secondary | ICD-10-CM | POA: Diagnosis not present

## 2022-08-15 DIAGNOSIS — Z7982 Long term (current) use of aspirin: Secondary | ICD-10-CM | POA: Diagnosis not present

## 2022-08-15 DIAGNOSIS — J309 Allergic rhinitis, unspecified: Secondary | ICD-10-CM | POA: Diagnosis not present

## 2022-08-15 DIAGNOSIS — E785 Hyperlipidemia, unspecified: Secondary | ICD-10-CM | POA: Diagnosis not present

## 2022-08-15 DIAGNOSIS — Z8616 Personal history of COVID-19: Secondary | ICD-10-CM | POA: Diagnosis not present

## 2022-08-15 DIAGNOSIS — Z7951 Long term (current) use of inhaled steroids: Secondary | ICD-10-CM | POA: Diagnosis not present

## 2022-08-15 DIAGNOSIS — Z955 Presence of coronary angioplasty implant and graft: Secondary | ICD-10-CM | POA: Diagnosis not present

## 2022-08-15 DIAGNOSIS — M109 Gout, unspecified: Secondary | ICD-10-CM | POA: Diagnosis not present

## 2022-08-15 DIAGNOSIS — I110 Hypertensive heart disease with heart failure: Secondary | ICD-10-CM | POA: Diagnosis not present

## 2022-08-15 DIAGNOSIS — E876 Hypokalemia: Secondary | ICD-10-CM | POA: Diagnosis not present

## 2022-08-15 DIAGNOSIS — J42 Unspecified chronic bronchitis: Secondary | ICD-10-CM | POA: Diagnosis not present

## 2022-08-17 DIAGNOSIS — I110 Hypertensive heart disease with heart failure: Secondary | ICD-10-CM | POA: Diagnosis not present

## 2022-08-17 DIAGNOSIS — I2510 Atherosclerotic heart disease of native coronary artery without angina pectoris: Secondary | ICD-10-CM | POA: Diagnosis not present

## 2022-08-17 DIAGNOSIS — J309 Allergic rhinitis, unspecified: Secondary | ICD-10-CM | POA: Diagnosis not present

## 2022-08-17 DIAGNOSIS — I712 Thoracic aortic aneurysm, without rupture, unspecified: Secondary | ICD-10-CM | POA: Diagnosis not present

## 2022-08-17 DIAGNOSIS — I5032 Chronic diastolic (congestive) heart failure: Secondary | ICD-10-CM | POA: Diagnosis not present

## 2022-08-17 DIAGNOSIS — Z955 Presence of coronary angioplasty implant and graft: Secondary | ICD-10-CM | POA: Diagnosis not present

## 2022-08-17 DIAGNOSIS — E876 Hypokalemia: Secondary | ICD-10-CM | POA: Diagnosis not present

## 2022-08-17 DIAGNOSIS — Z8616 Personal history of COVID-19: Secondary | ICD-10-CM | POA: Diagnosis not present

## 2022-08-17 DIAGNOSIS — Z7982 Long term (current) use of aspirin: Secondary | ICD-10-CM | POA: Diagnosis not present

## 2022-08-17 DIAGNOSIS — E785 Hyperlipidemia, unspecified: Secondary | ICD-10-CM | POA: Diagnosis not present

## 2022-08-17 DIAGNOSIS — J42 Unspecified chronic bronchitis: Secondary | ICD-10-CM | POA: Diagnosis not present

## 2022-08-17 DIAGNOSIS — M109 Gout, unspecified: Secondary | ICD-10-CM | POA: Diagnosis not present

## 2022-08-17 DIAGNOSIS — G4700 Insomnia, unspecified: Secondary | ICD-10-CM | POA: Diagnosis not present

## 2022-08-17 DIAGNOSIS — J441 Chronic obstructive pulmonary disease with (acute) exacerbation: Secondary | ICD-10-CM | POA: Diagnosis not present

## 2022-08-17 DIAGNOSIS — Z9181 History of falling: Secondary | ICD-10-CM | POA: Diagnosis not present

## 2022-08-17 DIAGNOSIS — Z87891 Personal history of nicotine dependence: Secondary | ICD-10-CM | POA: Diagnosis not present

## 2022-08-17 DIAGNOSIS — Z7951 Long term (current) use of inhaled steroids: Secondary | ICD-10-CM | POA: Diagnosis not present

## 2022-08-20 DIAGNOSIS — Z9181 History of falling: Secondary | ICD-10-CM | POA: Diagnosis not present

## 2022-08-20 DIAGNOSIS — E876 Hypokalemia: Secondary | ICD-10-CM | POA: Diagnosis not present

## 2022-08-20 DIAGNOSIS — J441 Chronic obstructive pulmonary disease with (acute) exacerbation: Secondary | ICD-10-CM | POA: Diagnosis not present

## 2022-08-20 DIAGNOSIS — Z8616 Personal history of COVID-19: Secondary | ICD-10-CM | POA: Diagnosis not present

## 2022-08-20 DIAGNOSIS — I5032 Chronic diastolic (congestive) heart failure: Secondary | ICD-10-CM | POA: Diagnosis not present

## 2022-08-20 DIAGNOSIS — J309 Allergic rhinitis, unspecified: Secondary | ICD-10-CM | POA: Diagnosis not present

## 2022-08-20 DIAGNOSIS — M109 Gout, unspecified: Secondary | ICD-10-CM | POA: Diagnosis not present

## 2022-08-20 DIAGNOSIS — Z7982 Long term (current) use of aspirin: Secondary | ICD-10-CM | POA: Diagnosis not present

## 2022-08-20 DIAGNOSIS — I2510 Atherosclerotic heart disease of native coronary artery without angina pectoris: Secondary | ICD-10-CM | POA: Diagnosis not present

## 2022-08-20 DIAGNOSIS — Z7951 Long term (current) use of inhaled steroids: Secondary | ICD-10-CM | POA: Diagnosis not present

## 2022-08-20 DIAGNOSIS — G4700 Insomnia, unspecified: Secondary | ICD-10-CM | POA: Diagnosis not present

## 2022-08-20 DIAGNOSIS — J42 Unspecified chronic bronchitis: Secondary | ICD-10-CM | POA: Diagnosis not present

## 2022-08-20 DIAGNOSIS — Z87891 Personal history of nicotine dependence: Secondary | ICD-10-CM | POA: Diagnosis not present

## 2022-08-20 DIAGNOSIS — E785 Hyperlipidemia, unspecified: Secondary | ICD-10-CM | POA: Diagnosis not present

## 2022-08-20 DIAGNOSIS — I110 Hypertensive heart disease with heart failure: Secondary | ICD-10-CM | POA: Diagnosis not present

## 2022-08-20 DIAGNOSIS — I712 Thoracic aortic aneurysm, without rupture, unspecified: Secondary | ICD-10-CM | POA: Diagnosis not present

## 2022-08-20 DIAGNOSIS — Z955 Presence of coronary angioplasty implant and graft: Secondary | ICD-10-CM | POA: Diagnosis not present

## 2022-08-21 DIAGNOSIS — I10 Essential (primary) hypertension: Secondary | ICD-10-CM | POA: Diagnosis not present

## 2022-08-21 DIAGNOSIS — Z299 Encounter for prophylactic measures, unspecified: Secondary | ICD-10-CM | POA: Diagnosis not present

## 2022-08-27 ENCOUNTER — Ambulatory Visit: Payer: No Typology Code available for payment source | Admitting: Urology
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
## 2022-08-28 ENCOUNTER — Encounter: Payer: Self-pay | Admitting: Cardiology

## 2022-08-28 ENCOUNTER — Ambulatory Visit: Payer: No Typology Code available for payment source | Attending: Cardiology | Admitting: Cardiology

## 2022-08-28 VITALS — BP 120/52 | HR 59 | Ht 67.0 in | Wt 194.4 lb

## 2022-08-28 DIAGNOSIS — I10 Essential (primary) hypertension: Secondary | ICD-10-CM

## 2022-08-28 DIAGNOSIS — I714 Abdominal aortic aneurysm, without rupture, unspecified: Secondary | ICD-10-CM

## 2022-08-28 DIAGNOSIS — I2510 Atherosclerotic heart disease of native coronary artery without angina pectoris: Secondary | ICD-10-CM

## 2022-08-28 DIAGNOSIS — I351 Nonrheumatic aortic (valve) insufficiency: Secondary | ICD-10-CM

## 2022-08-28 DIAGNOSIS — E785 Hyperlipidemia, unspecified: Secondary | ICD-10-CM | POA: Diagnosis not present

## 2022-08-28 DIAGNOSIS — I77810 Thoracic aortic ectasia: Secondary | ICD-10-CM | POA: Diagnosis not present

## 2022-08-28 MED ORDER — NITROGLYCERIN 0.4 MG SL SUBL: 0.4000 mg | SUBLINGUAL_TABLET | SUBLINGUAL | 11 refills | Status: AC | PRN

## 2022-08-28 MED ORDER — ROSUVASTATIN CALCIUM 20 MG PO TABS: 20.0000 mg | ORAL_TABLET | Freq: Every day | ORAL | 3 refills | Status: DC

## 2022-08-28 MED ORDER — LOSARTAN POTASSIUM 25 MG PO TABS: 25.0000 mg | ORAL_TABLET | Freq: Every day | ORAL | 3 refills | Status: DC

## 2022-08-28 NOTE — Addendum Note (Signed)
Addended by: Cristopher Estimable on: 08/28/2022 02:04 PM   Modules accepted: Orders

## 2022-08-28 NOTE — Patient Instructions (Addendum)
Medication Instructions:   START ROSUVASTATIN 20 MG ONCE DAILY  *If you need a refill on your cardiac medications before your next appointment, please call your pharmacy*   Lab Work:  Your physician recommends that you return for lab work in: [place]  If you have labs (blood work) drawn today and your tests are completely normal, you will receive your results only by: [place] (if you have MyChart) OR A paper copy in the mail If you have any lab test that is abnormal or we need to change your treatment, we will call you to review the results.   Your physician has requested that you have an abdominal aorta duplex. During this test, an ultrasound is used to evaluate the aorta. Allow 30 minutes for this exam. Do not eat after midnight the day before and avoid carbonated beverages [place] OFFICE   CTA OF THE CHEST W/WO TO FOLLOW UP SIZE OF AORTA AT [place]: At [facility], you and your health needs are our priority.  As part of our continuing mission to provide you with exceptional heart care, we have created designated Provider Care Teams.  These Care Teams include your primary Cardiologist (physician) and Advanced Practice Providers (APPs -  Physician Assistants and Nurse Practitioners) who all work together to provide you with the care you need, when you need it.  We recommend signing up for the patient portal called "MyChart".  Sign up information is provided on this After Visit Summary.  MyChart is used to connect with patients for Virtual Visits (Telemedicine).  Patients are able to view lab/test results, encounter notes, upcoming appointments, etc.  Non-urgent messages can be sent to your provider as well.   To learn more about what you can do with MyChart, go to [email or web address].    Your next appointment:   6 month(s)  The format for your next appointment:   In Person  Provider:   Kirk Ruths, MD

## 2022-08-29 DIAGNOSIS — J441 Chronic obstructive pulmonary disease with (acute) exacerbation: Secondary | ICD-10-CM | POA: Diagnosis not present

## 2022-08-29 DIAGNOSIS — M109 Gout, unspecified: Secondary | ICD-10-CM | POA: Diagnosis not present

## 2022-08-29 DIAGNOSIS — Z8616 Personal history of COVID-19: Secondary | ICD-10-CM | POA: Diagnosis not present

## 2022-08-29 DIAGNOSIS — I5032 Chronic diastolic (congestive) heart failure: Secondary | ICD-10-CM | POA: Diagnosis not present

## 2022-08-29 DIAGNOSIS — E876 Hypokalemia: Secondary | ICD-10-CM | POA: Diagnosis not present

## 2022-08-29 DIAGNOSIS — I2510 Atherosclerotic heart disease of native coronary artery without angina pectoris: Secondary | ICD-10-CM | POA: Diagnosis not present

## 2022-08-29 DIAGNOSIS — E785 Hyperlipidemia, unspecified: Secondary | ICD-10-CM | POA: Diagnosis not present

## 2022-08-29 DIAGNOSIS — I110 Hypertensive heart disease with heart failure: Secondary | ICD-10-CM | POA: Diagnosis not present

## 2022-08-29 DIAGNOSIS — J309 Allergic rhinitis, unspecified: Secondary | ICD-10-CM | POA: Diagnosis not present

## 2022-08-29 DIAGNOSIS — G4700 Insomnia, unspecified: Secondary | ICD-10-CM | POA: Diagnosis not present

## 2022-08-29 DIAGNOSIS — Z9181 History of falling: Secondary | ICD-10-CM | POA: Diagnosis not present

## 2022-08-29 DIAGNOSIS — Z7982 Long term (current) use of aspirin: Secondary | ICD-10-CM | POA: Diagnosis not present

## 2022-08-29 DIAGNOSIS — I712 Thoracic aortic aneurysm, without rupture, unspecified: Secondary | ICD-10-CM | POA: Diagnosis not present

## 2022-08-29 DIAGNOSIS — Z7951 Long term (current) use of inhaled steroids: Secondary | ICD-10-CM | POA: Diagnosis not present

## 2022-08-29 DIAGNOSIS — Z87891 Personal history of nicotine dependence: Secondary | ICD-10-CM | POA: Diagnosis not present

## 2022-08-29 DIAGNOSIS — J42 Unspecified chronic bronchitis: Secondary | ICD-10-CM | POA: Diagnosis not present

## 2022-08-29 DIAGNOSIS — Z955 Presence of coronary angioplasty implant and graft: Secondary | ICD-10-CM | POA: Diagnosis not present

## 2022-09-13 ENCOUNTER — Ambulatory Visit: Payer: No Typology Code available for payment source | Attending: Cardiology

## 2022-09-13 ENCOUNTER — Other Ambulatory Visit: Payer: Self-pay | Admitting: *Deleted

## 2022-09-13 ENCOUNTER — Telehealth: Payer: Self-pay | Admitting: Cardiology

## 2022-09-13 DIAGNOSIS — I7143 Infrarenal abdominal aortic aneurysm, without rupture: Secondary | ICD-10-CM

## 2022-09-13 DIAGNOSIS — E785 Hyperlipidemia, unspecified: Secondary | ICD-10-CM

## 2022-09-13 DIAGNOSIS — I714 Abdominal aortic aneurysm, without rupture, unspecified: Secondary | ICD-10-CM

## 2022-09-13 MED ORDER — ROSUVASTATIN CALCIUM 20 MG PO TABS: 20.0000 mg | ORAL_TABLET | Freq: Every day | ORAL | 3 refills | Status: DC

## 2022-09-13 NOTE — Telephone Encounter (Signed)
Left message for julia, discharge summ and the last 2 office notes faxed to the number provided.

## 2022-09-13 NOTE — Telephone Encounter (Signed)
Routed to Medical Records and Doctor, hospital

## 2022-09-13 NOTE — Telephone Encounter (Signed)
Gregary Signs with [facility] is requesting that we fax a copy of patient's most recent office visit note, including new medications (Losartan, Potassium, and Nitroglycerin). Gregary Signs would like a call back from Hilda Blades to discuss as well.  Phone#: [phone] (option: pact 2) Fax#: [phone]

## 2022-09-17 DIAGNOSIS — I77810 Thoracic aortic ectasia: Secondary | ICD-10-CM | POA: Diagnosis not present

## 2022-09-18 ENCOUNTER — Encounter: Payer: Self-pay | Admitting: Cardiology

## 2022-09-18 DIAGNOSIS — D0461 Carcinoma in situ of skin of right upper limb, including shoulder: Secondary | ICD-10-CM | POA: Diagnosis not present

## 2022-09-18 DIAGNOSIS — L820 Inflamed seborrheic keratosis: Secondary | ICD-10-CM | POA: Diagnosis not present

## 2022-09-19 ENCOUNTER — Encounter: Payer: Self-pay | Admitting: *Deleted

## 2022-09-21 ENCOUNTER — Encounter: Payer: Self-pay | Admitting: *Deleted

## 2022-09-28 ENCOUNTER — Ambulatory Visit (HOSPITAL_COMMUNITY)
Admission: RE | Admit: 2022-09-28 | Discharge: 2022-09-28 | Disposition: A | Discharge status: Home / Self Care | Payer: Medicare Other | Source: Ambulatory Visit | Attending: Cardiology | Admitting: Cardiology

## 2022-09-28 DIAGNOSIS — J439 Emphysema, unspecified: Secondary | ICD-10-CM | POA: Diagnosis not present

## 2022-09-28 DIAGNOSIS — I77810 Thoracic aortic ectasia: Secondary | ICD-10-CM | POA: Insufficient documentation

## 2022-09-28 MED ORDER — IOHEXOL 350 MG/ML SOLN
100.0000 mL | Freq: Once | INTRAVENOUS | Status: AC | PRN
  Administered 2022-09-28: 100 mL via INTRAVENOUS

## 2022-10-16 ENCOUNTER — Telehealth: Payer: Self-pay | Admitting: Cardiology

## 2022-10-16 NOTE — Telephone Encounter (Signed)
Spoke with son who stated patient is having joint pain in hands, knees, and back and attributes it to rosuvastatin. Please advise.

## 2022-10-16 NOTE — Telephone Encounter (Signed)
Called patient's son, advised of message below.   Patient son verbalized understanding.  Will update [place] in 4 weeks.

## 2022-10-16 NOTE — Telephone Encounter (Signed)
Pt c/o medication issue:  1. Name of Medication: rosuvastatin (CRESTOR) 20 MG tablet   2. How are you currently taking this medication (dosage and times per day)?   3. Are you having a reaction (difficulty breathing--STAT)?   4. What is your medication issue? Pt son states pt is having joint pain since taking this medication. He states he also has had back pain and headache but the joint pain is more severe. They want to know if there is something else he can take.

## 2022-12-12 DIAGNOSIS — J449 Chronic obstructive pulmonary disease, unspecified: Secondary | ICD-10-CM | POA: Diagnosis not present

## 2022-12-12 DIAGNOSIS — Z7189 Other specified counseling: Secondary | ICD-10-CM | POA: Diagnosis not present

## 2022-12-12 DIAGNOSIS — I10 Essential (primary) hypertension: Secondary | ICD-10-CM | POA: Diagnosis not present

## 2022-12-12 DIAGNOSIS — I77810 Thoracic aortic ectasia: Secondary | ICD-10-CM | POA: Diagnosis not present

## 2022-12-12 DIAGNOSIS — Z299 Encounter for prophylactic measures, unspecified: Secondary | ICD-10-CM | POA: Diagnosis not present

## 2022-12-12 DIAGNOSIS — Z0000 Encounter for general adult medical examination without abnormal findings: Secondary | ICD-10-CM | POA: Diagnosis not present

## 2022-12-27 ENCOUNTER — Telehealth: Payer: Self-pay

## 2022-12-27 NOTE — Telephone Encounter (Signed)
I contacted patient's son and left a vm advising him to contact our office and advise if his father wished to continue care with our office. Patients [facility] authorization expires 01/16/23 We will need to resubmit a authorization request for the next 6 months if he wished to continue care with our facility. Patient was last seen 07/20/22.

## 2023-01-21 DIAGNOSIS — E785 Hyperlipidemia, unspecified: Secondary | ICD-10-CM | POA: Diagnosis not present
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
## 2023-02-11 ENCOUNTER — Ambulatory Visit: Payer: No Typology Code available for payment source | Attending: Cardiology | Admitting: Cardiology

## 2023-02-11 ENCOUNTER — Encounter: Payer: Self-pay | Admitting: Cardiology

## 2023-02-11 VITALS — BP 148/56 | HR 60 | Ht 66.0 in | Wt 207.2 lb

## 2023-02-11 DIAGNOSIS — E785 Hyperlipidemia, unspecified: Secondary | ICD-10-CM | POA: Diagnosis not present

## 2023-02-11 DIAGNOSIS — I351 Nonrheumatic aortic (valve) insufficiency: Secondary | ICD-10-CM | POA: Diagnosis not present

## 2023-02-11 DIAGNOSIS — I714 Abdominal aortic aneurysm, without rupture, unspecified: Secondary | ICD-10-CM | POA: Diagnosis not present

## 2023-02-11 DIAGNOSIS — I2510 Atherosclerotic heart disease of native coronary artery without angina pectoris: Secondary | ICD-10-CM | POA: Diagnosis not present

## 2023-02-11 DIAGNOSIS — I77810 Thoracic aortic ectasia: Secondary | ICD-10-CM

## 2023-02-11 DIAGNOSIS — I10 Essential (primary) hypertension: Secondary | ICD-10-CM

## 2023-02-11 MED ORDER — AMLODIPINE BESYLATE 10 MG PO TABS: 10.0000 mg | ORAL_TABLET | Freq: Every day | ORAL | 3 refills | Status: DC

## 2023-02-11 NOTE — Patient Instructions (Signed)
Medication Instructions:  Increase amlodipine to 10 mg daily *If you need a refill on your cardiac medications before your next appointment, please call your pharmacy*  Testing/Procedures: Your physician has requested that you have an echocardiogram in December. Echocardiography is a painless test that uses sound waves to create images of your heart. It provides your doctor with information about the size and shape of your heart and how well your heart's chambers and valves are working. This procedure takes approximately one hour. There are no restrictions for this procedure. Please do NOT wear cologne, perfume, aftershave, or lotions (deodorant is allowed). Please arrive 15 minutes prior to your appointment time.   CT of the Thoracic Aorta in December   Follow-Up: At [facility], you and your health needs are our priority.  As part of our continuing mission to provide you with exceptional heart care, we have created designated Provider Care Teams.  These Care Teams include your primary Cardiologist (physician) and Advanced Practice Providers (APPs -  Physician Assistants and Nurse Practitioners) who all work together to provide you with the care you need, when you need it.  We recommend signing up for the patient portal called "MyChart".  Sign up information is provided on this After Visit Summary.  MyChart is used to connect with patients for Virtual Visits (Telemedicine).  Patients are able to view lab/test results, encounter notes, upcoming appointments, etc.  Non-urgent messages can be sent to your provider as well.   To learn more about what you can do with MyChart, go to [email or web address].    Your next appointment:   1 year(s)  Provider:   Olga Millers, MD

## 2023-02-11 NOTE — Addendum Note (Signed)
Addended by: Scheryl Marten on: 02/11/2023 10:27 AM   Modules accepted: Orders

## 2023-02-26 DIAGNOSIS — R5383 Other fatigue: Secondary | ICD-10-CM | POA: Diagnosis not present

## 2023-02-26 DIAGNOSIS — Z79899 Other long term (current) drug therapy: Secondary | ICD-10-CM | POA: Diagnosis not present

## 2023-02-26 DIAGNOSIS — E7800 Pure hypercholesterolemia, unspecified: Secondary | ICD-10-CM | POA: Diagnosis not present

## 2023-02-26 DIAGNOSIS — Z0000 Encounter for general adult medical examination without abnormal findings: Secondary | ICD-10-CM | POA: Diagnosis not present

## 2023-02-26 DIAGNOSIS — I10 Essential (primary) hypertension: Secondary | ICD-10-CM | POA: Diagnosis not present

## 2023-02-26 DIAGNOSIS — Z299 Encounter for prophylactic measures, unspecified: Secondary | ICD-10-CM | POA: Diagnosis not present

## 2023-04-29 DIAGNOSIS — Z299 Encounter for prophylactic measures, unspecified: Secondary | ICD-10-CM | POA: Diagnosis not present

## 2023-04-29 DIAGNOSIS — J449 Chronic obstructive pulmonary disease, unspecified: Secondary | ICD-10-CM | POA: Diagnosis not present

## 2023-04-29 DIAGNOSIS — J3489 Other specified disorders of nose and nasal sinuses: Secondary | ICD-10-CM | POA: Diagnosis not present

## 2023-04-29 DIAGNOSIS — D692 Other nonthrombocytopenic purpura: Secondary | ICD-10-CM | POA: Diagnosis not present

## 2023-05-15 ENCOUNTER — Telehealth: Payer: Self-pay | Admitting: *Deleted

## 2023-05-15 NOTE — Patient Outreach (Signed)
  Care Coordination   05/15/2023 Name: [name] MRN: [id number] DOB: 24-Dec-1940   Care Coordination Outreach Attempts:  An unsuccessful telephone outreach was attempted today to offer the patient information about available care coordination services.  Follow Up Plan:  Additional outreach attempts will be made to offer the patient care coordination information and services.   Encounter Outcome:  No Answer. Left HIPAA compliant VM.   Care Coordination Interventions:  No, not indicated    Demetrios Loll, BSN, RN-BC RN Care Coordinator [facility]  [facility] Direct Dial: [phone] Main #: [phone]

## 2023-05-31 DIAGNOSIS — R609 Edema, unspecified: Secondary | ICD-10-CM | POA: Diagnosis not present

## 2023-05-31 DIAGNOSIS — I10 Essential (primary) hypertension: Secondary | ICD-10-CM | POA: Diagnosis not present

## 2023-05-31 DIAGNOSIS — Z299 Encounter for prophylactic measures, unspecified: Secondary | ICD-10-CM | POA: Diagnosis not present

## 2023-09-04 ENCOUNTER — Ambulatory Visit (HOSPITAL_COMMUNITY): Payer: No Typology Code available for payment source | Attending: Cardiology

## 2023-10-15 ENCOUNTER — Ambulatory Visit (HOSPITAL_COMMUNITY): Payer: Medicare Other | Attending: Cardiology

## 2023-10-15 DIAGNOSIS — I77810 Thoracic aortic ectasia: Secondary | ICD-10-CM | POA: Insufficient documentation

## 2023-10-15 DIAGNOSIS — I351 Nonrheumatic aortic (valve) insufficiency: Secondary | ICD-10-CM | POA: Diagnosis not present

## 2023-10-15 DIAGNOSIS — I2510 Atherosclerotic heart disease of native coronary artery without angina pectoris: Secondary | ICD-10-CM | POA: Insufficient documentation

## 2023-10-15 LAB — ECHOCARDIOGRAM COMPLETE
Area-P 1/2: 2.96 cm2
P 1/2 time: 459 ms
S' Lateral: 3.95 cm

## 2023-10-16 ENCOUNTER — Other Ambulatory Visit (HOSPITAL_COMMUNITY): Payer: Self-pay | Admitting: *Deleted

## 2023-10-16 MED ORDER — METOPROLOL SUCCINATE ER 25 MG PO TB24: 25.0000 mg | ORAL_TABLET | Freq: Every day | ORAL | 11 refills | Status: DC

## 2023-10-16 MED ORDER — SACUBITRIL-VALSARTAN 24-26 MG PO TABS: 1.0000 | ORAL_TABLET | Freq: Two times a day (BID) | ORAL | 11 refills | Status: DC

## 2023-10-30 ENCOUNTER — Telehealth: Payer: Self-pay | Admitting: Cardiology

## 2023-10-30 MED ORDER — METOPROLOL SUCCINATE ER 25 MG PO TB24: 25.0000 mg | ORAL_TABLET | Freq: Every day | ORAL | 3 refills | Status: DC

## 2023-10-30 MED ORDER — SACUBITRIL-VALSARTAN 24-26 MG PO TABS: 1.0000 | ORAL_TABLET | Freq: Two times a day (BID) | ORAL | 3 refills | Status: DC

## 2023-10-30 NOTE — Telephone Encounter (Signed)
Pt's medications were sent to pt's pharmacy as requested. Confirmation received.

## 2023-10-30 NOTE — Telephone Encounter (Signed)
*  STAT* If patient is at the pharmacy, call can be transferred to refill team.   1. Which medications need to be refilled? (please list name of each medication and dose if known) Entresto and Metoprolol   2. Would you like to learn more about the convenience, safety, & potential cost savings by using the [facility]?     3. Are you open to using the [facility] (Type [facility]. .   4. Which pharmacy/location (including street and city if local pharmacy) is medication to be sent to? [facility] [place] ,[place] please fax this prescription to this pharmacy   5. Do they need a 30 day or 90 day supply? 30 days and refills

## 2023-11-08 ENCOUNTER — Telehealth: Payer: Self-pay | Admitting: Cardiology

## 2023-11-08 NOTE — Telephone Encounter (Signed)
 Pt c/o medication issue:  1. Name of Medication: Amlodipine , and Losartan , Entresto  and Metoprolol   2. How are you currently taking this medication (dosage and times per day)?   3. Are you having a reaction (difficulty breathing--STAT)?   4. What is your medication issue? Daughter says the [place] said they need a written statement from Dr Pietro stating why the patient was taken off his Amlodipine  and Losartan  and why he was put on Entresto  and Metoprolol . Please fax this to [phone] Att: Pack Team 2
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
## 2023-11-11 NOTE — Telephone Encounter (Signed)
 Left message for [name], this office note and the echo results have been faxed to the number provided.

## 2023-11-13 NOTE — Telephone Encounter (Signed)
Patient's daughter is following up. She states the [place] informed her they received the fax, but she still didn't have the new medication change documented in statement. She states patient will be out of medication in 2 days.

## 2023-11-13 NOTE — Telephone Encounter (Signed)
Spoke with pt, aware I have faxed the echo again to the number provided. The recommendations for medication changes are on the echo results.

## 2023-11-22 ENCOUNTER — Ambulatory Visit: Payer: No Typology Code available for payment source | Admitting: Cardiology

## 2023-12-12 NOTE — Progress Notes (Signed)
 Cardiology Office Note    Date:  12/14/2023  ID:  [name], DOB [birth year], MRN [id number] PCP:  Ignatius Specking, MD  Cardiologist:  Olga Millers, MD  Electrophysiologist:  None   Chief Complaint: Follow up for HFrEF   History of Present Illness: .    [name] is a 83 y.o. male with visit-pertinent history of CAD, AAA, COPD, hypertension, hyperlipidemia and history of laryngeal cancer s/p radiation therapy.  In 2008 patient had PCI of OM2/OM 3.  Patient had PCI of his LAD in December 2014 and had a 60% residual mid RCA lesion.  Echocardiogram in July 2017 showed normal EF, mild diastolic dysfunction, mild aortic and mitral regurgitation, mildly dilated aortic root measuring at 4.2 cm, moderate LAE.  Myoview in November 2019 showed EF 54%, no ischemia.  CTA obtained in November 2019 showed 4.5 cm thoracic aortic aneurysm, abdominal aorta measuring 2.5 cm.  CTA in December 2020 showed dilation of the aortic root at 4.4 cm.  Myoview obtained in 05/2020 was normal with no ischemia or infarction, EF 54%.  In March 2023 patient was seen in office with lightheadedness and dependent bilateral lower extremity edema.  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  In September 2023 patient became weak and lost his appetite following COVID infection.  He was hospitalized due to fall found to have elevated troponin in the 60s however serial troponin was flat.  Echo showed EF 4550% with global hypokinesis.  Patient denied any chest pain, he was not placed on beta-blocker due to borderline bradycardia.  Repeat limited echo was obtained which showed EF 45 to 50%, RV systolic function was not well-visualized, borderline dilated ascending aorta measuring 37 mm.  No ischemic evaluation was pursued given lack of chest pain.  Patient was last seen in clinic on 02/11/2023  by Dr. Jens Som.  Patient had remained stable from a cardiac standpoint.  Echocardiogram on 10/15/2023 indicated LVEF of 35 to 40%, global hypokinesis, mild concentric LVH, grade 1 diastolic dysfunction, RV systolic function was normal, RV size was mildly enlarged.  There was mild to moderate aortic valve regurgitation, no aortic valve stenosis was present.  There was moderate dilation of the aortic root measuring 44 mm.  Today he presents for follow-up.  He reports that he has been doing well.  He denies any chest pain, shortness of breath, lower extremity edema, orthopnea or PND.  He remains overall active at home, notes that his son and his daughter assist him with his doctors appointments.  His neighbor also assists him with his medications.  Patient noted some slight dizziness this morning that resolved after few seconds.  Patient reports that his weights have overall been stable at home.  ROS: .   Today he denies chest pain, shortness of breath, lower extremity edema, fatigue, palpitations, melena, hematuria, hemoptysis, diaphoresis, weakness, presyncope, syncope, orthopnea, and PND.  All other systems are reviewed and otherwise negative. Studies Reviewed: Marland Kitchen   EKG:  EKG is ordered today, personally reviewed, demonstrating  EKG Interpretation Date/Time:  Friday December 13 2023 15:59:28 EDT Ventricular Rate:  57 PR Interval:  210 QRS Duration:  100 QT Interval:  400 QTC Calculation: 389 R Axis:   -39  Text Interpretation: Sinus bradycardia with 1st degree A-V block Left axis deviation Low voltage QRS Incomplete right bundle branch block  Confirmed by Reather Littler [phone]) on 12/13/2023 5:08:30 PM   CV Studies: Cardiac studies reviewed are outlined and summarized above. Otherwise please see EMR for full report. Cardiac Studies & Procedures   ______________________________________________________________________________________________   STRESS TESTS  MYOCARDIAL PERFUSION IMAGING  05/02/2020  Narrative  There was no ST segment deviation noted during stress.  Nuclear stress EF: 54%. No wall motion abnormalities  The study is normal.  This is a low risk study. No ischemia or infarct identified.   ECHOCARDIOGRAM  ECHOCARDIOGRAM COMPLETE 10/15/2023  Narrative ECHOCARDIOGRAM REPORT    Patient Name:   [name] Date of Exam: 10/15/2023 Medical Rec #:  [id number]       Height:       66.0 in Accession #:    [id number]      Weight:       207.2 lb Date of Birth:  14-Sep-1941       BSA:          2.030 m Patient Age:    82 years        BP:           148/56 mmHg Patient Gender: M               HR:           57 bpm. Exam Location:  [address]  Procedure: 2D Echo, Cardiac Doppler and Color Doppler  Indications:    I25.10 CAD  History:        Patient has prior history of Echocardiogram examinations, most recent 07/08/2022. CAD, COPD, AI, Signs/Symptoms:Dilated aortic root; Risk Factors:Hypertension and Dyslipidemia.  Sonographer:    Samule Ohm RDCS Referring Phys: [id number] BRIAN S CRENSHAW  IMPRESSIONS   1. Left ventricular ejection fraction, by estimation, is 35 to 40%. The left ventricle has moderately decreased function. The left ventricle demonstrates global hypokinesis. There is mild concentric left ventricular hypertrophy. Left ventricular diastolic parameters are consistent with Grade I diastolic dysfunction (impaired relaxation). 2. Right ventricular systolic function is normal. The right ventricular size is mildly enlarged. There is normal pulmonary artery systolic pressure. The estimated right ventricular systolic pressure is 25.8 mmHg. 3. The mitral valve is normal in structure. Mild mitral valve regurgitation. No evidence of mitral stenosis. 4. The aortic valve is tricuspid. There is mild calcification of the aortic valve. Aortic valve regurgitation is mild to moderate. No aortic stenosis is present. 5. Aortic dilatation noted. There is moderate  dilatation of the aortic root, measuring 44 mm. 6. The inferior vena cava is normal in size with greater than 50% respiratory variability, suggesting right atrial pressure of 3 mmHg.  FINDINGS Left Ventricle: Left ventricular ejection fraction, by estimation, is 35 to 40%. The left ventricle has moderately decreased function. The left ventricle demonstrates global hypokinesis. The left ventricular internal cavity size was normal in size. There is mild concentric left ventricular hypertrophy. Left ventricular diastolic parameters are consistent with Grade I diastolic dysfunction (impaired relaxation).  Right Ventricle: The right ventricular size is mildly enlarged. No increase in right ventricular wall thickness. Right ventricular systolic function is normal. There is normal pulmonary artery systolic pressure. The tricuspid regurgitant velocity is 2.39 m/s, and with an assumed right atrial pressure of 3 mmHg, the estimated right ventricular systolic pressure is 25.8 mmHg.  Left Atrium: Left atrial size was normal in size.  Right Atrium: Right atrial size was normal in size.  Pericardium: There is no evidence of pericardial effusion.  Mitral Valve: The mitral valve is  normal in structure. Mild mitral valve regurgitation. No evidence of mitral valve stenosis.  Tricuspid Valve: The tricuspid valve is normal in structure. Tricuspid valve regurgitation is trivial.  Aortic Valve: The aortic valve is tricuspid. There is mild calcification of the aortic valve. Aortic valve regurgitation is mild to moderate. Aortic regurgitation PHT measures 459 msec. No aortic stenosis is present.  Pulmonic Valve: The pulmonic valve was normal in structure. Pulmonic valve regurgitation is trivial.  Aorta: Aortic dilatation noted. There is moderate dilatation of the aortic root, measuring 44 mm.  Venous: The inferior vena cava is normal in size with greater than 50% respiratory variability, suggesting right atrial  pressure of 3 mmHg.  IAS/Shunts: No atrial level shunt detected by color flow Doppler.   LEFT VENTRICLE PLAX 2D LVIDd:         5.40 cm   Diastology LVIDs:         3.95 cm   LV e' medial:    4.68 cm/s LV PW:         1.30 cm   LV E/e' medial:  13.4 LV IVS:        1.30 cm   LV e' lateral:   5.66 cm/s LVOT diam:     2.30 cm   LV E/e' lateral: 11.1 LV SV:         89 LV SV Index:   44 LVOT Area:     4.15 cm   RIGHT VENTRICLE             IVC RV S prime:     20.00 cm/s  IVC diam: 0.90 cm TAPSE (M-mode): 2.2 cm RVSP:           25.8 mmHg  LEFT ATRIUM             Index        RIGHT ATRIUM           Index LA diam:        4.40 cm 2.17 cm/m   RA Pressure: 3.00 mmHg LA Vol (A2C):   78.9 ml 38.87 ml/m  RA Area:     17.20 cm LA Vol (A4C):   56.5 ml 27.83 ml/m  RA Volume:   48.10 ml  23.69 ml/m LA Biplane Vol: 66.7 ml 32.86 ml/m AORTIC VALVE LVOT Vmax:   83.00 cm/s LVOT Vmean:  55.800 cm/s LVOT VTI:    0.214 m AI PHT:      459 msec  AORTA Ao Root diam: 4.40 cm Ao Asc diam:  3.70 cm  MITRAL VALVE               TRICUSPID VALVE MV Area (PHT): 2.96 cm    TR Peak grad:   22.8 mmHg MV Decel Time: 256 msec    TR Vmax:        239.00 cm/s MV E velocity: 62.90 cm/s  Estimated RAP:  3.00 mmHg MV A velocity: 93.80 cm/s  RVSP:           25.8 mmHg MV E/A ratio:  0.67 SHUNTS Systemic VTI:  0.21 m Systemic Diam: 2.30 cm  Dalton McleanMD Electronically signed by Wilfred Lacy Signature Date/Time: 10/15/2023/3:14:44 PM    Final          ______________________________________________________________________________________________       Current Reported Medications:.    Current Meds  Medication Sig   aspirin EC 81 MG tablet Take 1 tablet (81 mg total) by mouth daily.   cromolyn (NASALCROM) 5.2 MG/ACT nasal  spray Place 1 spray into both nostrils 3 (three) times daily.   docusate sodium (COLACE) 100 MG capsule Take 100 mg by mouth daily.   etodolac (LODINE) 400 MG tablet Take  400 mg by mouth 2 (two) times daily.   furosemide (LASIX) 20 MG tablet Take 1 tablet (20 mg total) by mouth daily.   hydrocortisone 2.5 % cream Apply topically 2 (two) times daily as needed.   LORazepam (ATIVAN) 1 MG tablet Take 1 mg by mouth See admin instructions. Take 1/2 to 1 tablet by mouth every evening   metoprolol succinate (TOPROL XL) 25 MG 24 hr tablet Take 0.5 tablets (12.5 mg total) by mouth daily.   mirabegron ER (MYRBETRIQ) 25 MG TB24 tablet Take 1 tablet (25 mg total) by mouth daily.   montelukast (SINGULAIR) 10 MG tablet Take 10 mg by mouth at bedtime.   nitroGLYCERIN (NITROSTAT) 0.4 MG SL tablet Place 1 tablet (0.4 mg total) under the tongue every 5 (five) minutes as needed for chest pain.   nystatin (MYCOSTATIN/NYSTOP) powder Apply 1 Application topically 2 (two) times daily.   pantoprazole (PROTONIX) 40 MG tablet Take 40 mg by mouth 2 (two) times daily.   polyvinyl alcohol (LIQUIFILM TEARS) 1.4 % ophthalmic solution Place 1 drop into both eyes as needed for dry eyes.    Potassium Chloride ER 20 MEQ TBCR Take 20 mEq by mouth 2 (two) times daily.   rosuvastatin (CRESTOR) 20 MG tablet Take 1 tablet (20 mg total) by mouth daily.   sacubitril-valsartan (ENTRESTO) 24-26 MG Take 1 tablet by mouth 2 (two) times daily.   tamsulosin (FLOMAX) 0.4 MG CAPS capsule Take 0.4 mg by mouth daily.    [DISCONTINUED] furosemide (LASIX) 40 MG tablet Take 40 mg by mouth daily.   [DISCONTINUED] metoprolol succinate (TOPROL XL) 25 MG 24 hr tablet Take 1 tablet (25 mg total) by mouth daily.   Physical Exam:    VS:  BP (!) 96/50   Pulse 66   Ht 5\' 5"  (1.651 m)   Wt 196 lb (88.9 kg)   SpO2 96%   BMI 32.62 kg/m    Wt Readings from Last 3 Encounters:  12/13/23 196 lb (88.9 kg)  02/11/23 207 lb 3.2 oz (94 kg)  08/28/22 194 lb 6.4 oz (88.2 kg)    GEN: Well nourished, well developed in no acute distress NECK: No JVD; No carotid bruits CARDIAC: RRR, no murmurs, rubs, gallops RESPIRATORY:  Clear  to auscultation without rales, wheezing or rhonchi  ABDOMEN: Soft, non-tender, non-distended EXTREMITIES:  No edema; No acute deformity     Asessement and Plan:.    HFrEF/aortic insufficiency: Echocardiogram on 10/15/2023 indicated LVEF of 35 to 40%, global hypokinesis, mild concentric LVH, grade 1 diastolic dysfunction, RV systolic function was normal, RV size was mildly enlarged.  There was mild to moderate aortic valve regurgitation, no aortic valve stenosis was present.  Patient was then started on Entresto 24-26 mg twice daily and metoprolol succinate 25 mg daily. Blood pressure is soft today in office.  Patient notes he had some slight dizziness earlier today that resolved quickly.  He notes he is not typically dizzy or lightheaded.  Denies any presyncope or syncope.  Discussed ischemic evaluation with patient, he deferred for now as he denies any symptoms. He denies any problems with lower extremity edema or shortness of breath, will decrease his Lasix to 20 mg daily and decrease his metoprolol to 12.5 mg daily.  Encouraged patient to monitor his blood pressure at  home and record on log provided, to bring on follow-up.  Reviewed ED precautions. Check CBC and BMET.   CAD: History of PCI of his LAD in December 2014, residual 60% mid RCA.  Nuclear study in August 2021 showed EF 54% and no ischemia or infarction.  Patient denies any anginal symptoms today.  Given reduction in EF on echo in January will need to consider ischemic evaluation, patient deferred at this time.  Reviewed ED precautions. Continue aspirin 81 mg daily, Lasix, metoprolol succinate, Crestor 20 mg daily and Entresto.   History of thoracic aortic aneurysm/: CTA in 08/2018 showed 4.5 cm T AAA.  CTA December 2020 showed dilation of the aortic root at 4.4 cm.  Echo in January 2025 indicated aortic root dilation at 44 mm.  Check CTA chest/aorta.  Check BMET today.   Hypertension: Initial blood pressure today 90/48, on recheck was 96/50.   Patient reports that he is overall asymptomatic, noted some slight dizziness this morning that resolved quickly.  Will reduce Lasix to 20 mg daily and decrease metoprolol to 12.5 mg daily.  Patient to monitor his blood pressure at home and bring log to follow-up.  Continue Entresto 24-26 mg twice daily.  Hyperlipidemia: Patient without recent lipid profile on file.  Consider on follow-up.  On Crestor 20 mg daily.    Disposition: F/u with APP in one week.   Signed, [name], NP

## 2023-12-13 ENCOUNTER — Ambulatory Visit: Payer: No Typology Code available for payment source | Attending: Cardiology | Admitting: Cardiology

## 2023-12-13 VITALS — BP 96/50 | HR 66 | Ht 65.0 in | Wt 196.0 lb

## 2023-12-13 DIAGNOSIS — I351 Nonrheumatic aortic (valve) insufficiency: Secondary | ICD-10-CM

## 2023-12-13 DIAGNOSIS — I10 Essential (primary) hypertension: Secondary | ICD-10-CM | POA: Diagnosis not present

## 2023-12-13 DIAGNOSIS — I77810 Thoracic aortic ectasia: Secondary | ICD-10-CM

## 2023-12-13 DIAGNOSIS — E785 Hyperlipidemia, unspecified: Secondary | ICD-10-CM

## 2023-12-13 DIAGNOSIS — I5020 Unspecified systolic (congestive) heart failure: Secondary | ICD-10-CM

## 2023-12-13 DIAGNOSIS — I2510 Atherosclerotic heart disease of native coronary artery without angina pectoris: Secondary | ICD-10-CM | POA: Diagnosis not present

## 2023-12-13 MED ORDER — FUROSEMIDE 20 MG PO TABS: 20.0000 mg | ORAL_TABLET | Freq: Every day | ORAL | 3 refills | Status: DC

## 2023-12-13 MED ORDER — METOPROLOL SUCCINATE ER 25 MG PO TB24: 12.5000 mg | ORAL_TABLET | Freq: Every day | ORAL | 3 refills | Status: DC

## 2023-12-13 NOTE — Patient Instructions (Addendum)
 Medication Instructions:  Decrease Metoprolol Succinate Er to 12.5 mg (1/2 tablet) once a day Decrease Lasix to 20 mg once a day Hold Entresto for tonight and tomorrow morning then restart tomorrow night *If you need a refill on your cardiac medications before your next appointment, please call your pharmacy*  Lab Work: Today we drew a CBC and Bmet. If you have labs (blood work) drawn today and your tests are completely normal, you will receive your results only by: MyChart Message (if you have MyChart) OR A paper copy in the mail If you have any lab test that is abnormal or we need to change your treatment, we will call you to review the results.  Testing/Procedures: We will need to get you scheduled for a CTA Chest Aorta  Follow-Up: At [facility], you and your health needs are our priority.  As part of our continuing mission to provide you with exceptional heart care, we have created designated Provider Care Teams.  These Care Teams include your primary Cardiologist (physician) and Advanced Practice Providers (APPs -  Physician Assistants and Nurse Practitioners) who all work together to provide you with the care you need, when you need it.  We recommend signing up for the patient portal called "MyChart".  Sign up information is provided on this After Visit Summary.  MyChart is used to connect with patients for Virtual Visits (Telemedicine).  Patients are able to view lab/test results, encounter notes, upcoming appointments, etc.  Non-urgent messages can be sent to your provider as well.   To learn more about what you can do with MyChart, go to [email or web address].    Your next appointment:   1 week(s)  Provider:   Any APP, Then, Olga Millers, MD will plan to see you again next available.  Other Instructions: Keep a weight log, measure first thing in the morning after going to the restroom. Please take your blood pressure daily for week and bring into next  appointment. Please include heart rates. (One message at the end of the 2 weeks).  Bring blood pressure cuff to a appointment.  HOW TO TAKE YOUR BLOOD PRESSURE: Rest 5 minutes before taking your blood pressure. Don't smoke or drink caffeinated beverages for at least 30 minutes before. Take your blood pressure before (not after) you eat. Sit comfortably with your back supported and both feet on the floor (don't cross your legs). Elevate your arm to heart level on a table or a desk. Use the proper sized cuff. It should fit smoothly and snugly around your bare upper arm. There should be enough room to slip a fingertip under the cuff. The bottom edge of the cuff should be 1 inch above the crease of the elbow. Ideally, take 3 measurements at one sitting and record the average.

## 2023-12-14 ENCOUNTER — Encounter: Payer: Self-pay | Admitting: Cardiology

## 2023-12-14 LAB — BASIC METABOLIC PANEL
BUN/Creatinine Ratio: 16 (ref 10–24)
BUN: 20 mg/dL (ref 8–27)
CO2: 26 mmol/L (ref 20–29)
Calcium: 9.2 mg/dL (ref 8.6–10.2)
Chloride: 103 mmol/L (ref 96–106)
Creatinine, Ser: 1.23 mg/dL (ref 0.76–1.27)
Glucose: 120 mg/dL — ABNORMAL HIGH (ref 70–99)
Potassium: 4.4 mmol/L (ref 3.5–5.2)
Sodium: 141 mmol/L (ref 134–144)
eGFR: 58 mL/min/{1.73_m2} — ABNORMAL LOW (ref >59)

## 2023-12-14 LAB — CBC
Hematocrit: 39.5 % (ref 37.5–51.0)
Hemoglobin: 13.6 g/dL (ref 13.0–17.7)
MCH: 30.6 pg (ref 26.6–33.0)
MCHC: 34.4 g/dL (ref 31.5–35.7)
MCV: 89 fL (ref 79–97)
Platelets: 285 10*3/uL (ref 150–450)
RBC: 4.45 x10E6/uL (ref 4.14–5.80)
RDW: 13.4 % (ref 11.6–15.4)
WBC: 5.8 10*3/uL (ref 3.4–10.8)

## 2023-12-18 DIAGNOSIS — I5032 Chronic diastolic (congestive) heart failure: Secondary | ICD-10-CM | POA: Diagnosis not present

## 2023-12-18 DIAGNOSIS — I700 Atherosclerosis of aorta: Secondary | ICD-10-CM | POA: Diagnosis not present

## 2023-12-18 DIAGNOSIS — J449 Chronic obstructive pulmonary disease, unspecified: Secondary | ICD-10-CM | POA: Diagnosis not present

## 2023-12-18 DIAGNOSIS — I10 Essential (primary) hypertension: Secondary | ICD-10-CM | POA: Diagnosis not present

## 2023-12-18 DIAGNOSIS — Z7189 Other specified counseling: Secondary | ICD-10-CM | POA: Diagnosis not present

## 2023-12-18 DIAGNOSIS — Z0000 Encounter for general adult medical examination without abnormal findings: Secondary | ICD-10-CM | POA: Diagnosis not present

## 2023-12-18 DIAGNOSIS — Z299 Encounter for prophylactic measures, unspecified: Secondary | ICD-10-CM | POA: Diagnosis not present

## 2023-12-20 ENCOUNTER — Encounter: Payer: Self-pay | Admitting: Emergency Medicine

## 2023-12-20 ENCOUNTER — Ambulatory Visit: Attending: Emergency Medicine | Admitting: Emergency Medicine

## 2023-12-20 ENCOUNTER — Telehealth: Payer: Self-pay

## 2023-12-20 VITALS — BP 96/50 | HR 55 | Ht 65.0 in | Wt 198.0 lb

## 2023-12-20 DIAGNOSIS — I5020 Unspecified systolic (congestive) heart failure: Secondary | ICD-10-CM | POA: Diagnosis not present

## 2023-12-20 DIAGNOSIS — I351 Nonrheumatic aortic (valve) insufficiency: Secondary | ICD-10-CM

## 2023-12-20 DIAGNOSIS — I10 Essential (primary) hypertension: Secondary | ICD-10-CM | POA: Diagnosis not present

## 2023-12-20 DIAGNOSIS — I712 Thoracic aortic aneurysm, without rupture, unspecified: Secondary | ICD-10-CM | POA: Diagnosis not present

## 2023-12-20 DIAGNOSIS — E7800 Pure hypercholesterolemia, unspecified: Secondary | ICD-10-CM

## 2023-12-20 DIAGNOSIS — I2510 Atherosclerotic heart disease of native coronary artery without angina pectoris: Secondary | ICD-10-CM | POA: Diagnosis not present

## 2023-12-20 DIAGNOSIS — I959 Hypotension, unspecified: Secondary | ICD-10-CM

## 2023-12-20 MED ORDER — LOSARTAN POTASSIUM 25 MG PO TABS: 25.0000 mg | ORAL_TABLET | Freq: Every day | ORAL | 1 refills | Status: DC

## 2023-12-20 NOTE — Telephone Encounter (Signed)
 Reviewed on 12/20/23 Appointment.

## 2023-12-20 NOTE — Progress Notes (Signed)
 Order(s) created erroneously. Erroneous order ID: [id number]  Order moved by: CHART CORRECTION ANALYST SEVEN, IDENTITY  Order move date/time: 12/20/2023 11:12 AM  Source Patient: [id number]  Source Contact: 12/20/2023  Destination Patient: [id number]  Destination Contact: 08/03/2021

## 2023-12-20 NOTE — Telephone Encounter (Signed)
-----   Message from [name] sent at 12/18/2023 12:17 PM EDT ----- Please let [name] know that his kidney function is overall stable, labs indicates he is slightly dry, this should improve with decreased lasix. His electrolytes are normal. His CBC shows no evidence of anemia or infection. Follow up with Rise Paganini, NP on Friday as planned.

## 2023-12-20 NOTE — Patient Instructions (Addendum)
 Medication Instructions:  STOP TAKING ENTRESTO.  START TAKING LOSARTAN 25 MG DAILY.   Lab Work: NONE   Testing/Procedures: NONE   Follow-Up: At [facility], you and your health needs are our priority.  As part of our continuing mission to provide you with exceptional heart care, we have created designated Provider Care Teams.  These Care Teams include your primary Cardiologist (physician) and Advanced Practice Providers (APPs -  Physician Assistants and Nurse Practitioners) who all work together to provide you with the care you need, when you need it.  We recommend signing up for the patient portal called "MyChart".  Sign up information is provided on this After Visit Summary.  MyChart is used to connect with patients for Virtual Visits (Telemedicine).  Patients are able to view lab/test results, encounter notes, upcoming appointments, etc.  Non-urgent messages can be sent to your provider as well.   To learn more about what you can do with MyChart, go to [email or web address].    Your next appointment:   1 month(s)  Provider:   Rise Paganini, DNP  Other Instructions:  CHECK YOUR HEART RATE DAILY.

## 2023-12-20 NOTE — Progress Notes (Signed)
 Cardiology Office Note:    Date:  12/20/2023  ID:  [name], DOB 04-08-1941, MRN [id number] PCP: Ignatius Specking, MD  [facility] Providers Cardiologist:  Olga Millers, MD       Patient Profile:      Chief Complaint: 1 week follow-up for hypotension  History of Present Illness:  [name] is a 83 y.o. male with visit-pertinent history of coronary artery disease, abdominal aortic aneurysm, COPD, hypertension, hyperlipidemia, history of laryngeal cancer s/p radiation therapy  In 2008 patient had PCI of OM2/OM 3. Patient had PCI of his LAD in December 2014 and had a 60% residual mid RCA lesion. Echocardiogram in July 2017 showed normal EF, mild diastolic dysfunction, mild aortic and mitral regurgitation, mildly dilated aortic root measuring at 4.2 cm, moderate LAE. Myoview in November 2019 showed EF 54%, no ischemia. CTA obtained in November 2019 showed 4.5 cm thoracic aortic aneurysm, abdominal aorta measuring 2.5 cm. CTA in December 2020 showed dilation of the aortic root at 4.4 cm. Myoview obtained in 05/2020 was normal with no ischemia or infarction, EF 54%. In March 2023 patient was seen in office with lightheadedness and dependent bilateral lower extremity edema. 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 In September 2023 patient became weak and lost his appetite following COVID infection. He was hospitalized due to fall found to have elevated troponin in the 60s however serial troponin was flat. Echo showed EF 45-50% with global hypokinesis. Patient denied any chest pain, he was not placed on beta-blocker due to borderline bradycardia. Repeat limited echo was obtained which showed EF 45 to 50%, RV systolic function was not well-visualized, borderline dilated ascending aorta measuring 37 mm. No ischemic evaluation was pursued given lack of  chest pain   He was seen in clinic on 02/11/2023 by Dr. Jens Som.  Patient was without any cardiovascular concerns or complaints at that time.  Echocardiogram on 10/15/2023 indicated LVEF of 35-40%, global hypokinesis, mild concentric LVH, grade 1 diastolic dysfunction, RV systolic function normal, RV size mildly enlarged, mild to moderate aortic valve regurgitation, no aortic valve stenosis, moderate dilation of aortic root measuring 44 mm.  GDMT was initiated, his amlodipine and losartan were discontinued he was started on Entresto 24-26 and metoprolol 25 mg.  Last seen in clinic on 12/13/2023.  Patient noted to have some slight dizziness.  He was hypotensive in office with a blood pressure 96/50.  His Lasix was decreased to 20 mg daily and metoprolol was decreased to 12.5 mg daily.  Ischemic evaluation was considered however deferred by the patient.  He was to follow-up in 1 week.   Discussed the use of AI scribe software for clinical note transcription with the patient, who gave verbal consent to proceed.  Today the patient comes into the office today with his son, presented for a follow-up visit after a recent adjustment in his medication regimen due to low blood pressure and lightheadedness. The patient reported a slight improvement in his blood pressure readings and no further episodes of lightheadedness since the adjustment.  He notes his only symptom is generalized fatigue.  He brings his blood pressure log into the office today which showed blood pressure range 90s-100s systolic.  Average BP 98 systolic over 40s diastolic.  Interestingly, he overall remains entirely asymptomatic.  He is without any chest pains, dyspnea, dizziness,  palpitations, lightheadedness, syncope/presyncope, orthopnea, or PND.  He has had no falls.  He notes his leg swelling remains mild with no worsening after decreasing his Lasix dose.  Patient is overall very active individual for an 83 year old.  He notes that he walks at  least 1 mile a day and usually up to 3.  During these walks he is without any exertional anginal symptoms.    Review of systems:  Please see the history of present illness. All other systems are reviewed and otherwise negative.     Home Medications:    Current Meds  Medication Sig   aspirin EC 81 MG tablet Take 1 tablet (81 mg total) by mouth daily.   cromolyn (NASALCROM) 5.2 MG/ACT nasal spray Place 1 spray into both nostrils 3 (three) times daily.   docusate sodium (COLACE) 100 MG capsule Take 100 mg by mouth daily.   etodolac (LODINE) 400 MG tablet Take 400 mg by mouth 2 (two) times daily.   furosemide (LASIX) 20 MG tablet Take 1 tablet (20 mg total) by mouth daily.   hydrocortisone 2.5 % cream Apply topically 2 (two) times daily as needed.   LORazepam (ATIVAN) 1 MG tablet Take 1 mg by mouth See admin instructions. Take 1/2 to 1 tablet by mouth every evening   losartan (COZAAR) 25 MG tablet Take 1 tablet (25 mg total) by mouth daily.   metoprolol succinate (TOPROL XL) 25 MG 24 hr tablet Take 0.5 tablets (12.5 mg total) by mouth daily.   mirabegron ER (MYRBETRIQ) 25 MG TB24 tablet Take 1 tablet (25 mg total) by mouth daily.   montelukast (SINGULAIR) 10 MG tablet Take 10 mg by mouth at bedtime.   nitroGLYCERIN (NITROSTAT) 0.4 MG SL tablet Place 1 tablet (0.4 mg total) under the tongue every 5 (five) minutes as needed for chest pain.   nystatin (MYCOSTATIN/NYSTOP) powder Apply 1 Application topically 2 (two) times daily.   pantoprazole (PROTONIX) 40 MG tablet Take 40 mg by mouth 2 (two) times daily.   polyvinyl alcohol (LIQUIFILM TEARS) 1.4 % ophthalmic solution Place 1 drop into both eyes as needed for dry eyes.    Potassium Chloride ER 20 MEQ TBCR Take 20 mEq by mouth 2 (two) times daily.   rosuvastatin (CRESTOR) 20 MG tablet Take 1 tablet (20 mg total) by mouth daily.   tamsulosin (FLOMAX) 0.4 MG CAPS capsule Take 0.4 mg by mouth daily.    [DISCONTINUED] sacubitril-valsartan (ENTRESTO)  24-26 MG Take 1 tablet by mouth 2 (two) times daily.   Studies Reviewed:       Echocardiogram 10/15/2023 1. Left ventricular ejection fraction, by estimation, is 35 to 40%. The  left ventricle has moderately decreased function. The left ventricle  demonstrates global hypokinesis. There is mild concentric left ventricular  hypertrophy. Left ventricular  diastolic parameters are consistent with Grade I diastolic dysfunction  (impaired relaxation).   2. Right ventricular systolic function is normal. The right ventricular  size is mildly enlarged. There is normal pulmonary artery systolic  pressure. The estimated right ventricular systolic pressure is 25.8 mmHg.   3. The mitral valve is normal in structure. Mild mitral valve  regurgitation. No evidence of mitral stenosis.   4. The aortic valve is tricuspid. There is mild calcification of the  aortic valve. Aortic valve regurgitation is mild to moderate. No aortic  stenosis is present.   5. Aortic dilatation noted. There is moderate dilatation of the aortic  root, measuring 44 mm.   6. The inferior vena  cava is normal in size with greater than 50%  respiratory variability, suggesting right atrial pressure of 3 mmHg.   Lexiscan stress test 05/02/2020 There was no ST segment deviation noted during stress. Nuclear stress EF: 54%. No wall motion abnormalities The study is normal. This is a low risk study. No ischemia or infarct identified.  Risk Assessment/Calculations:             Physical Exam:   VS:  BP (!) 96/50 (BP Location: Right Arm, Patient Position: Sitting, Cuff Size: Normal)   Pulse (!) 55   Ht 5\' 5"  (1.651 m)   Wt 198 lb (89.8 kg)   BMI 32.95 kg/m    Wt Readings from Last 3 Encounters:  12/20/23 198 lb (89.8 kg)  12/13/23 196 lb (88.9 kg)  02/11/23 207 lb 3.2 oz (94 kg)    GEN: Well nourished, well developed in no acute distress NECK: No JVD; No carotid bruits CARDIAC: RRR, no murmurs, rubs, gallops RESPIRATORY:   Clear to auscultation without rales, wheezing or rhonchi  ABDOMEN: Soft, non-tender, non-distended EXTREMITIES:  No edema; No acute deformity     Assessment and Plan:  HFrEF / Hypotension Echocardiogram on 10/15/2023 indicated LVEF of 35 to 40%, global hypokinesis, mild concentric LVH, grade 1 diastolic dysfunction (previously 45-50% in 2023) -1 week ago Lasix was decreased to 20 mg daily and metoprolol was decreased to 12.5 mg daily in the setting of hypotension (96/50) -Today's blood pressure is 90/44 and repeat 96/50.  He is without any overt heart failure symptoms.  He only complains of fatigue.  He denies any presyncope/syncope, dyspnea, chest pain, orthopnea, PND, lightheadedness/dizziness. -Given his age of 25 and his increased risk of falls given hypotensive blood pressures, even though he is asymptomatic at this time, I will discontinue Entresto and reinitiate losartan 25 mg daily -He will follow-up in 1 month with blood pressure log and contact office for blood pressure less than 90 or greater than 140 systolic. -We had long discussion regarding ischemic evaluation today given new mildly decreased LVEF.  Given patient is overall asymptomatic and walking around 3 miles a day, he has deferred ischemic testing at this time and has opted to have echocardiogram repeated after x 3 months of maximally tolerated GDMT -Plan: Start losartan 25 mg daily.  Discontinue Entresto. -Continue Lasix 20 mg daily, metoprolol XL 12.5 mg daily -Daily weights and daily blood pressure checks  Aortic insufficiency Echocardiogram 10/2023 showed mild to moderate aortic valve regurgitation, no aortic valve stenosis was present -Overall asymptomatic, can repeat echocardiogram in 1 year or when clinically indicated  Coronary artery disease H/o PCI of his LAD in December 2014, residual 60% mid RCA.  Nuclear study in August 2021 showed EF 54% and no ischemia or infarction.   Given mild reduction in EF on echo in  January will need to consider ischemic evaluation, patient deferred at this time as noted above. -Today he is without any anginal symptoms and continues to be very active without any exertional angina nor dyspnea -EKG today showed sinus bradycardia without acute ST or T wave changes and no changes from prior tracing -Continue aspirin 81 mg daily, Lasix 20 mg daily, metoprolol XL 12.5 mg daily, rosuvastatin 20 mg daily   History of thoracic aortic aneurysm CTA in 08/2018 showed 4.5 cm thoracic AAA.  CTA December 2020 showed dilation of the aortic root at 4.4 cm.  Echo in January 2025 indicated aortic root dilation at 44 mm  -CT angio chest aorta  has been ordered and currently pending   Hypertension ----> Hypotension Blood pressure today is 90/44 repeat 96/50 Overall he is asymptomatic as noted above -Discontinue Entresto.  Start losartan 25 mg daily -Continue metoprolol XL 12.5 mg daily  Hyperlipidemia Last LDL 116 on 01/2023 Has been managed by PCP -During follow-up visit recommend repeat fasting lipid panel -Continue rosuvastatin 20 mg daily            Dispo:  Return in about 1 month (around 01/20/2024).  Signed, Denyce Robert, NP

## 2024-01-22 ENCOUNTER — Ambulatory Visit: Attending: Emergency Medicine | Admitting: Emergency Medicine

## 2024-01-22 ENCOUNTER — Encounter: Payer: Self-pay | Admitting: Emergency Medicine

## 2024-01-22 VITALS — BP 102/52 | HR 67 | Ht 66.0 in | Wt 192.6 lb

## 2024-01-22 DIAGNOSIS — I351 Nonrheumatic aortic (valve) insufficiency: Secondary | ICD-10-CM | POA: Diagnosis not present

## 2024-01-22 DIAGNOSIS — I5020 Unspecified systolic (congestive) heart failure: Secondary | ICD-10-CM

## 2024-01-22 DIAGNOSIS — I712 Thoracic aortic aneurysm, without rupture, unspecified: Secondary | ICD-10-CM | POA: Diagnosis not present

## 2024-01-22 DIAGNOSIS — I959 Hypotension, unspecified: Secondary | ICD-10-CM | POA: Diagnosis not present

## 2024-01-22 DIAGNOSIS — I2510 Atherosclerotic heart disease of native coronary artery without angina pectoris: Secondary | ICD-10-CM

## 2024-01-22 DIAGNOSIS — E785 Hyperlipidemia, unspecified: Secondary | ICD-10-CM

## 2024-01-22 DIAGNOSIS — I10 Essential (primary) hypertension: Secondary | ICD-10-CM

## 2024-01-22 NOTE — Patient Instructions (Addendum)
 Medication Instructions:  NO CHANGES   Lab Work: BMET AND CBC TO BE DONE TODAY.   Testing/Procedures:    Please report to Radiology at the [facility] Main Entrance 30 minutes early for your test.  [address] [place], [place] [address]                      How to Prepare for Your Cardiac PET/CT Stress Test:  Nothing to eat or drink, except water, 3 hours prior to arrival time.  NO caffeine/decaffeinated products, or chocolate 12 hours prior to arrival. (Please note decaffeinated beverages (teas/coffees) still contain caffeine).  If you have caffeine within 12 hours prior, the test will need to be rescheduled.  Medication instructions: Do not take erectile dysfunction medications for 72 hours prior to test (sildenafil, tadalafil) Do not take nitrates (isosorbide  mononitrate, Ranexa) the day before or day of test Do not take tamsulosin  the day before or morning of test Hold theophylline containing medications for 12 hours. Hold Dipyridamole 48 hours prior to the test.  Diabetic Preparation: If able to eat breakfast prior to 3 hour fasting, you may take all medications, including your insulin. Do not worry if you miss your breakfast dose of insulin - start at your next meal. If you do not eat prior to 3 hour fast-Hold all diabetes (oral and insulin) medications. Patients who wear a continuous glucose monitor MUST remove the device prior to scanning.  You may take your remaining medications with water.  NO perfume, cologne or lotion on chest or abdomen area. FEMALES - Please avoid wearing dresses to this appointment.  Total time is 1 to 2 hours; you may want to bring reading material for the waiting time.  IF YOU THINK YOU MAY BE PREGNANT, OR ARE NURSING PLEASE INFORM THE TECHNOLOGIST.  In preparation for your appointment, medication and supplies will be purchased.  Appointment availability is limited, so if you need to cancel or reschedule, please call the  Radiology Department Scheduler at [phone] 24 hours in advance to avoid a cancellation fee of $100.00  What to Expect When you Arrive:  Once you arrive and check in for your appointment, you will be taken to a preparation room within the Radiology Department.  A technologist or Nurse will obtain your medical history, verify that you are correctly prepped for the exam, and explain the procedure.  Afterwards, an IV will be started in your arm and electrodes will be placed on your skin for EKG monitoring during the stress portion of the exam. Then you will be escorted to the PET/CT scanner.  There, staff will get you positioned on the scanner and obtain a blood pressure and EKG.  During the exam, you will continue to be connected to the EKG and blood pressure machines.  A small, safe amount of a radioactive tracer will be injected in your IV to obtain a series of pictures of your heart along with an injection of a stress agent.    After your Exam:  It is recommended that you eat a meal and drink a caffeinated beverage to counter act any effects of the stress agent.  Drink plenty of fluids for the remainder of the day and urinate frequently for the first couple of hours after the exam.  Your doctor will inform you of your test results within 7-10 business days.  For more information and frequently asked questions, please visit our website: [email or web address]  For questions about your test  or how to prepare for your test, please call: Cardiac Imaging Nurse Navigators Office: [phone]   Follow-Up: At [facility], you and your health needs are our priority.  As part of our continuing mission to provide you with exceptional heart care, our providers are all part of one team.  This team includes your primary Cardiologist (physician) and Advanced Practice Providers or APPs (Physician Assistants and Nurse Practitioners) who all work together to provide you with the care you  need, when you need it.  Your next appointment:   6-8 WEEKS POST CARDIAC PET STRESS TEST  Provider:   MADISON FOUNTAIN, DNP  We recommend signing up for the patient portal called "MyChart".  Sign up information is provided on this After Visit Summary.  MyChart is used to connect with patients for Virtual Visits (Telemedicine).  Patients are able to view lab/test results, encounter notes, upcoming appointments, etc.  Non-urgent messages can be sent to your provider as well.   To learn more about what you can do with MyChart, go to [email or web address].   Other Instructions:    1st Floor: - Lobby - Registration  - Pharmacy  - Lab - Cafe  2nd Floor: - PV Lab - Diagnostic Testing (echo, CT, nuclear med)  3rd Floor: - Vacant  4th Floor: - TCTS (cardiothoracic surgery) - AFib Clinic - Structural Heart Clinic - Vascular Surgery  - Vascular Ultrasound  5th Floor: - HeartCare Cardiology (general and EP) - Clinical Pharmacy for coumadin, hypertension, lipid, weight-loss medications, and med management appointments    Valet parking services will be available as well.

## 2024-01-22 NOTE — Progress Notes (Signed)
 Cardiology Office Note:    Date:  01/22/2024  ID:  [name], DOB 10-Jan-1941, MRN [id number] PCP: Orlena Bitters, MD   [facility] Providers Cardiologist:  Alexandria Angel, MD       Patient Profile:      Chief Complaint: 1 month follow-up HFrEF and hypertension History of Present Illness:  [name] is a 83 y.o. male with visit-pertinent history of coronary artery disease, abdominal aortic aneurysm, COPD, hypertension, hyperlipidemia, history of laryngeal cancer s/p radiation therapy   In 2008 patient had PCI of OM2/OM 3. Patient had PCI of his LAD in December 2014 and had a 60% residual mid RCA lesion. Echocardiogram in July 2017 showed normal EF, mild diastolic dysfunction, mild aortic and mitral regurgitation, mildly dilated aortic root measuring at 4.2 cm, moderate LAE. Myoview  in November 2019 showed EF 54%, no ischemia. CTA obtained in November 2019 showed 4.5 cm thoracic aortic aneurysm, abdominal aorta measuring 2.5 cm. CTA in December 2020 showed dilation of the aortic root at 4.4 cm. Myoview  obtained in 05/2020 was normal with no ischemia or infarction, EF 54%. In March 2023 patient was seen in office with lightheadedness and dependent bilateral lower extremity edema. 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 In September 2023 patient became weak and lost his appetite following COVID infection. He was hospitalized due to fall found to have elevated troponin in the 60s however serial troponin was flat. Echo showed EF 45-50% with global hypokinesis. Patient denied any chest pain, he was not placed on beta-blocker due to borderline bradycardia. Repeat limited echo was obtained which showed EF 45 to 50%, RV systolic function was not well-visualized, borderline dilated ascending aorta measuring 37 mm. No ischemic evaluation was pursued given  lack of chest pain    He was seen in clinic on 02/11/2023 by Dr. Audery Blazing.  Patient was without any cardiovascular concerns or complaints at that time.  Echocardiogram on 10/15/2023 indicated LVEF of 35-40%, global hypokinesis, mild concentric LVH, grade 1 diastolic dysfunction, RV systolic function normal, RV size mildly enlarged, mild to moderate aortic valve regurgitation, no aortic valve stenosis, moderate dilation of aortic root measuring 44 mm.  GDMT was initiated, his amlodipine  and losartan  were discontinued he was started on Entresto  24-26 and metoprolol  25 mg.   Seen in clinic on 12/13/2023.  Patient noted to have some slight dizziness.  He was hypotensive in office with a blood pressure 96/50.  His Lasix  was decreased to 20 mg daily and metoprolol  was decreased to 12.5 mg daily.  Ischemic evaluation was considered however deferred by the patient.  He was to follow-up in 1 week.  Last seen in office on 12/20/2023 for follow-up hypertension lightheadedness after medication change.  He denied further episodes of lightheadedness since adjustment.  He was overall asymptomatic besides some generalized fatigue.  His average home BP over the past week was 90s over 40s.  His blood pressure in clinic was 90/40 and repeat 96/50.  Given his age and risk of falls given hypotensive blood pressures, Entresto  was discontinued and losartan  25 mg was started.  Patient was given the option for further ischemic evaluation however given he was overall asymptomatic and walking around 3 miles a day without exertional symptoms, patient deferred further ischemic testing.  He was to return in 1 month.   Discussed the use of AI  scribe software for clinical note transcription with the patient, who gave verbal consent to proceed.  History of Present Illness The patient, presents with his daughter for 1 month follow-up for hypertension and HFrEF.  Today however, he presents with increased fatigue and weakness which he was not  experiencing on his prior visits f/u visits.  The patient reports that he used to be able to walk a good distance without feeling too tired, but recently, he has been feeling more tired than usual. This fatigue has been affecting his daily life. The patient also reports unintentional weight loss. The patient denies any chest pain, dyspnea, syncope, PND, orthopnea, lightheadedness, or dizziness. The patient's home blood pressure readings average 100s over 50s.    Review of systems:  Please see the history of present illness. All other systems are reviewed and otherwise negative.     Home Medications:    Current Meds  Medication Sig   aspirin  EC 81 MG tablet Take 1 tablet (81 mg total) by mouth daily.   cromolyn  (NASALCROM ) 5.2 MG/ACT nasal spray Place 1 spray into both nostrils 3 (three) times daily.   docusate sodium  (COLACE) 100 MG capsule Take 100 mg by mouth daily.   etodolac  (LODINE ) 400 MG tablet Take 400 mg by mouth 2 (two) times daily.   furosemide  (LASIX ) 20 MG tablet Take 1 tablet (20 mg total) by mouth daily.   hydrocortisone 2.5 % cream Apply topically 2 (two) times daily as needed.   LORazepam  (ATIVAN ) 1 MG tablet Take 1 mg by mouth See admin instructions. Take 1/2 to 1 tablet by mouth every evening   losartan  (COZAAR ) 25 MG tablet Take 1 tablet (25 mg total) by mouth daily.   metoprolol  succinate (TOPROL  XL) 25 MG 24 hr tablet Take 0.5 tablets (12.5 mg total) by mouth daily.   mirabegron  ER (MYRBETRIQ ) 25 MG TB24 tablet Take 1 tablet (25 mg total) by mouth daily.   montelukast  (SINGULAIR ) 10 MG tablet Take 10 mg by mouth at bedtime.   nitroGLYCERIN  (NITROSTAT ) 0.4 MG SL tablet Place 1 tablet (0.4 mg total) under the tongue every 5 (five) minutes as needed for chest pain.   nystatin  (MYCOSTATIN /NYSTOP ) powder Apply 1 Application topically 2 (two) times daily.   pantoprazole  (PROTONIX ) 40 MG tablet Take 40 mg by mouth every other day.   polyvinyl alcohol  (LIQUIFILM TEARS) 1.4 %  ophthalmic solution Place 1 drop into both eyes as needed for dry eyes.    Potassium Chloride  ER 20 MEQ TBCR Take 20 mEq by mouth 2 (two) times daily.   rosuvastatin  (CRESTOR ) 20 MG tablet Take 1 tablet (20 mg total) by mouth daily.   tamsulosin  (FLOMAX ) 0.4 MG CAPS capsule Take 0.4 mg by mouth daily.    Studies Reviewed:       Echocardiogram 10/15/2023 1. Left ventricular ejection fraction, by estimation, is 35 to 40%. The  left ventricle has moderately decreased function. The left ventricle  demonstrates global hypokinesis. There is mild concentric left ventricular  hypertrophy. Left ventricular  diastolic parameters are consistent with Grade I diastolic dysfunction  (impaired relaxation).   2. Right ventricular systolic function is normal. The right ventricular  size is mildly enlarged. There is normal pulmonary artery systolic  pressure. The estimated right ventricular systolic pressure is 25.8 mmHg.   3. The mitral valve is normal in structure. Mild mitral valve  regurgitation. No evidence of mitral stenosis.   4. The aortic valve is tricuspid. There is mild calcification of the  aortic valve. Aortic valve regurgitation is mild to moderate. No aortic  stenosis is present.   5. Aortic dilatation noted. There is moderate dilatation of the aortic  root, measuring 44 mm.   6. The inferior vena cava is normal in size with greater than 50%  respiratory variability, suggesting right atrial pressure of 3 mmHg.    Lexiscan  stress test 05/02/2020 There was no ST segment deviation noted during stress. Nuclear stress EF: 54%. No wall motion abnormalities The study is normal. This is a low risk study. No ischemia or infarct identified.  Risk Assessment/Calculations:             Physical Exam:   VS:  BP (!) 102/52 (BP Location: Left Arm, Patient Position: Sitting, Cuff Size: Normal)   Pulse 67   Ht 5\' 6"  (1.676 m)   Wt 192 lb 9.6 oz (87.4 kg)   SpO2 97%   BMI 31.09 kg/m    Wt  Readings from Last 3 Encounters:  01/22/24 192 lb 9.6 oz (87.4 kg)  12/20/23 198 lb (89.8 kg)  12/13/23 196 lb (88.9 kg)    GEN: Well nourished, well developed in no acute distress NECK: No JVD; No carotid bruits CARDIAC: RRR, no murmurs, rubs, gallops RESPIRATORY:  Clear to auscultation without rales, wheezing or rhonchi  ABDOMEN: Soft, non-tender, non-distended EXTREMITIES:  No edema; No acute deformity     Assessment and Plan:  HFrEF / Cardiomyopathy Echocardiogram on 10/15/2023 indicated LVEF of 35 - 40%, global hypokinesis, mild concentric LVH, grade 1 diastolic dysfunction (previously 45-50% in 2023) - Today he is euvolemic and well compensated on exam.  NYHA class II - GDMT optimization has been limited in the past due to hypotension (Lasix  decreased to 20 mg, metoprolol  to 12.5 mg, and Entresto  discontinued)  - Will order cardiac PET stress today for further ischemic evaluation for reduced LVEF and new onset fatigue on exertion as noted in HPI - Continue Lasix  20 mg daily, losartan  25 mg daily, metoprolol  XL 12.5 mg daily - BMET and CBC today   Aortic insufficiency Echocardiogram 10/2023 showed mild to moderate aortic valve regurgitation, no aortic valve stenosis was present - Overall asymptomatic, can repeat echocardiogram in 1 year or when clinically indicated   Coronary artery disease H/o PCI of his LAD in December 2014, residual 60% mid RCA.  Nuclear study in August 2021 showed EF 54% and no ischemia or infarction.   - Continues to deny any chest pains, dyspnea, syncope.  He does note new onset fatigue on exertion that seems to be worsening over the past month as noted in HPI - Given recent reduction in LVEF and worsening fatigue will order cardiac PET stress for further ischemic evaluation - Continue aspirin  81 mg daily, metoprolol  XL 12.5 mg daily, rosuvastatin  20 mg daily   History of thoracic aortic aneurysm CTA in 08/2018 showed 4.5 cm thoracic AAA.  CTA December 2020  showed dilation of the aortic root at 4.4 cm.  Echo in January 2025 indicated aortic root dilation at 44 mm  - CT angio chest aorta has been ordered   Hypertension ----> Hypotension Blood pressure today is 102/52 Home blood pressure average 100s/50s Began to have issues with hypotension after GDMT was initiated  - Overall he is asymptomatic without lightheadedness, dizziness, syncope, presyncope, falls.  However he is noted to have fatigue.  If cardiac PET stress is unremarkable consider further down titrating GDMT as low normal blood pressure could be a cause of his fatigue -  Continue losartan  25 mg daily, metoprolol  XL 12.5 mg daily   Hyperlipidemia Last LDL 116 on 01/2023 LDL not currently at goal less than 70 Has been managed by PCP - He will need repeat fasting lipid panel with follow-up visit - Continue rosuvastatin  20 mg daily    Informed Consent   Shared Decision Making/Informed Consent The risks [chest pain, shortness of breath, cardiac arrhythmias, dizziness, blood pressure fluctuations, myocardial infarction, stroke/transient ischemic attack, nausea, vomiting, allergic reaction, radiation exposure, metallic taste sensation and life-threatening complications (estimated to be 1 in 10,000)], benefits (risk stratification, diagnosing coronary artery disease, treatment guidance) and alternatives of a cardiac PET stress test were discussed in detail with Mr. Yoho and he agrees to proceed.     Dispo:  Return in about 8 weeks (around 03/18/2024).  Signed, Ava Boatman, NP

## 2024-01-23 LAB — BASIC METABOLIC PANEL WITH GFR
BUN/Creatinine Ratio: 19 (ref 10–24)
BUN: 23 mg/dL (ref 8–27)
CO2: 27 mmol/L (ref 20–29)
Calcium: 9.5 mg/dL (ref 8.6–10.2)
Chloride: 101 mmol/L (ref 96–106)
Creatinine, Ser: 1.19 mg/dL (ref 0.76–1.27)
Glucose: 98 mg/dL (ref 70–99)
Potassium: 4.5 mmol/L (ref 3.5–5.2)
Sodium: 142 mmol/L (ref 134–144)
eGFR: 60 mL/min/{1.73_m2} (ref >59)

## 2024-01-23 LAB — CBC
Hematocrit: 41.4 % (ref 37.5–51.0)
Hemoglobin: 14 g/dL (ref 13.0–17.7)
MCH: 30.2 pg (ref 26.6–33.0)
MCHC: 33.8 g/dL (ref 31.5–35.7)
MCV: 89 fL (ref 79–97)
Platelets: 330 10*3/uL (ref 150–450)
RBC: 4.63 x10E6/uL (ref 4.14–5.80)
RDW: 13.3 % (ref 11.6–15.4)
WBC: 5.7 10*3/uL (ref 3.4–10.8)

## 2024-02-11 DIAGNOSIS — D692 Other nonthrombocytopenic purpura: Secondary | ICD-10-CM | POA: Diagnosis not present

## 2024-02-11 DIAGNOSIS — Z299 Encounter for prophylactic measures, unspecified: Secondary | ICD-10-CM | POA: Diagnosis not present

## 2024-02-11 DIAGNOSIS — M549 Dorsalgia, unspecified: Secondary | ICD-10-CM | POA: Diagnosis not present

## 2024-02-11 DIAGNOSIS — I10 Essential (primary) hypertension: Secondary | ICD-10-CM | POA: Diagnosis not present

## 2024-02-11 DIAGNOSIS — I5032 Chronic diastolic (congestive) heart failure: Secondary | ICD-10-CM | POA: Diagnosis not present

## 2024-02-11 DIAGNOSIS — K5900 Constipation, unspecified: Secondary | ICD-10-CM | POA: Diagnosis not present

## 2024-03-11 DIAGNOSIS — J449 Chronic obstructive pulmonary disease, unspecified: Secondary | ICD-10-CM | POA: Diagnosis not present

## 2024-03-11 DIAGNOSIS — D509 Iron deficiency anemia, unspecified: Secondary | ICD-10-CM | POA: Diagnosis not present

## 2024-03-11 DIAGNOSIS — I10 Essential (primary) hypertension: Secondary | ICD-10-CM | POA: Diagnosis not present

## 2024-03-11 DIAGNOSIS — Z0000 Encounter for general adult medical examination without abnormal findings: Secondary | ICD-10-CM | POA: Diagnosis not present

## 2024-03-11 DIAGNOSIS — E559 Vitamin D deficiency, unspecified: Secondary | ICD-10-CM | POA: Diagnosis not present

## 2024-03-11 DIAGNOSIS — Z299 Encounter for prophylactic measures, unspecified: Secondary | ICD-10-CM | POA: Diagnosis not present

## 2024-03-11 DIAGNOSIS — K5900 Constipation, unspecified: Secondary | ICD-10-CM | POA: Diagnosis not present

## 2024-04-27 ENCOUNTER — Telehealth (HOSPITAL_COMMUNITY): Payer: Self-pay | Admitting: *Deleted

## 2024-04-27 NOTE — Telephone Encounter (Signed)
 Instructions given to patient's daughter for upcoming stress test.  [name]

## 2024-04-29 ENCOUNTER — Encounter (HOSPITAL_COMMUNITY): Admission: RE | Admit: 2024-04-29 | Source: Ambulatory Visit

## 2024-05-04 ENCOUNTER — Other Ambulatory Visit: Payer: Self-pay | Admitting: Emergency Medicine

## 2024-05-04 DIAGNOSIS — I10 Essential (primary) hypertension: Secondary | ICD-10-CM

## 2024-05-04 DIAGNOSIS — I2510 Atherosclerotic heart disease of native coronary artery without angina pectoris: Secondary | ICD-10-CM

## 2024-05-04 DIAGNOSIS — I5020 Unspecified systolic (congestive) heart failure: Secondary | ICD-10-CM

## 2024-05-06 ENCOUNTER — Ambulatory Visit (HOSPITAL_COMMUNITY)
Admission: RE | Admit: 2024-05-06 | Discharge: 2024-05-06 | Disposition: A | Discharge status: Home / Self Care | Source: Ambulatory Visit | Attending: Cardiovascular Disease | Admitting: Cardiovascular Disease

## 2024-05-06 DIAGNOSIS — I2510 Atherosclerotic heart disease of native coronary artery without angina pectoris: Secondary | ICD-10-CM | POA: Insufficient documentation

## 2024-05-06 DIAGNOSIS — I5020 Unspecified systolic (congestive) heart failure: Secondary | ICD-10-CM | POA: Insufficient documentation

## 2024-05-06 DIAGNOSIS — I10 Essential (primary) hypertension: Secondary | ICD-10-CM | POA: Diagnosis not present

## 2024-05-06 LAB — MYOCARDIAL PERFUSION IMAGING
LV dias vol: 177 mL (ref 62–150)
LV sys vol: 92 mL (ref 4.2–5.8)
Nuc Stress EF: 48 %
Peak HR: 93 {beats}/min
Rest HR: 58 {beats}/min
Rest Nuclear Isotope Dose: 11 mCi
SDS: 2
SRS: 9
SSS: 9
ST Depression (mm): 0 mm
Stress Nuclear Isotope Dose: 31.2 mCi
TID: 0.96

## 2024-05-06 MED ORDER — REGADENOSON 0.4 MG/5ML IV SOLN
0.4000 mg | Freq: Once | INTRAVENOUS | Status: AC
  Administered 2024-05-06: 0.4 mg via INTRAVENOUS

## 2024-05-06 MED ORDER — REGADENOSON 0.4 MG/5ML IV SOLN
INTRAVENOUS | Status: AC
  Filled 2024-05-06: qty 5

## 2024-05-06 MED ORDER — TECHNETIUM TC 99M TETROFOSMIN IV KIT
31.2000 | PACK | Freq: Once | INTRAVENOUS | Status: AC | PRN
  Administered 2024-05-06: 31.2 via INTRAVENOUS

## 2024-05-06 MED ORDER — TECHNETIUM TC 99M TETROFOSMIN IV KIT
11.0000 | PACK | Freq: Once | INTRAVENOUS | Status: AC | PRN
  Administered 2024-05-06: 11 via INTRAVENOUS

## 2024-05-11 ENCOUNTER — Ambulatory Visit: Payer: Self-pay | Admitting: Cardiology

## 2024-05-20 ENCOUNTER — Encounter (HOSPITAL_COMMUNITY)

## 2024-05-27 ENCOUNTER — Ambulatory Visit: Attending: Emergency Medicine | Admitting: Emergency Medicine

## 2024-05-27 ENCOUNTER — Encounter: Payer: Self-pay | Admitting: Emergency Medicine

## 2024-05-27 VITALS — BP 120/62 | HR 62 | Ht 66.0 in | Wt 195.0 lb

## 2024-05-27 DIAGNOSIS — I2510 Atherosclerotic heart disease of native coronary artery without angina pectoris: Secondary | ICD-10-CM

## 2024-05-27 DIAGNOSIS — I5020 Unspecified systolic (congestive) heart failure: Secondary | ICD-10-CM

## 2024-05-27 DIAGNOSIS — I10 Essential (primary) hypertension: Secondary | ICD-10-CM | POA: Diagnosis not present

## 2024-05-27 DIAGNOSIS — I712 Thoracic aortic aneurysm, without rupture, unspecified: Secondary | ICD-10-CM | POA: Diagnosis not present

## 2024-05-27 DIAGNOSIS — I351 Nonrheumatic aortic (valve) insufficiency: Secondary | ICD-10-CM | POA: Diagnosis not present

## 2024-05-27 DIAGNOSIS — E785 Hyperlipidemia, unspecified: Secondary | ICD-10-CM

## 2024-05-27 NOTE — Patient Instructions (Addendum)
 Medication Instructions:  NO CHANGES  Lab Work: NONE   Testing/Procedures: Your physician has requested that you have an echocardiogram. Echocardiography is a painless test that uses sound waves to create images of your heart. It provides your doctor with information about the size and shape of your heart and how well your heart's chambers and valves are working. This procedure takes approximately one hour. There are no restrictions for this procedure. Please do NOT wear cologne, perfume, aftershave, or lotions (deodorant is allowed). Please arrive 15 minutes prior to your appointment time.  Please note: We ask at that you not bring children with you during ultrasound (echo/ vascular) testing. Due to room size and safety concerns, children are not allowed in the ultrasound rooms during exams. Our front office staff cannot provide observation of children in our lobby area while testing is being conducted. An adult accompanying a patient to their appointment will only be allowed in the ultrasound room at the discretion of the ultrasound technician under special circumstances. We apologize for any inconvenience.   Follow-Up: At [facility], you and your health needs are our priority.  As part of our continuing mission to provide you with exceptional heart care, our providers are all part of one team.  This team includes your primary Cardiologist (physician) and Advanced Practice Providers or APPs (Physician Assistants and Nurse Practitioners) who all work together to provide you with the care you need, when you need it.  Your next appointment:   6 month(s)  Provider:   Redell Shallow, MD    We recommend signing up for the patient portal called MyChart.  Sign up information is provided on this After Visit Summary.  MyChart is used to connect with patients for Virtual Visits (Telemedicine).  Patients are able to view lab/test results, encounter notes, upcoming appointments, etc.   Non-urgent messages can be sent to your provider as well.   To learn more about what you can do with MyChart, go to [email or web address].

## 2024-05-27 NOTE — Progress Notes (Addendum)
 Cardiology Office Note:    Date:  05/27/2024  ID:  [name], DOB 1940/12/10, MRN [id number] PCP: [name], MD  [facility] Providers Cardiologist:  Redell Shallow, MD Cardiology APP:  Rana Lum CROME, NP       Patient Profile:       Chief Complaint: 39-month follow-up History of Present Illness:  [name] is a 83 y.o. male with visit-pertinent history of  coronary artery disease, abdominal aortic aneurysm, COPD, hypertension, hyperlipidemia, history of laryngeal cancer s/p radiation therapy   In 2008 patient had PCI of OM2/OM 3. Patient had PCI of his LAD in December 2014 and had a 60% residual mid RCA lesion. Echocardiogram in July 2017 showed normal EF, mild diastolic dysfunction, mild aortic and mitral regurgitation, mildly dilated aortic root measuring at 4.2 cm, moderate LAE. Myoview  in November 2019 showed EF 54%, no ischemia. CTA obtained in November 2019 showed 4.5 cm thoracic aortic aneurysm, abdominal aorta measuring 2.5 cm. CTA in December 2020 showed dilation of the aortic root at 4.4 cm. Myoview  obtained in 05/2020 was normal with no ischemia or infarction, EF 54%. In March 2023 patient was seen in office with lightheadedness and dependent bilateral lower extremity edema. 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 In September 2023 patient became weak and lost his appetite following COVID infection. He was hospitalized due to fall found to have elevated troponin in the 60s however serial troponin was flat. Echo showed EF 45-50% with global hypokinesis. Patient denied any chest pain, he was not placed on beta-blocker due to borderline bradycardia. Repeat limited echo was obtained which showed EF 45 to 50%, RV systolic function was not well-visualized, borderline dilated ascending aorta measuring 37 mm. No ischemic  evaluation was pursued given lack of chest pain    He was seen in clinic on 02/11/2023 by Dr. Shallow.  Patient was without any cardiovascular concerns or complaints at that time.  Echocardiogram on 10/15/2023 indicated LVEF of 35-40%, global hypokinesis, mild concentric LVH, grade 1 diastolic dysfunction, RV systolic function normal, RV size mildly enlarged, mild to moderate aortic valve regurgitation, no aortic valve stenosis, moderate dilation of aortic root measuring 44 mm.  GDMT was initiated, his amlodipine  and losartan  were discontinued he was started on Entresto  24-26 and metoprolol  25 mg.   Seen in clinic on 12/13/2023.  Patient noted to have some slight dizziness.  He was hypotensive in office with a blood pressure 96/50.  His Lasix  was decreased to 20 mg daily and metoprolol  was decreased to 12.5 mg daily.  Ischemic evaluation was considered however deferred by the patient.  He was to follow-up in 1 week.   Last seen in office on 12/20/2023 for follow-up hypertension and lightheadedness after medication change.  He denied further episodes of lightheadedness since adjustment.  He was overall asymptomatic besides some generalized fatigue.  His average home BP over the past week was 90s over 40s.  His blood pressure in clinic was 90/40 and repeat 96/50.  Given his age and risk of falls given hypotensive blood pressures, Entresto  was discontinued and losartan  25 mg was started.  Patient was given the option for further ischemic evaluation however given he was overall asymptomatic and walking around 3 miles a day without exertional symptoms, patient deferred further ischemic testing.  He was to return in 1 month.  He was last seen in office on 01/22/2024.  He presented with increased fatigue and weakness which she had not been experiencing on prior visits.  His home blood pressure readings average 100s over 50s which has been improved.  He underwent cardiac PET/CT showing small, mild, fixed defect in the  apex consistent with LAD infarction as well as small, mild, fixed defect in the basal to mid inferolateral wall consistent with LCx infarction.  There was no ischemia.  The left ventricular ejection fraction was mildly decreased at 45-54%.   Discussed the use of AI scribe software for clinical note transcription with the patient, who gave verbal consent to proceed.  History of Present Illness [name] is an 83 year old male with coronary artery disease and hypertension who presents for follow-up after a stress test. He is accompanied by his sister-in-law.  He feels much improved this visit compared to last office visit, with blood pressure returning to normal levels and much improved energy, back to his previous activity level.  He is able to walk a mile a day without exertional symptoms.  He experienced some mild dyspnea on exertion after he overexerts himself however no dyspnea during his mile walks.  He is without any orthopnea, PND, leg swelling, syncope, presyncope, lightheadedness, dizziness.   Review of systems:  Please see the history of present illness. All other systems are reviewed and otherwise negative.      Studies Reviewed:        Lexiscan  Myoview  05/06/2024   Small, mild, fixed defect in the apex consistent with LAD infarction. Small, mild, fixed defect in the basal to mid inferolateral wall consistent with likely LCX infarction. No ischemia. LVEF  mildly reudced with chamber dilation. Intermediate risk study.   LV perfusion is abnormal. There is no evidence of ischemia. Defect 1: There is a small defect with mild reduction in uptake present in the apex location(s) that is fixed. There is abnormal wall motion in the defect area. Consistent with infarction. Defect 2: There is a small defect with mild reduction in uptake present in the mid to basal inferolateral location(s) that is fixed. There is abnormal wall motion in the defect area. Consistent with infarction.   Left  ventricular function is abnormal. Nuclear stress EF: 48%. The left ventricular ejection fraction is mildly decreased (45-54%). End diastolic cavity size is moderately enlarged.   Coronary calcium  assessment not performed due to prior revascularization.   Findings are consistent with infarction. The study is intermediate risk.  Echocardiogram 10/15/2023 1. Left ventricular ejection fraction, by estimation, is 35 to 40%. The  left ventricle has moderately decreased function. The left ventricle  demonstrates global hypokinesis. There is mild concentric left ventricular  hypertrophy. Left ventricular  diastolic parameters are consistent with Grade I diastolic dysfunction  (impaired relaxation).   2. Right ventricular systolic function is normal. The right ventricular  size is mildly enlarged. There is normal pulmonary artery systolic  pressure. The estimated right ventricular systolic pressure is 25.8 mmHg.   3. The mitral valve is normal in structure. Mild mitral valve  regurgitation. No evidence of mitral stenosis.   4. The aortic valve is tricuspid. There is mild calcification of the  aortic valve. Aortic valve regurgitation is mild to moderate. No aortic  stenosis is present.   5. Aortic dilatation noted. There is moderate dilatation of the aortic  root, measuring 44 mm.   6. The inferior vena cava is normal in size with greater than 50%  respiratory variability, suggesting right atrial pressure of 3 mmHg.    Lexiscan  stress test 05/02/2020 There was no ST segment deviation noted during stress. Nuclear stress EF: 54%. No wall motion abnormalities The study is normal. This is a low risk study. No ischemia or infarct identified.   Risk Assessment/Calculations:              Physical Exam:   VS:  BP 120/62   Pulse 62   Ht 5' 6 (1.676 m)   Wt 195 lb (88.5 kg)   SpO2 99%   BMI 31.47 kg/m    Wt Readings from Last 3 Encounters:  05/27/24 195 lb (88.5 kg)  05/06/24 192 lb (87.1 kg)   01/22/24 192 lb 9.6 oz (87.4 kg)    GEN: Well nourished, well developed in no acute distress NECK: No JVD; No carotid bruits CARDIAC: RRR, no murmurs, rubs, gallops RESPIRATORY:  Clear to auscultation without rales, wheezing or rhonchi  ABDOMEN: Soft, non-tender, non-distended EXTREMITIES:  No edema; No acute deformity      Assessment and Plan:  HFrEF / Cardiomyopathy Echocardiogram on 10/15/2023 indicated LVEF of 35 - 40%, global hypokinesis, mild concentric LVH, grade 1 diastolic dysfunction (previously 45-50% in 2023) Lexiscan  Myoview  05/2024 consistent with LAD and likely LCx infarction with no ischemia with EF 45-54% - Today he is euvolemic and well compensated on exam.  NYHA class II - Able to walk a mile daily without limitation.  No orthopnea, PND, dyspnea - GDMT optimization has been limited in the past due to hypotension (Lasix  decreased to 20 mg, metoprolol  to 12.5 mg, and Entresto  discontinued)  - Will continue current medical therapy - Repeat echocardiogram x 6 months to reassess EF - Continue Lasix  20 mg daily, losartan  25 mg daily, metoprolol  XL 12.5 mg daily   Aortic insufficiency Echocardiogram 10/2023 showed mild to moderate aortic valve regurgitation, no aortic valve stenosis was present - Repeat echocardiogram x 6 months for routine monitoring   Coronary artery disease H/o PCI of his LAD in December 2014, residual 60% mid RCA.   Lexiscan  Myoview  05/2024 consistent with prior LAD and likely LCx infarction with no ischemia - No chest pains or dyspnea.  Exertional fatigue has improved.  Able to walk a mile daily without exertional symptoms. - Given recent stress test without ischemia we will continue current medical therapy - Continue aspirin  81 mg daily, metoprolol  XL 12.5 mg daily - He tells me today he stopped [facility] several months ago [facility] given intolerance with myalgias.  Reports [facility] with [facility] and prefers not to start further statin  therapy today.  Not interested in injectable medications   History of thoracic aortic aneurysm CTA in 08/2018 showed 4.5 cm thoracic AAA.  CTA December 2020 showed dilation of the aortic root at 4.4 cm.  Echo in January 2025 indicated aortic root dilation at 44 mm  - CT angio chest aorta has been ordered and pending   Hypertension Blood pressure today is well-controlled at 120/62 Began to have issues with hypotension after GDMT was initiated  - Overall he is asymptomatic without lightheadedness, dizziness, syncope, presyncope, falls.  - Continue losartan  25 mg daily, metoprolol  XL 12.5 mg daily   Hyperlipidemia, LDL goal <70 Last LDL 116 on 01/2023 LDL not currently at goal Has been managed by [facility] - He reports to me today that he has been off of statin therapy now for several months given statin intolerance with myalgias.  Reports he has  tried several statins with similar symptoms.  Today he is not interested in beginning further cholesterol-lowering therapy nor interested in injectable medication.  Reports had as been getting his cholesterol managed at [facility] - Consider referral to Pharm.D. lipid clinic follow-up visit       Dispo:  Return in about 6 months (around 11/27/2024).  Signed, Lum LITTIE Louis, NP

## 2024-09-02 ENCOUNTER — Encounter: Payer: Self-pay | Admitting: Emergency Medicine

## 2024-09-02 ENCOUNTER — Telehealth: Payer: Self-pay | Admitting: Cardiology

## 2024-09-02 NOTE — Telephone Encounter (Signed)
 Error

## 2024-09-02 NOTE — Telephone Encounter (Signed)
 S/w Debra(EC) and she reports that the [facility] said not to take Losartan  and Metoprolol  for 2-3 days until BP gets back to normal. She just wanted to check with Dr Pietro to be sure this was ok  BP- 92/43 hr 75. He has been dizzy and has lost his balance several times/did not fall, but has some scrapes.  She reports that he has been drinking a lot, trying to up his fluids. Also he ate a pack of crackers before dinner. Informed her that crackers or chips are high in sodium and can help increase bp (instructed not to eat too much, just a serving. Can give him another serving now). Instructed her to hold the meds in the morning as instructed by the [facility] provider and I will send this information to Dr Pietro for further recommendations. She verbalized understanding.

## 2024-09-02 NOTE — Telephone Encounter (Signed)
 Pt c/o BP issue: STAT if pt c/o blurred vision, one-sided weakness or slurred speech.  STAT if BP is GREATER than 180/120 TODAY.  STAT if BP is LESS than 90/60 and SYMPTOMATIC TODAY  1. What is your BP concern? Pt daughter called in sitting pt went to the [facility] today to be seen for dizzness. His bp was low while he was there. She states the doctor told her not to give him any of his meds for 2-3 days until BP goes back up. They were advised to call Dr. Vertie office for advise on what to do. She states she was also advised that she can take him to the ED for IV fluids or just give him lots of fluids at home.   2. Have you taken any BP medication today? Yes   3. What are your last 5 BP readings?  76/42 - at the doctor's office  She is not sure of his BP right now as they are still on the way home   4. Are you having any other symptoms (ex. Dizziness, headache, blurred vision, passed out)?   Dizziness earlier this morning, symptoms have gone away since seeing the doctor

## 2024-09-03 NOTE — Progress Notes (Signed)
 Cardiology Office Note    Date:  09/04/2024  ID:  [name], [name] [birth year], MRN [id number] PCP:  Zachary Leta NOVAK, MD  Cardiologist:  Zachary Shallow, MD  Electrophysiologist:  None   Chief Complaint: Follow-up for hypotension  History of Present Illness: .    [name] is a 83 y.o. male with visit-pertinent history of CAD, AAA, COPD, hypertension, hyperlipidemia and history of laryngeal cancer s/p radiation therapy.   In 2008 patient had PCI of OM2/OM 3.  Patient had PCI of his LAD in December 2014 and had a 60% residual mid RCA lesion.  Echocardiogram in July 2017 showed normal EF, mild diastolic dysfunction, mild aortic and mitral regurgitation, mildly dilated aortic root measuring at 4.2 cm, moderate LAE.  Myoview  in November 2019 showed EF 54%, no ischemia.  CTA obtained in November 2019 showed 4.5 cm thoracic aortic aneurysm, abdominal aorta measuring 2.5 cm.  CTA in December 2020 showed dilation of the aortic root at 4.4 cm.  Myoview  obtained in 05/2020 was normal with no ischemia or infarction, EF 54%.  In March 2023 patient was seen in office with lightheadedness and dependent bilateral lower extremity edema.  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  In September 2023 patient became weak and lost his appetite following COVID infection.  He was hospitalized due to fall found to have elevated troponin in the 60s however serial troponin was flat.  Echo showed EF 4550% with global hypokinesis.  Patient denied any chest pain, he was not placed on beta-blocker due to borderline bradycardia.  Repeat limited echo was obtained which showed EF 45 to 50%, RV systolic function was not well-visualized, borderline dilated ascending aorta measuring 37 mm.  No ischemic evaluation was pursued given lack of chest pain.   Patient was seen in clinic on  02/11/2023 by Dr. Shallow.  Patient had remained stable from a cardiac standpoint.  Echocardiogram on 10/15/2023 indicated LVEF of 35 to 40%, global hypokinesis, mild concentric LVH, grade 1 diastolic dysfunction, RV systolic function was normal, RV size was mildly enlarged.  There was mild to moderate aortic valve regurgitation, no aortic valve stenosis was present.  There was moderate dilation of the aortic root measuring 44 mm.  At office visit on 12/20/2023 for follow-up, he denied any lightheadedness, he was overall asymptomatic aside from generalized fatigue.  However his blood pressure had been consistently in the 90s systolics, given age and risk of falls given hypotension Entresto  was discontinued and patient was started on losartan  25 mg daily.  When seen in follow-up in 12/2023 patient continued to note fatigue, reported home blood pressures had improved. He underwent cardiac MPI/CT showing small, mild, fixed defect in the apex consistent with LAD infarction as well as small, mild, fixed defect in the basal to mid inferolateral wall consistent with LCx infarction. There was no ischemia. The left ventricular ejection fraction was mildly decreased at 45-54%.   Patient was last seen in clinic on 05/27/2024 by Zachary Louis, NP.  Patient reported feeling much improved compared to his last office visit with blood pressure returning to normal levels and improved energy.  Today he presents regarding hypotension, dizziness and lightheadedness.  He presents today with his daughter-in-law.  Patient reports that a few weeks ago he started experiencing dizziness in the morning upon wakening as well as feeling off  balance when getting up out of bed.  He was seen at the [facility] last week and noted to have low blood pressure with systolics in the 80s/90s.  Patient held his losartan  and metoprolol  for a few days however had increased blood pressure at home with systolics in the 170s.  He restarted metoprolol  and losartan   last night.  He continued on Lasix  20 mg daily.  Patient's daughter-in-law notes that he has not been drinking water regularly, there was concern for dehydration.  He notes some occasional bilateral ankle edema.  He denies any chest pain, increased shortness of breath, orthopnea or PND.  He reports that his weights have been stable. ROS: .   Today he denies chest pain, fatigue, palpitations, melena, hematuria, hemoptysis, diaphoresis, syncope, orthopnea, and PND.  All other systems are reviewed and otherwise negative. Studies Reviewed: SABRA   EKG:  EKG is ordered today, personally reviewed, demonstrating  EKG Interpretation Date/Time:  Friday September 04 2024 13:15:42 EST Ventricular Rate:  69 PR Interval:  210 QRS Duration:  96 QT Interval:  388 QTC Calculation: 415 R Axis:   -32  Text Interpretation: Sinus rhythm with sinus arrhythmia with 1st degree A-V block Left axis deviation Incomplete right bundle branch block T wave abnormality lateral leads Possible Inferior infarct (cited on or before 20-Dec-2023) When compared with ECG of 20-Dec-2023 09:29, No significant change was found Confirmed by Kasandra Fehr [phone]) on 09/04/2024 4:16:16 PM   CV Studies: Cardiac studies reviewed are outlined and summarized above. Otherwise please see EMR for full report. Cardiac Studies & Procedures   ______________________________________________________________________________________________   STRESS TESTS  MYOCARDIAL PERFUSION IMAGING 05/06/2024  Interpretation Summary   Small, mild, fixed defect in the apex consistent with LAD infarction. Small, mild, fixed defect in the basal to mid inferolateral wall consistent with likely LCX infarction. No ischemia. LVEF  mildly reudced with chamber dilation. Intermediate risk study.   LV perfusion is abnormal. There is no evidence of ischemia. Defect 1: There is a small defect with mild reduction in uptake present in the apex location(s) that is fixed. There is  abnormal wall motion in the defect area. Consistent with infarction. Defect 2: There is a small defect with mild reduction in uptake present in the mid to basal inferolateral location(s) that is fixed. There is abnormal wall motion in the defect area. Consistent with infarction.   Left ventricular function is abnormal. Nuclear stress EF: 48%. The left ventricular ejection fraction is mildly decreased (45-54%). End diastolic cavity size is moderately enlarged.   Coronary calcium  assessment not performed due to prior revascularization.   Findings are consistent with infarction. The study is intermediate risk.   ECHOCARDIOGRAM  ECHOCARDIOGRAM COMPLETE 10/15/2023  Narrative ECHOCARDIOGRAM REPORT    Patient Name:   ALEXY HELDT Date of Exam: 10/15/2023 Medical Rec #:  [id number]       Height:       66.0 in Accession #:    [id number]      Weight:       207.2 lb Date of Birth:  26-Oct-1940       BSA:          2.030 m Patient Age:    82 years        BP:           148/56 mmHg Patient Gender: M               HR:           57  bpm. Exam Location:  [address]  Procedure: 2D Echo, Cardiac Doppler and Color Doppler  Indications:    I25.10 CAD  History:        Patient has prior history of Echocardiogram examinations, most recent 07/08/2022. CAD, COPD, AI, Signs/Symptoms:Dilated aortic root; Risk Factors:Hypertension and Dyslipidemia.  Sonographer:    Elsie Bohr RDCS Referring Phys: [id number] BRIAN S CRENSHAW  IMPRESSIONS   1. Left ventricular ejection fraction, by estimation, is 35 to 40%. The left ventricle has moderately decreased function. The left ventricle demonstrates global hypokinesis. There is mild concentric left ventricular hypertrophy. Left ventricular diastolic parameters are consistent with Grade I diastolic dysfunction (impaired relaxation). 2. Right ventricular systolic function is normal. The right ventricular size is mildly enlarged. There is normal pulmonary artery systolic  pressure. The estimated right ventricular systolic pressure is 25.8 mmHg. 3. The mitral valve is normal in structure. Mild mitral valve regurgitation. No evidence of mitral stenosis. 4. The aortic valve is tricuspid. There is mild calcification of the aortic valve. Aortic valve regurgitation is mild to moderate. No aortic stenosis is present. 5. Aortic dilatation noted. There is moderate dilatation of the aortic root, measuring 44 mm. 6. The inferior vena cava is normal in size with greater than 50% respiratory variability, suggesting right atrial pressure of 3 mmHg.  FINDINGS Left Ventricle: Left ventricular ejection fraction, by estimation, is 35 to 40%. The left ventricle has moderately decreased function. The left ventricle demonstrates global hypokinesis. The left ventricular internal cavity size was normal in size. There is mild concentric left ventricular hypertrophy. Left ventricular diastolic parameters are consistent with Grade I diastolic dysfunction (impaired relaxation).  Right Ventricle: The right ventricular size is mildly enlarged. No increase in right ventricular wall thickness. Right ventricular systolic function is normal. There is normal pulmonary artery systolic pressure. The tricuspid regurgitant velocity is 2.39 m/s, and with an assumed right atrial pressure of 3 mmHg, the estimated right ventricular systolic pressure is 25.8 mmHg.  Left Atrium: Left atrial size was normal in size.  Right Atrium: Right atrial size was normal in size.  Pericardium: There is no evidence of pericardial effusion.  Mitral Valve: The mitral valve is normal in structure. Mild mitral valve regurgitation. No evidence of mitral valve stenosis.  Tricuspid Valve: The tricuspid valve is normal in structure. Tricuspid valve regurgitation is trivial.  Aortic Valve: The aortic valve is tricuspid. There is mild calcification of the aortic valve. Aortic valve regurgitation is mild to moderate. Aortic  regurgitation PHT measures 459 msec. No aortic stenosis is present.  Pulmonic Valve: The pulmonic valve was normal in structure. Pulmonic valve regurgitation is trivial.  Aorta: Aortic dilatation noted. There is moderate dilatation of the aortic root, measuring 44 mm.  Venous: The inferior vena cava is normal in size with greater than 50% respiratory variability, suggesting right atrial pressure of 3 mmHg.  IAS/Shunts: No atrial level shunt detected by color flow Doppler.   LEFT VENTRICLE PLAX 2D LVIDd:         5.40 cm   Diastology LVIDs:         3.95 cm   LV e' medial:    4.68 cm/s LV PW:         1.30 cm   LV E/e' medial:  13.4 LV IVS:        1.30 cm   LV e' lateral:   5.66 cm/s LVOT diam:     2.30 cm   LV E/e' lateral: 11.1 LV SV:  89 LV SV Index:   44 LVOT Area:     4.15 cm   RIGHT VENTRICLE             IVC RV S prime:     20.00 cm/s  IVC diam: 0.90 cm TAPSE (M-mode): 2.2 cm RVSP:           25.8 mmHg  LEFT ATRIUM             Index        RIGHT ATRIUM           Index LA diam:        4.40 cm 2.17 cm/m   RA Pressure: 3.00 mmHg LA Vol (A2C):   78.9 ml 38.87 ml/m  RA Area:     17.20 cm LA Vol (A4C):   56.5 ml 27.83 ml/m  RA Volume:   48.10 ml  23.69 ml/m LA Biplane Vol: 66.7 ml 32.86 ml/m AORTIC VALVE LVOT Vmax:   83.00 cm/s LVOT Vmean:  55.800 cm/s LVOT VTI:    0.214 m AI PHT:      459 msec  AORTA Ao Root diam: 4.40 cm Ao Asc diam:  3.70 cm  MITRAL VALVE               TRICUSPID VALVE MV Area (PHT): 2.96 cm    TR Peak grad:   22.8 mmHg MV Decel Time: 256 msec    TR Vmax:        239.00 cm/s MV E velocity: 62.90 cm/s  Estimated RAP:  3.00 mmHg MV A velocity: 93.80 cm/s  RVSP:           25.8 mmHg MV E/A ratio:  0.67 SHUNTS Systemic VTI:  0.21 m Systemic Diam: 2.30 cm  Dalton McleanMD Electronically signed by Ezra Kanner Signature Date/Time: 10/15/2023/3:14:44 PM    Final           ______________________________________________________________________________________________       Current Reported Medications:.    No outpatient medications have been marked as taking for the 09/04/24 encounter (Office Visit) with Stina Gane D, NP.   Physical Exam:    VS:  BP (!) 110/58   Pulse 69   Ht 5' 6 (1.676 m)   Wt 200 lb (90.7 kg)   BMI 32.28 kg/m    Wt Readings from Last 3 Encounters:  09/04/24 200 lb (90.7 kg)  05/27/24 195 lb (88.5 kg)  05/06/24 192 lb (87.1 kg)    GEN: Well nourished, well developed in no acute distress NECK: No JVD; No carotid bruits CARDIAC: RRR, no murmurs, rubs, gallops RESPIRATORY:  Clear to auscultation without rales, wheezing or rhonchi  ABDOMEN: Soft, non-tender, non-distended EXTREMITIES:  No edema; No acute deformity     Asessement and Plan:.    HFrEF/aortic insufficiency/dizziness/hypotension: Echocardiogram on 10/15/2023 indicated LVEF of 35 to 40%, global hypokinesis, mild concentric LVH, grade 1 diastolic dysfunction, RV systolic function was normal, RV size was mildly enlarged. There was mild to moderate aortic valve regurgitation, no aortic valve stenosis was present. Lexiscan  Myoview  05/2024 consistent with LAD and likely LCx infarction with no ischemia with EF 45-54%. Today he denies shortness of breath, orthopnea or PND.  He notes some intermittent bilateral ankle edema.  He reports increased dizziness and lightheadedness in recent weeks, recently seen the [facility] with hypotension.  He held his losartan  and metoprolol  for a few days however had significant increases in blood pressure with systolic BP in the 170s.  Given hypotension we will  have him split his metoprolol  and losartan , he will take losartan  in the morning and metoprolol  at night.  Will also decrease his Lasix  to 20 mg every other day.  Discussed with patient and daughter-in-law that if he has weight gain of 2 to 3 pounds overnight or 5 pounds in a week he may resume Lasix   20 mg daily.  He appears euvolemic and well compensated today.  Encouraged patient to make slow position changes, elevate his legs and wear compression stockings. GDMT has been limited in the past due to hypotension. Repeat echo scheduled for 11/04/24. Check CBC and BMET today.   CAD: History of PCI of his LAD in December 2014, residual 60% mid RCA. Nuclear study in August 2021 showed EF 54% and no ischemia or infarction. Lexiscan  Myoview  05/2024 consistent with LAD and likely LCx infarction with no ischemia with EF 45-54%. Stable with no anginal symptoms. No indication for ischemic evaluation.  Heart healthy diet and regular cardiovascular exercise encouraged.  Reviewed ED precautions.  History of thoracic aortic aneurysm: CTA in 08/2018 showed 4.5 cm T AAA. CTA December 2020 showed dilation of the aortic root at 4.4 cm. Echo in January 2025 indicated aortic root dilation at 44 mm.  CT angio chest/aorta pending.    Disposition: F/u with Dr. Pietro in 11/2024 following echocardiogram   Signed, Maison Agrusa D Junior Huezo, NP

## 2024-09-03 NOTE — Telephone Encounter (Signed)
Spoke with pt daughter, Aware of dr Jacalyn Lefevre recommendations. Follow up scheduled

## 2024-09-04 ENCOUNTER — Ambulatory Visit: Attending: Cardiology | Admitting: Cardiology

## 2024-09-04 ENCOUNTER — Encounter: Payer: Self-pay | Admitting: Cardiology

## 2024-09-04 VITALS — BP 110/58 | HR 69 | Ht 66.0 in | Wt 200.0 lb

## 2024-09-04 DIAGNOSIS — I2510 Atherosclerotic heart disease of native coronary artery without angina pectoris: Secondary | ICD-10-CM

## 2024-09-04 DIAGNOSIS — I351 Nonrheumatic aortic (valve) insufficiency: Secondary | ICD-10-CM

## 2024-09-04 DIAGNOSIS — I712 Thoracic aortic aneurysm, without rupture, unspecified: Secondary | ICD-10-CM

## 2024-09-04 DIAGNOSIS — I10 Essential (primary) hypertension: Secondary | ICD-10-CM

## 2024-09-04 DIAGNOSIS — I5020 Unspecified systolic (congestive) heart failure: Secondary | ICD-10-CM | POA: Diagnosis not present

## 2024-09-04 DIAGNOSIS — E785 Hyperlipidemia, unspecified: Secondary | ICD-10-CM

## 2024-09-04 LAB — CBC
Hematocrit: 43.4 % (ref 37.5–51.0)
Hemoglobin: 14.5 g/dL (ref 13.0–17.7)
MCH: 31 pg (ref 26.6–33.0)
MCHC: 33.4 g/dL (ref 31.5–35.7)
MCV: 93 fL (ref 79–97)
Platelets: 303 x10E3/uL (ref 150–450)
RBC: 4.67 x10E6/uL (ref 4.14–5.80)
RDW: 13.2 % (ref 11.6–15.4)
WBC: 7.2 x10E3/uL (ref 3.4–10.8)

## 2024-09-04 LAB — BASIC METABOLIC PANEL WITH GFR
BUN/Creatinine Ratio: 19 (ref 10–24)
BUN: 21 mg/dL (ref 8–27)
CO2: 26 mmol/L (ref 20–29)
Calcium: 10.1 mg/dL (ref 8.6–10.2)
Chloride: 101 mmol/L (ref 96–106)
Creatinine, Ser: 1.11 mg/dL (ref 0.76–1.27)
Glucose: 88 mg/dL (ref 70–99)
Potassium: 4.7 mmol/L (ref 3.5–5.2)
Sodium: 140 mmol/L (ref 134–144)
eGFR: 65 mL/min/1.73 (ref >59)

## 2024-09-04 MED ORDER — METOPROLOL SUCCINATE ER 25 MG PO TB24: 12.5000 mg | ORAL_TABLET | Freq: Every evening | ORAL | 3 refills | Status: AC

## 2024-09-04 MED ORDER — FUROSEMIDE 20 MG PO TABS: 20.0000 mg | ORAL_TABLET | ORAL | 3 refills | Status: AC

## 2024-09-04 MED ORDER — LOSARTAN POTASSIUM 25 MG PO TABS: 12.5000 mg | ORAL_TABLET | Freq: Every morning | ORAL | 3 refills | Status: AC

## 2024-09-04 NOTE — Patient Instructions (Addendum)
 Medication Instructions:  START TAKING YOUR LOSARTAN  12.5 MG IN THE MORNING. START TAKING YOUR METOPROLOL  SUCCINATE 12.5 MG AT NIGHT. START TAKING YOUR LASIX  20 MG EVERY OTHER DAY.  Lab Work: CBC AND BMET TO BE DONE TODAY.  Testing/Procedures: NONE  Follow-Up: At [facility], you and your health needs are our priority.  As part of our continuing mission to provide you with exceptional heart care, our providers are all part of one team.  This team includes your primary Cardiologist (physician) and Advanced Practice Providers or APPs (Physician Assistants and Nurse Practitioners) who all work together to provide you with the care you need, when you need it.  Your next appointment:   POST ECHO IN FEBRUARY 2026  Provider:   Redell Shallow, MD

## 2024-09-05 ENCOUNTER — Ambulatory Visit: Payer: Self-pay | Admitting: Cardiology

## 2024-09-05 ENCOUNTER — Encounter: Payer: Self-pay | Admitting: Cardiology

## 2024-09-07 NOTE — Telephone Encounter (Signed)
 S/W patients son (okay per DPR) and reviewed lab results. Patients son voiced understanding.

## 2024-11-04 ENCOUNTER — Ambulatory Visit (HOSPITAL_COMMUNITY)
Admission: RE | Admit: 2024-11-04 | Discharge: 2024-11-04 | Disposition: A | Source: Ambulatory Visit | Attending: Emergency Medicine | Admitting: Emergency Medicine

## 2024-11-04 DIAGNOSIS — I10 Essential (primary) hypertension: Secondary | ICD-10-CM

## 2024-11-04 DIAGNOSIS — I2510 Atherosclerotic heart disease of native coronary artery without angina pectoris: Secondary | ICD-10-CM

## 2024-11-04 DIAGNOSIS — I5020 Unspecified systolic (congestive) heart failure: Secondary | ICD-10-CM

## 2024-11-04 LAB — ECHOCARDIOGRAM COMPLETE
Area-P 1/2: 3.27 cm2
P 1/2 time: 213 ms
S' Lateral: 3.61 cm

## 2024-11-05 ENCOUNTER — Ambulatory Visit: Payer: Self-pay | Admitting: Emergency Medicine

## 2024-11-11 ENCOUNTER — Ambulatory Visit: Admitting: Cardiology
